# Patient Record
Sex: Male | Born: 1960 | Race: White | Hispanic: No | Marital: Single | Smoking: Current every day smoker
Health system: Southern US, Community
[De-identification: ages and names within clinical notes are randomized; demographics above are authoritative.]

## PROBLEM LIST (undated history)

## (undated) DIAGNOSIS — I10 Essential (primary) hypertension: Secondary | ICD-10-CM

## (undated) DIAGNOSIS — S069XAA Unspecified intracranial injury with loss of consciousness status unknown, initial encounter: Secondary | ICD-10-CM

## (undated) DIAGNOSIS — F319 Bipolar disorder, unspecified: Secondary | ICD-10-CM

## (undated) DIAGNOSIS — S069X9A Unspecified intracranial injury with loss of consciousness of unspecified duration, initial encounter: Secondary | ICD-10-CM

## (undated) HISTORY — PX: APPENDECTOMY: SHX54

## (undated) HISTORY — PX: HIP FRACTURE SURGERY: SHX118

---

## 2008-09-03 ENCOUNTER — Inpatient Hospital Stay (HOSPITAL_COMMUNITY): Admission: RE | Admit: 2008-09-03 | Discharge: 2008-09-10 | Payer: Self-pay | Admitting: Psychiatry

## 2008-09-03 ENCOUNTER — Ambulatory Visit: Payer: Self-pay | Admitting: Psychiatry

## 2008-12-01 ENCOUNTER — Emergency Department (HOSPITAL_COMMUNITY): Admission: EM | Admit: 2008-12-01 | Discharge: 2008-12-02 | Payer: Self-pay | Admitting: Emergency Medicine

## 2010-05-09 ENCOUNTER — Ambulatory Visit: Payer: Self-pay | Admitting: Internal Medicine

## 2010-05-09 ENCOUNTER — Encounter (INDEPENDENT_AMBULATORY_CARE_PROVIDER_SITE_OTHER): Payer: Self-pay | Admitting: Family Medicine

## 2010-05-09 LAB — CONVERTED CEMR LAB
ALT: 15 units/L (ref 0–53)
AST: 21 units/L (ref 0–37)
Albumin: 4.2 g/dL (ref 3.5–5.2)
Alkaline Phosphatase: 67 units/L (ref 39–117)
BUN: 14 mg/dL (ref 6–23)
Basophils Absolute: 0.1 10*3/uL (ref 0.0–0.1)
Basophils Relative: 1 % (ref 0–1)
CO2: 26 meq/L (ref 19–32)
Calcium: 9.5 mg/dL (ref 8.4–10.5)
Chloride: 105 meq/L (ref 96–112)
Creatinine, Ser: 0.96 mg/dL (ref 0.40–1.50)
Eosinophils Absolute: 0.2 10*3/uL (ref 0.0–0.7)
Eosinophils Relative: 3 % (ref 0–5)
Glucose, Bld: 95 mg/dL (ref 70–99)
HCT: 41.3 % (ref 39.0–52.0)
Hemoglobin: 13.9 g/dL (ref 13.0–17.0)
Lymphocytes Relative: 31 % (ref 12–46)
Lymphs Abs: 2.2 10*3/uL (ref 0.7–4.0)
MCHC: 33.7 g/dL (ref 30.0–36.0)
MCV: 99.5 fL (ref 78.0–100.0)
Monocytes Absolute: 0.6 10*3/uL (ref 0.1–1.0)
Monocytes Relative: 9 % (ref 3–12)
Neutro Abs: 3.9 10*3/uL (ref 1.7–7.7)
Neutrophils Relative %: 56 % (ref 43–77)
Platelets: 233 10*3/uL (ref 150–400)
Potassium: 4.3 meq/L (ref 3.5–5.3)
RBC: 4.15 M/uL — ABNORMAL LOW (ref 4.22–5.81)
RDW: 13.1 % (ref 11.5–15.5)
Sodium: 142 meq/L (ref 135–145)
Total Bilirubin: 0.5 mg/dL (ref 0.3–1.2)
Total Protein: 6.7 g/dL (ref 6.0–8.3)
WBC: 6.9 10*3/uL (ref 4.0–10.5)

## 2010-05-10 ENCOUNTER — Ambulatory Visit (HOSPITAL_COMMUNITY): Admission: RE | Admit: 2010-05-10 | Discharge: 2010-05-10 | Payer: Self-pay | Admitting: Family Medicine

## 2010-05-17 ENCOUNTER — Ambulatory Visit: Payer: Self-pay | Admitting: Family Medicine

## 2010-05-19 ENCOUNTER — Ambulatory Visit: Admission: RE | Admit: 2010-05-19 | Discharge: 2010-05-19 | Payer: Self-pay | Admitting: Family Medicine

## 2010-05-19 ENCOUNTER — Encounter (INDEPENDENT_AMBULATORY_CARE_PROVIDER_SITE_OTHER): Payer: Self-pay | Admitting: Family Medicine

## 2010-05-19 ENCOUNTER — Ambulatory Visit: Payer: Self-pay | Admitting: Surgery

## 2010-06-05 ENCOUNTER — Ambulatory Visit: Payer: Self-pay | Admitting: Family Medicine

## 2010-06-15 ENCOUNTER — Encounter: Admission: RE | Admit: 2010-06-15 | Discharge: 2010-07-10 | Payer: Self-pay | Admitting: Family Medicine

## 2010-06-22 ENCOUNTER — Ambulatory Visit: Payer: Self-pay | Admitting: Internal Medicine

## 2010-06-22 ENCOUNTER — Encounter (INDEPENDENT_AMBULATORY_CARE_PROVIDER_SITE_OTHER): Payer: Self-pay | Admitting: Family Medicine

## 2010-06-22 LAB — CONVERTED CEMR LAB: Microalb, Ur: 2.41 mg/dL — ABNORMAL HIGH (ref 0.00–1.89)

## 2010-08-30 ENCOUNTER — Ambulatory Visit: Payer: Self-pay | Admitting: Internal Medicine

## 2010-10-30 ENCOUNTER — Emergency Department (HOSPITAL_COMMUNITY)
Admission: EM | Admit: 2010-10-30 | Discharge: 2010-10-30 | Payer: Self-pay | Source: Home / Self Care | Admitting: Emergency Medicine

## 2010-11-23 ENCOUNTER — Encounter
Admission: RE | Admit: 2010-11-23 | Discharge: 2010-11-23 | Payer: Self-pay | Source: Home / Self Care | Attending: Otolaryngology | Admitting: Otolaryngology

## 2010-11-28 ENCOUNTER — Ambulatory Visit (HOSPITAL_COMMUNITY)
Admission: RE | Admit: 2010-11-28 | Discharge: 2010-11-28 | Payer: Self-pay | Source: Home / Self Care | Attending: Surgery | Admitting: Surgery

## 2010-11-29 LAB — DIFFERENTIAL
Basophils Absolute: 0.1 10*3/uL (ref 0.0–0.1)
Basophils Relative: 1 % (ref 0–1)
Eosinophils Absolute: 0.2 10*3/uL (ref 0.0–0.7)
Eosinophils Relative: 3 % (ref 0–5)
Lymphocytes Relative: 33 % (ref 12–46)
Lymphs Abs: 2.5 10*3/uL (ref 0.7–4.0)
Monocytes Absolute: 0.6 10*3/uL (ref 0.1–1.0)
Monocytes Relative: 8 % (ref 3–12)
Neutro Abs: 4.2 10*3/uL (ref 1.7–7.7)
Neutrophils Relative %: 55 % (ref 43–77)

## 2010-11-29 LAB — BASIC METABOLIC PANEL
BUN: 13 mg/dL (ref 6–23)
CO2: 26 mEq/L (ref 19–32)
Calcium: 9.7 mg/dL (ref 8.4–10.5)
Chloride: 103 mEq/L (ref 96–112)
Creatinine, Ser: 0.84 mg/dL (ref 0.4–1.5)
GFR calc Af Amer: >60 mL/min (ref >60)
GFR calc non Af Amer: >60 mL/min (ref >60)
Glucose, Bld: 89 mg/dL (ref 70–99)
Potassium: 5.2 mEq/L — ABNORMAL HIGH (ref 3.5–5.1)
Sodium: 137 mEq/L (ref 135–145)

## 2010-11-29 LAB — CBC
HCT: 43.9 % (ref 39.0–52.0)
Hemoglobin: 14.9 g/dL (ref 13.0–17.0)
MCH: 33.3 pg (ref 26.0–34.0)
MCHC: 33.9 g/dL (ref 30.0–36.0)
MCV: 98 fL (ref 78.0–100.0)
Platelets: 243 10*3/uL (ref 150–400)
RBC: 4.48 MIL/uL (ref 4.22–5.81)
RDW: 13.2 % (ref 11.5–15.5)
WBC: 7.7 10*3/uL (ref 4.0–10.5)

## 2010-11-29 LAB — HEPATIC FUNCTION PANEL
ALT: 18 U/L (ref 0–53)
AST: 17 U/L (ref 0–37)
Albumin: 4.1 g/dL (ref 3.5–5.2)
Alkaline Phosphatase: 54 U/L (ref 39–117)
Bilirubin, Direct: <0.1 mg/dL (ref 0.0–0.3)
Total Bilirubin: 0.4 mg/dL (ref 0.3–1.2)
Total Protein: 6.9 g/dL (ref 6.0–8.3)

## 2010-11-29 LAB — SURGICAL PCR SCREEN
MRSA, PCR: NEGATIVE
Staphylococcus aureus: NEGATIVE

## 2010-12-15 ENCOUNTER — Ambulatory Visit (HOSPITAL_COMMUNITY)
Admission: RE | Admit: 2010-12-15 | Discharge: 2010-12-15 | Disposition: A | Discharge status: Home / Self Care | Payer: Medicaid Other | Attending: Surgery | Admitting: Surgery

## 2010-12-15 ENCOUNTER — Other Ambulatory Visit: Payer: Self-pay | Admitting: Surgery

## 2010-12-15 ENCOUNTER — Ambulatory Visit (HOSPITAL_COMMUNITY)
Admission: RE | Admit: 2010-12-15 | Discharge: 2010-12-15 | Disposition: A | Discharge status: Home / Self Care | Payer: Medicaid Other | Source: Ambulatory Visit | Attending: Surgery | Admitting: Surgery

## 2010-12-15 DIAGNOSIS — K4090 Unilateral inguinal hernia, without obstruction or gangrene, not specified as recurrent: Secondary | ICD-10-CM | POA: Insufficient documentation

## 2010-12-15 DIAGNOSIS — R229 Localized swelling, mass and lump, unspecified: Secondary | ICD-10-CM | POA: Insufficient documentation

## 2010-12-15 DIAGNOSIS — L723 Sebaceous cyst: Secondary | ICD-10-CM | POA: Insufficient documentation

## 2010-12-15 DIAGNOSIS — F17200 Nicotine dependence, unspecified, uncomplicated: Secondary | ICD-10-CM | POA: Insufficient documentation

## 2010-12-15 DIAGNOSIS — F319 Bipolar disorder, unspecified: Secondary | ICD-10-CM | POA: Insufficient documentation

## 2010-12-15 LAB — RAPID URINE DRUG SCREEN, HOSP PERFORMED
Amphetamines: NOT DETECTED
Barbiturates: NOT DETECTED
Benzodiazepines: NOT DETECTED
Cocaine: NOT DETECTED
Opiates: NOT DETECTED
Tetrahydrocannabinol: POSITIVE — AB

## 2010-12-19 NOTE — Op Note (Signed)
NAME:  Johnny Banks, Johnny Banks NO.:  [id number]  MEDICAL RECORD NO.:  [id number]           PATIENT TYPE:  O  LOCATION:  DAYL                         FACILITY:  [facility]  PHYSICIAN:  Ardeth Sportsman, MD     DATE OF BIRTH:  [birth year]  DATE OF PROCEDURE:  12/15/2010 DATE OF DISCHARGE:                              OPERATIVE REPORT   PRIMARY CARE PHYSICIAN:  Over at [facility], who I believe is Dr. Syliva Overman.  SURGEON:  Ardeth Sportsman, MD  ASSISTANTS:  RN.  PREOPERATIVE DIAGNOSES: 1. Recurrent enlarged back mass,  question sebaceous cyst versus     lipoma. 2. Right inguinal hernia. 3. Possible left inguinal hernia.  POSTOPERATIVE DIAGNOSES: 1. Recurrent back mass, cystic versus sebaceous cyst. 2. Bilateral indirect inguinal hernia.  PROCEDURE PERFORMED: 1. Excision of back mass with closure. 2. Laparoscopic bilateral inguinal hernia repair with mesh.  ANESTHESIA: 1. General anesthesia. 2. Local anesthetic and field block around the back incision site and     port sites. 3. Bilateral ilioinguinal/genitofemoral/spermatic cord nerve block.  SPECIMENS:  Back mass.  DRAINS:  None.  ESTIMATED BLOOD LOSS:  50 mL.  COMPLICATIONS:  None major.  INDICATIONS:  The patient is a 50 year old male.  He was noted to have an incidental right inguinal hernia on a screening physical exam.  It was not  particularly bothersome; however, over the past few months, it has become symptomatic.  He feels like it got larger  as well.  The patient was sent to me for surgical evaluation.  I suspect that he had a left inguinal hernia in addition to the obvious right inguinal hernia and the anatomy and embryology of abdominal inflammation and testicular migration was explained.  Pathophysiology of herniation with its natural history and risks were discussed.  Options discussed. Recommendations made for laparoscopic bilateral inguinal exploration with repair with mesh .  Risks,  benefits, and alternatives were discussed.  The patient also notes he has had a mass on his back that drained  back in the 1990s.  It calmed down.  However, he notes,  it has increased in size and become uncomfortable.  He has not had any recent infections. He wondered about __________.  I suspect he had a recurrent, large sebaceous cyst versus a lipoma.  Technique of excision, increased risk of infection, benefits, and alternatives were discussed.  Questions were answered and the patient agreed to proceed.  OPERATIVE FINDINGS:  He had a 5 x 3 x 3 cm multiloculated cystic mass consistent with a large sebaceous cyst, it involved the skin and dermis, and it was not actively infected.  He had bilateral indirect inguinal  hernia with moderate-sized cord lipomas on the right, but much smaller on the left.  There were no direct femoral or obturator defects.  DESCRIPTION OF THE PROCEDURE:  Informed consent was confirmed.  The patient underwent general anesthesia without any difficulty.  He voided just prior coming to the operating room.  He received IV cefazolin  and gentamicin given the __________.  He initially was positioned right side decubitus.  His back was prepped  and draped in a sterile fashion. Surgical time-out confirmed our plan.  I made an elliptical excision over the mass  transversely.  I tried to spare  some skin, but the mass was definitely  adhered  into the dermal tissues.  Therefore, I had to extend the incision with a wider.  The resulting  wound was 7 x 4 cm.  I had to make somewhat  of a sigmoid shape to it  given that it was not completely transversed, but somewhat oblique and the mass was quite dense onto the skin.  I did  remove it intact.  I got around it cleanly. I ensured hemostasis.  I closed the wound with 3-0 Vicryl interrupted deep dermal stitches and 4-0 Monocryl running stitch with following Steri-Strips.  Sterile dressings applied.  The patient was  rolled supine with arms tucked.  His abdomen and mons pubis were clipped,  prepped and draped in a sterile fashion.  Surgical time-out confirmed our plan.  I placed a 12-mm Hasson port in the preperitoneal plane just to left of midline, underneath the rectus muscle.  I used carbon dioxide insufflation into the preperitoneal space.  I used a camera to free the peritoneum off bilateral lower quadrants.  I created enough working space such as 5 mm port can be placed in the right and left mid abdomen.  We turned our attention towards the right lower quadrant since that was the obvious hernia side.  I freed the peritoneum off laterally  towards the right flank.  I freed it up to the arcuate ligament and actually I freed a little bit of that off just to get a little bit higher and mid peritoneum freed off as well.  I freed the peritoneum down to the pubic rim.  I made a window between the right anterolateral bladder and the true right pelvis.  I could see some of the peritoneum going into a dilated internal ring consistent with an indirect inguinal hernia.  I skeletonized this peritoneal hernia sac and freed that off the spermatic cord vessels.  He had some moderate-sized cord lipomas associated, which I was able to remove and morcellate, and freed it off, was somewhat adherent and the right spermatic cord got little more skeletonized on the venous vessels, but little bit oozy at the side branches.  However, with pressure, time and focus touch point cautery, hemostasis was excellent.  I freed the peritoneum off vas deferens as well.  Next, I carried out dissection on the left side in a mirror-image fashion.  He had more subtle defect on the left side in a mirror-image fashion.  He had a more subtle defect on the left side but he did have a definite indirect defect.  I did camera inspection and saw no evidence of the femoral or obturator foramen or hernia defects.  The direct space was  clean.  I placed 15 x 15 cm ultra-lightweight polypropylene (Ultrapro) mesh on each side.  I cut a slid in the mesh such that a 6 x 6 cm mesh corner was tucked between the lateral pelvic side wall and the lateral bladder. The mesh was laid like overlapping diamonds with the medial corners overlapping across the midline near the superior rami pubis.  The mesh laid laterally and superiorly as well such that it had at least 3 inches circumferential cover around the both internal rings.  The direct space in femoral and obturator foramen were covered with the mesh as well. Because they were not  particularly large hernias and the mesh laid well, I did not had any tacks.  I grabbed the hernia sac and elevated it cephalad.  I tucked the corners of the mesh, especially the most inferior corner down on the psoas and evacuated capnoperitoneum ports.  I closed the infraumbilical  fascial defect using 0 Vicryl stitch.  Skin was closed.  Sterile dressings were applied.  The patient was being extubated and sent to recovery room.  He has had difficulty getting a ride home as his first option fell through.  I have no family members to discuss with him.  We will monitor and see if he is stable.  Depending on availability of his ride, he may get stay overnight, but hopefully he could go home later tonight as long as he is comfortable.  I did discuss postop care with him in the office and again with him just prior to surgery.  I have written instructions as well.     Ardeth Sportsman, MD     SCG/MEDQ  D:  12/15/2010  T:  12/16/2010  Job:  [id number]  Electronically Signed by Karie Soda MD on 12/19/2010 10:43:36 AM

## 2011-02-26 LAB — URINALYSIS, ROUTINE W REFLEX MICROSCOPIC
Bilirubin Urine: NEGATIVE
Glucose, UA: NEGATIVE mg/dL
Hgb urine dipstick: NEGATIVE
Ketones, ur: NEGATIVE mg/dL
Nitrite: NEGATIVE
Protein, ur: NEGATIVE mg/dL
Specific Gravity, Urine: 1.021 (ref 1.005–1.030)
Urobilinogen, UA: 0.2 mg/dL (ref 0.0–1.0)
pH: 5.5 (ref 5.0–8.0)

## 2011-02-26 LAB — COMPREHENSIVE METABOLIC PANEL
ALT: 27 U/L (ref 0–53)
AST: 28 U/L (ref 0–37)
Albumin: 3.9 g/dL (ref 3.5–5.2)
Alkaline Phosphatase: 62 U/L (ref 39–117)
BUN: 11 mg/dL (ref 6–23)
CO2: 22 mEq/L (ref 19–32)
Calcium: 8.7 mg/dL (ref 8.4–10.5)
Chloride: 106 mEq/L (ref 96–112)
Creatinine, Ser: 0.8 mg/dL (ref 0.4–1.5)
GFR calc Af Amer: >60 mL/min (ref >60)
GFR calc non Af Amer: >60 mL/min (ref >60)
Glucose, Bld: 120 mg/dL — ABNORMAL HIGH (ref 70–99)
Potassium: 3.3 mEq/L — ABNORMAL LOW (ref 3.5–5.1)
Sodium: 140 mEq/L (ref 135–145)
Total Bilirubin: 0.8 mg/dL (ref 0.3–1.2)
Total Protein: 7.1 g/dL (ref 6.0–8.3)

## 2011-02-26 LAB — RAPID URINE DRUG SCREEN, HOSP PERFORMED
Amphetamines: NOT DETECTED
Barbiturates: NOT DETECTED
Benzodiazepines: NOT DETECTED
Cocaine: NOT DETECTED
Opiates: NOT DETECTED
Tetrahydrocannabinol: POSITIVE — AB

## 2011-02-26 LAB — DIFFERENTIAL
Basophils Absolute: 0.1 10*3/uL (ref 0.0–0.1)
Basophils Relative: 1 % (ref 0–1)
Eosinophils Absolute: 0.2 10*3/uL (ref 0.0–0.7)
Eosinophils Relative: 3 % (ref 0–5)
Lymphocytes Relative: 43 % (ref 12–46)
Lymphs Abs: 3.2 10*3/uL (ref 0.7–4.0)
Monocytes Absolute: 0.6 10*3/uL (ref 0.1–1.0)
Monocytes Relative: 8 % (ref 3–12)
Neutro Abs: 3.3 10*3/uL (ref 1.7–7.7)
Neutrophils Relative %: 45 % (ref 43–77)

## 2011-02-26 LAB — CBC
HCT: 44 % (ref 39.0–52.0)
Hemoglobin: 15.3 g/dL (ref 13.0–17.0)
MCHC: 34.8 g/dL (ref 30.0–36.0)
MCV: 93.9 fL (ref 78.0–100.0)
Platelets: 247 10*3/uL (ref 150–400)
RBC: 4.69 MIL/uL (ref 4.22–5.81)
RDW: 14.2 % (ref 11.5–15.5)
WBC: 7.5 10*3/uL (ref 4.0–10.5)

## 2011-02-26 LAB — LIPASE, BLOOD: Lipase: 24 U/L (ref 11–59)

## 2011-02-26 LAB — ETHANOL: Alcohol, Ethyl (B): 117 mg/dL — ABNORMAL HIGH (ref 0–10)

## 2011-03-27 ENCOUNTER — Emergency Department (HOSPITAL_COMMUNITY)
Admission: EM | Admit: 2011-03-27 | Discharge: 2011-03-27 | Disposition: A | Discharge status: Home / Self Care | Payer: Medicaid Other | Attending: Emergency Medicine | Admitting: Emergency Medicine

## 2011-03-27 ENCOUNTER — Emergency Department (HOSPITAL_COMMUNITY): Payer: Medicaid Other

## 2011-03-27 DIAGNOSIS — S61209A Unspecified open wound of unspecified finger without damage to nail, initial encounter: Secondary | ICD-10-CM | POA: Insufficient documentation

## 2011-03-27 DIAGNOSIS — W261XXA Contact with sword or dagger, initial encounter: Secondary | ICD-10-CM | POA: Insufficient documentation

## 2011-03-27 DIAGNOSIS — Y92009 Unspecified place in unspecified non-institutional (private) residence as the place of occurrence of the external cause: Secondary | ICD-10-CM | POA: Insufficient documentation

## 2011-03-27 DIAGNOSIS — F411 Generalized anxiety disorder: Secondary | ICD-10-CM | POA: Insufficient documentation

## 2011-03-27 DIAGNOSIS — Y998 Other external cause status: Secondary | ICD-10-CM | POA: Insufficient documentation

## 2011-03-27 DIAGNOSIS — W260XXA Contact with knife, initial encounter: Secondary | ICD-10-CM | POA: Insufficient documentation

## 2011-03-27 DIAGNOSIS — F3289 Other specified depressive episodes: Secondary | ICD-10-CM | POA: Insufficient documentation

## 2011-03-27 DIAGNOSIS — F602 Antisocial personality disorder: Secondary | ICD-10-CM | POA: Insufficient documentation

## 2011-03-27 DIAGNOSIS — F329 Major depressive disorder, single episode, unspecified: Secondary | ICD-10-CM | POA: Insufficient documentation

## 2011-03-27 DIAGNOSIS — Z96649 Presence of unspecified artificial hip joint: Secondary | ICD-10-CM | POA: Insufficient documentation

## 2011-03-27 DIAGNOSIS — F429 Obsessive-compulsive disorder, unspecified: Secondary | ICD-10-CM | POA: Insufficient documentation

## 2011-03-27 NOTE — H&P (Signed)
NAME:  Johnny Banks, Johnny Banks NO.:  [id number]   MEDICAL RECORD NO.:  [id number]          PATIENT TYPE:  IPS   LOCATION:  0503                          [facility]ACILITY:  [facility]   PHYSICIAN:  Vic Ripper, P.A.-C.DATE OF BIRTH:  [birth year]   DATE OF ADMISSION:  09/03/2008  DATE OF DISCHARGE:                       PSYCHIATRIC ADMISSION ASSESSMENT   HISTORY OF PRESENT ILLNESS:  This is a 50 year old single white male.  He presented to [facility] yesterday.  He reported  that he was suicidal with a plan to drink himself to death.  He had been  sober for the past 60 days.  He relapsed yesterday after getting into an  argument with his girlfriend.  He reported that he was depressed,  hopeless and helpless.  He has attended numerous rehab centers  throughout his lifetime.  The most recent being in August 2009, he  attended [facility] program through the [facility].  Today, he reports that he is no longer suicidal.  He is upset with his  girlfriend as she no longer has time for him.  He states that originally  they were all over each other, and now she does not have time for him.   PAST PSYCHIATRIC HISTORY:  As already stated, he has had numerous prior  rehabs.  He has taken antidepressants for years according to him.  He  last had rehab at [facility] through [facility] in August 2009.   SOCIAL HISTORY:  He is a high Garment/textile technologist in 1978.  He has never  married.  He has no children.  He is currently employed as a  Designer, multimedia.   FAMILY HISTORY:  He states his father and brother both have alcohol  issues.  However, he has not spoken to them in at least 10 years.  His  own alcohol and drug history reports having been sober for 60 days, but  he relapsed yesterday on alcohol.   PRIMARY CARE PHYSICIAN:  He does not have a PCC.  He has no  psychiatrist.   PAST MEDICAL HISTORY:  He is known to have high cholesterol.   MEDICATIONS:  He is  currently prescribed:  1. Paxil 40 mg p.o. daily.  2. Wellbutrin SR 150 mg p.o. a.m. and noon.  3. Zestril 10 mg p.o. daily.  4. Trazodone 100 mg q.h.s.   ALLERGIES:  NO KNOWN DRUG ALLERGIES.   POSITIVE PHYSICAL FINDINGS:  We are waiting on some of his lab results,  specifically a UDS and a UA.  His WBC was elevated at 13.0 on his CBC,  and he states he is recovering from a cold, upper respiratory infection.  The remainder of his physical examination is unremarkable.  He is a well-  developed, well-nourished white male who appears his stated age.  VITAL SIGNS:  On admission, shows he is 6 feet 1 inches, weight 244,  temperature 98, blood pressure 87/58 to 97/62, pulse 80-92, respirations  18.  He does have a scar in his right lower quadrant and consistent with  his prior history for an appendectomy, and he has a  cyst in his mid  upper back.   MENTAL STATUS EXAM:  Today he was examined in his room.  He is alert and  oriented.  His speech is normal rate, rhythm and tone.  His mood is  appropriate to the situation.  His affect is congruent.  Thought  processes are clear, rational and goal oriented.  He wants to get detox  so he can keep his appointment.  Judgment and insight are fair.  Concentration memory are intact.  Intelligence is average.  He denies  any suicidal or homicidal.  He denies auditory visual hallucinations.   DIAGNOSES:  AXIS I:  Alcohol abuse, major depressive disorder, recurrent  severe.  AXIS II:  Personality disorder, NOS.  AXIS III:  High cholesterol.  AXIS IV:  Issues with alcohol and problems with primary support group.  AXIS V:  30.   PLAN:  Admit for safety and stabilization.  We will help him detox,  utilizing Librium 25 mg q.6 h p.r.n. for 48 hours, and estimated length  of stay is 48 hours.  He can continue follow up with the [facility].      Vic Ripper, P.A.-C.     MD/MEDQ  D:  09/04/2008  T:  09/04/2008  Job:  [id number]

## 2011-03-30 NOTE — Discharge Summary (Signed)
NAME:  Johnny Banks, Johnny Banks NO.:  [id number]   MEDICAL RECORD NO.:  [id number]          PATIENT TYPE:  IPS   LOCATION:  0503                          FACILITY:  [facility]   PHYSICIAN:  Geoffery Lyons, M.D.      DATE OF BIRTH:  February 20, 1961   DATE OF ADMISSION:  09/03/2008  DATE OF DISCHARGE:  09/10/2008                               DISCHARGE SUMMARY   CHIEF COMPLAINT AND PRESENT ILLNESS:  This was the first admission to  [facility] for this 50 year old single white male,  presented to here for [facility] Report he was suicidal with a  plan to drink himself to death.  Had been sober for the past 16 days.  Relapsed after getting into an argument with his girlfriend.  Reported  he was depressed, hopeless, helpless, attended numerous rehab centers  throughout his lifetime.  Was re-seen in August 2009, he had attended  __________.  At the time of this assessment endorsed that he was no  longer suicidal, upset with his girlfriend as she no longer has time for  him.  Originally they were all over each other and now she does not have  time.   PAST PSYCHIATRIC HISTORY:  Numerous prior rehabs, has taken  antidepressants for years.  Had been in rehab in __________ in August of  2009.   SECONDARY HISTORY:  As already stated, history of persistent alcohol use  with some sobriety in between.   MEDICAL HISTORY:  Hypercholesterolemia.   MEDICATIONS:  1. Paxil 40 mg per day.  2. Wellbutrin SR 150 mg twice a day.  3. Zestril 10 mg per day.  4. Trazodone 100 mg at bedtime.   PHYSICAL EXAMINATION:  Failed to show any acute findings.   LABORATORY WORKUP:  White blood cells 13, hemoglobin 15.1, sodium 137,  potassium 4.3, glucose 104, SGOT 19, SGPT 14, total bilirubin 0.6.  Drug  screen negative for substances of abuse.   MENTAL STATUS EXAM:  Reveals an alert, cooperative male.  Speech normal  in rate, rhythm and tone.  Mood is anxious, affect is anxious,  thought  process logical, coherent and relevant.  Wants to get detox, wants to  work on getting back in a relationship with girlfriend, wants to go back  into halfway house.  No suicidal or homicidal ideas, no hallucinations  or delusions.  Cognition well-preserved.   ADMISSION DIAGNOSES:  AXIS I:  Alcohol dependence, depressive disorder  not otherwise specified.  AXIS II:  No diagnosis.  AXIS III:  Hypercholesterolemia.  AXIS IV:  Moderate.  AXIS V:  Upon Admission 30 - 35, high Global Assessment of Functioning  in the last year 60.   COURSE IN THE HOSPITAL:  Was admitted, started individual and group  psychotherapy.  As already stated he was living in a recovery house 2 -  1/2 months.  Endorsed things were going smooth.  There was conflict with  the girlfriend, she is also in recovery.  He met her at __________ .  She stopped going to AA and he felt that he was  put on the back burner.  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  He had been in  6 different rehabs and 6 different recovery houses.  October 25 he was  trying to interact with the girlfriend but unable to do so, trying to  get someone in the house to see if he is able to go back to stay in the  recovery house after having relapsed.  Wanted to be able to go to an  appointment with vocational rehab, that he was that he had counseled  several times.  Felt that he was not going to be serviced if he was not  there the following day.  He endorsed no cravings and endorsed that  drinking is the last thing in his mind.  October the 26, able to share a  long history of anger and mood swings, now dealing with uncertainty of  having a place to go, uncertainty in terms of the relationship with the  girlfriend, not sure what will happen.  He perceived mixed messages  coming  from her, endorsed he was really committed to the relationship  and needed a commitment from her but  as of now does not feel that he  has that commitment.  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  October 27 he was easily agitated, decided to end the relationship.  Endorsed that he would not be able to be in a relationship with her if  they were not going to be a couple.  Continued to work on letting go of  the relationship with the girlfriend, still there he expressed some hope  but she did not return calls or came to see him as he expected.  October  29 he was endorsing persistent worries, ruminations and it is clear that  he could not take benzodiazepines.  We discussed options, so we gave him  a trial to Seroquel and Neurontin.  By October 30 he was in full contact  with reality.  There were no active suicidal or homicidal ideas, no  hallucinations or delusions, had tolerated the medications well.  He was  he was accepted back to the recovery house and he was not going to  pursue a relationship with the girlfriend.   DISCHARGE DIAGNOSIS:  AXIS I:  Alcohol dependence, cocaine abuse, mood  disorder NOS.  AXIS II:  No diagnosis.  AXIS III:  Hypercholesterolemia.  AXIS IV:  Moderate.  AXIS V:  Upon discharge 50 - 55.   Discharged on:  1. Wellbutrin SR 150 twice a day.  2. Prinivil 10 mg per day.  3. Paxil 20 mg two daily.  4. Neurontin 100 mg two 3 times a day.  5. Seroquel 50 one 3 times a day.  6. Trazodone 100 at bedtime.   FOLLOW-UP:  Dr. __________ at [facility].      Geoffery Lyons, M.D.  Electronically Signed     IL/MEDQ  D:  10/08/2008  T:  10/08/2008  Job:  [id number]

## 2011-08-14 LAB — COMPREHENSIVE METABOLIC PANEL
ALT: 14
AST: 19
Albumin: 4
Alkaline Phosphatase: 71
BUN: 15
CO2: 27
Calcium: 9
Chloride: 104
Creatinine, Ser: 0.93
GFR calc Af Amer: >60
GFR calc non Af Amer: >60
Glucose, Bld: 104 — ABNORMAL HIGH
Potassium: 4.3
Sodium: 137
Total Bilirubin: 0.6
Total Protein: 7.3

## 2011-08-14 LAB — URINALYSIS, ROUTINE W REFLEX MICROSCOPIC
Bilirubin Urine: NEGATIVE
Glucose, UA: NEGATIVE
Hgb urine dipstick: NEGATIVE
Ketones, ur: NEGATIVE
Nitrite: NEGATIVE
Protein, ur: NEGATIVE
Specific Gravity, Urine: 1.021
Urobilinogen, UA: 0.2
pH: 6

## 2011-08-14 LAB — DRUGS OF ABUSE SCREEN W/O ALC, ROUTINE URINE
Amphetamine Screen, Ur: NEGATIVE
Barbiturate Quant, Ur: NEGATIVE
Benzodiazepines.: NEGATIVE
Cocaine Metabolites: NEGATIVE
Creatinine,U: 170.7
Marijuana Metabolite: NEGATIVE
Methadone: NEGATIVE
Opiate Screen, Urine: NEGATIVE
Phencyclidine (PCP): NEGATIVE
Propoxyphene: NEGATIVE

## 2011-08-14 LAB — CBC
HCT: 44.8
Hemoglobin: 15.1
MCHC: 33.7
MCV: 94.8
Platelets: 253
RBC: 4.73
RDW: 12.8
WBC: 13 — ABNORMAL HIGH

## 2012-01-16 ENCOUNTER — Other Ambulatory Visit: Payer: Self-pay | Admitting: Otolaryngology

## 2012-01-16 DIAGNOSIS — H6620 Chronic atticoantral suppurative otitis media, unspecified ear: Secondary | ICD-10-CM

## 2012-01-18 ENCOUNTER — Other Ambulatory Visit: Payer: Medicaid Other

## 2012-01-23 ENCOUNTER — Ambulatory Visit
Admission: RE | Admit: 2012-01-23 | Discharge: 2012-01-23 | Disposition: A | Discharge status: Home / Self Care | Payer: Medicaid Other | Source: Ambulatory Visit | Attending: Otolaryngology | Admitting: Otolaryngology

## 2012-01-23 DIAGNOSIS — H6620 Chronic atticoantral suppurative otitis media, unspecified ear: Secondary | ICD-10-CM

## 2012-04-01 ENCOUNTER — Emergency Department (HOSPITAL_COMMUNITY)
Admission: EM | Admit: 2012-04-01 | Discharge: 2012-04-01 | Disposition: A | Discharge status: Home / Self Care | Payer: Medicaid Other | Attending: Emergency Medicine | Admitting: Emergency Medicine

## 2012-04-01 ENCOUNTER — Encounter (HOSPITAL_COMMUNITY): Payer: Self-pay | Admitting: *Deleted

## 2012-04-01 DIAGNOSIS — Z760 Encounter for issue of repeat prescription: Secondary | ICD-10-CM

## 2012-04-01 DIAGNOSIS — F319 Bipolar disorder, unspecified: Secondary | ICD-10-CM | POA: Insufficient documentation

## 2012-04-01 HISTORY — DX: Unspecified intracranial injury with loss of consciousness status unknown, initial encounter: S06.9XAA

## 2012-04-01 HISTORY — DX: Unspecified intracranial injury with loss of consciousness of unspecified duration, initial encounter: S06.9X9A

## 2012-04-01 HISTORY — DX: Bipolar disorder, unspecified: F31.9

## 2012-04-01 MED ORDER — PAROXETINE HCL ER 25 MG PO TB24: 25.0000 mg | ORAL_TABLET | ORAL | Status: DC

## 2012-04-01 MED ORDER — QUETIAPINE FUMARATE 100 MG PO TABS: 100.0000 mg | ORAL_TABLET | Freq: Every day | ORAL | Status: DC

## 2012-04-01 NOTE — ED Provider Notes (Signed)
History     CSN: [id number]  Arrival date & time 04/01/12  1334   First MD Initiated Contact with Patient 04/01/12 1343      Chief Complaint  Patient presents with  . Medication Refill  . Medical Clearance     The history is provided by the patient.   the patient reports his been out of his medications for his psychiatric illness x3 days.  He reports he was at the pharmacy trying to get his medications refilled and that he in the pharmacist begin [employer] he meant and [facility] was called to pick him up.  He brought him to the emergency department so that he get prescriptions refilled.  The patient does report he is $6 in his wallet which will allow him to pay his Medicaid to pay for his prescriptions.  He simply has been unable to go see a psychiatrist secondary to calling his psychiatrist money.  He denies homicidal and suicidal thoughts  Past Medical History  Diagnosis Date  . Bipolar 1 disorder   . TBI (traumatic brain injury)     History reviewed. No pertinent past surgical history.  History reviewed. No pertinent family history.  History  Substance Use Topics  . Smoking status: Current Everyday Smoker  . Smokeless tobacco: Not on file  . Alcohol Use: Yes      Review of Systems  All other systems reviewed and are negative.    Allergies  Review of patient's allergies indicates no known allergies.  Home Medications   Current Outpatient Rx  Name Route Sig Dispense Refill  . PAROXETINE HCL ER 25 MG PO TB24 Oral Take 1 tablet (25 mg total) by mouth every morning. 30 tablet 1  . QUETIAPINE FUMARATE 100 MG PO TABS Oral Take 1 tablet (100 mg total) by mouth at bedtime. 30 tablet 1    BP 137/104  Pulse 98  Temp(Src) 98.5 F (36.9 C) (Oral)  Resp 20  SpO2 98%  Physical Exam  Nursing note and vitals reviewed. Constitutional: He is oriented to person, place, and time. He appears well-developed and well-nourished.  HENT:  Head: Normocephalic.    Eyes: EOM are normal.  Neck: Normal range of motion.  Pulmonary/Chest: Effort normal.  Musculoskeletal: Normal range of motion.  Neurological: He is alert and oriented to person, place, and time.  Psychiatric: He has a normal mood and affect. His behavior is normal. Judgment and thought content normal.    ED Course  Procedures (including critical care time)  Labs Reviewed - No data to display No results found.   1. Medication refill       MDM  The patient does not appear to be a threat to himself or to others.  Medication refill was prescribed.  The patient will go and get his meds filled.  He has the $6 for his co-pay.  He is encouraged return to the ER as needed        Lyanne Co, MD 04/01/12 1401

## 2012-04-01 NOTE — Discharge Instructions (Signed)
Medication Refill, Emergency Department  We have refilled your medication today as a courtesy to you. It is best for your medical care, however, to take care of getting refills done through your primary caregiver's office. They have your records and can do a better job of follow-up than we can in the emergency department.  On maintenance medications, we often only prescribe enough medications to get you by until you are able to see your regular caregiver. This is a more expensive way to refill medications.  In the future, please plan for refills so that you will not have to use the emergency department for this.  Thank you for your help. Your help allows us to better take care of the daily emergencies that enter our department.  Document Released: 02/15/2004 Document Revised: 10/18/2011 Document Reviewed: 10/29/2005  Exit[employer] Patient Information 2012 [employer].

## 2012-04-01 NOTE — ED Notes (Signed)
Pt in with [facility] stating he has been off his medication since last Wednesday, states he is unable to get refills, admits to hallucinations and thoughts of hurting self but no desire to do anything, states he can't control his thoughts without his medication and he is seeking assistance with getting his refills, states he does not want inpatient placement

## 2012-05-30 ENCOUNTER — Emergency Department (HOSPITAL_COMMUNITY)
Admission: EM | Admit: 2012-05-30 | Discharge: 2012-05-30 | Disposition: A | Discharge status: Home / Self Care | Payer: Medicaid Other | Attending: Emergency Medicine | Admitting: Emergency Medicine

## 2012-05-30 ENCOUNTER — Emergency Department (HOSPITAL_COMMUNITY): Payer: Medicaid Other

## 2012-05-30 ENCOUNTER — Encounter (HOSPITAL_COMMUNITY): Payer: Self-pay | Admitting: Emergency Medicine

## 2012-05-30 DIAGNOSIS — F17200 Nicotine dependence, unspecified, uncomplicated: Secondary | ICD-10-CM | POA: Insufficient documentation

## 2012-05-30 DIAGNOSIS — R071 Chest pain on breathing: Secondary | ICD-10-CM | POA: Insufficient documentation

## 2012-05-30 DIAGNOSIS — I10 Essential (primary) hypertension: Secondary | ICD-10-CM | POA: Insufficient documentation

## 2012-05-30 DIAGNOSIS — R0789 Other chest pain: Secondary | ICD-10-CM

## 2012-05-30 DIAGNOSIS — F319 Bipolar disorder, unspecified: Secondary | ICD-10-CM | POA: Insufficient documentation

## 2012-05-30 HISTORY — DX: Essential (primary) hypertension: I10

## 2012-05-30 LAB — ETHANOL: Alcohol, Ethyl (B): <11 mg/dL (ref 0–11)

## 2012-05-30 LAB — CBC WITH DIFFERENTIAL/PLATELET
Eosinophils Absolute: 0.1 10*3/uL (ref 0.0–0.7)
Eosinophils Relative: 1 % (ref 0–5)
Lymphs Abs: 1.6 10*3/uL (ref 0.7–4.0)
MCH: 35.1 pg — ABNORMAL HIGH (ref 26.0–34.0)
MCV: 101.5 fL — ABNORMAL HIGH (ref 78.0–100.0)
Platelets: 154 10*3/uL (ref 150–400)
RBC: 4.56 MIL/uL (ref 4.22–5.81)

## 2012-05-30 LAB — COMPREHENSIVE METABOLIC PANEL
ALT: 33 U/L (ref 0–53)
BUN: 11 mg/dL (ref 6–23)
Calcium: 9.6 mg/dL (ref 8.4–10.5)
Creatinine, Ser: 0.79 mg/dL (ref 0.50–1.35)
GFR calc Af Amer: >90 mL/min (ref >90)
Glucose, Bld: 101 mg/dL — ABNORMAL HIGH (ref 70–99)
Sodium: 134 mEq/L — ABNORMAL LOW (ref 135–145)
Total Protein: 7.9 g/dL (ref 6.0–8.3)

## 2012-05-30 LAB — PROTIME-INR
INR: 1.01 (ref 0.00–1.49)
Prothrombin Time: 13.5 seconds (ref 11.6–15.2)

## 2012-05-30 LAB — RAPID URINE DRUG SCREEN, HOSP PERFORMED: Opiates: NOT DETECTED

## 2012-05-30 MED ORDER — HYDROCODONE-ACETAMINOPHEN 5-325 MG PO TABS
2.0000 | ORAL_TABLET | Freq: Once | ORAL | Status: AC
  Administered 2012-05-30: 2 via ORAL
  Filled 2012-05-30: qty 2

## 2012-05-30 MED ORDER — POTASSIUM CHLORIDE CRYS ER 20 MEQ PO TBCR
40.0000 meq | EXTENDED_RELEASE_TABLET | Freq: Once | ORAL | Status: AC
  Administered 2012-05-30: 40 meq via ORAL
  Filled 2012-05-30: qty 2

## 2012-05-30 MED ORDER — HYDROCODONE-ACETAMINOPHEN 5-500 MG PO TABS: 1.0000 | ORAL_TABLET | Freq: Four times a day (QID) | ORAL | Status: AC | PRN

## 2012-05-30 NOTE — ED Notes (Signed)
Awaiting d-dimer

## 2012-05-30 NOTE — ED Provider Notes (Addendum)
History     CSN: [id number]  Arrival date & time 05/30/12  1312   First MD Initiated Contact with Patient 05/30/12 1410      Chief Complaint  Patient presents with  . Rib pain     (Consider location/radiation/quality/duration/timing/severity/associated sxs/prior treatment) The history is provided by the patient.  pt c/o pain to left lower ribs/chest for 2 days. Constant, dull pain sharp w movement/turning or palpation of area. States has chronic 'smokers' cough, possibly coughing sl more than normal. States w cough, will get sharp jolt of localized pain to area. States had recently been drinking heavily (prior to pain) and states is unsure if fell onto area. Denies any other pain or injury. Denies hx cad or fam hx cad. Denies any other recent cp or discomfort even w exertion. w pain, no assoc nv, diaphoresis or sob. Denies fever or chills. No leg pain or swelling, no hx dvt or pe. No recent immobility, travel or surgery. No hemoptysis. Denies abdominal pain, no nv.     Past Medical History  Diagnosis Date  . Bipolar 1 disorder   . TBI (traumatic brain injury)   . Hypertension     History reviewed. No pertinent past surgical history.  History reviewed. No pertinent family history.  History  Substance Use Topics  . Smoking status: Current Everyday Smoker  . Smokeless tobacco: Not on file  . Alcohol Use: Yes      Review of Systems  Constitutional: Negative for fever and chills.  HENT: Negative for neck pain.   Eyes: Negative for pain.  Respiratory: Positive for cough. Negative for shortness of breath.   Cardiovascular: Negative for leg swelling.  Gastrointestinal: Negative for abdominal pain.  Genitourinary: Negative for flank pain.  Musculoskeletal: Negative for back pain.  Skin: Negative for rash.  Neurological: Negative for headaches.  Hematological: Does not bruise/bleed easily.  Psychiatric/Behavioral: Negative for confusion.    Allergies  Review of patient's  allergies indicates no known allergies.  Home Medications   Current Outpatient Rx  Name Route Sig Dispense Refill  . GABAPENTIN 300 MG PO CAPS Oral Take 300 mg by mouth 4 (four) times daily.    Marland Kitchen PAROXETINE HCL ER 25 MG PO TB24 Oral Take 25 mg by mouth every morning.    Marland Kitchen QUETIAPINE FUMARATE 100 MG PO TABS Oral Take 100 mg by mouth at bedtime.    Marland Kitchen QUETIAPINE FUMARATE 100 MG PO TABS Oral Take 1 tablet (100 mg total) by mouth at bedtime. 30 tablet 1    BP 141/105  Pulse 105  Temp 99.6 F (37.6 C) (Oral)  Resp 18  Ht 6\' 1"  (1.854 m)  Wt 240 lb (108.863 kg)  BMI 31.66 kg/m2  SpO2 100%  Physical Exam  Nursing note and vitals reviewed. Constitutional: He is oriented to person, place, and time. He appears well-developed and well-nourished. No distress.  HENT:  Head: Atraumatic.  Eyes: Conjunctivae are normal. Pupils are equal, round, and reactive to light. No scleral icterus.  Neck: Normal range of motion. Neck supple. No tracheal deviation present.  Cardiovascular: Normal rate, regular rhythm, normal heart sounds and intact distal pulses.   Pulmonary/Chest: Effort normal and breath sounds normal. No accessory muscle usage. No respiratory distress. He exhibits tenderness.       Pt with marked, focal, left lower rib pain reproducing/exacerbation his pain. No crepitus. No bruising over chest wall  Abdominal: Soft. Bowel sounds are normal. He exhibits no distension. There is no tenderness.  abd soft nt. No bruising or abd wall wall contusion.   Musculoskeletal: Normal range of motion. He exhibits no edema and no tenderness.  Neurological: He is alert and oriented to person, place, and time.       Motor intact bil. Steady gait.   Skin: Skin is warm and dry.  Psychiatric: He has a normal mood and affect.    ED Course  Procedures (including critical care time)   Labs Reviewed  CBC WITH DIFFERENTIAL  COMPREHENSIVE METABOLIC PANEL  PROTIME-INR  ETHANOL    Results for orders  placed during the hospital encounter of 05/30/12  CBC WITH DIFFERENTIAL      Component Value Range   WBC 7.1  4.0 - 10.5 K/uL   RBC 4.56  4.22 - 5.81 MIL/uL   Hemoglobin 16.0  13.0 - 17.0 g/dL   HCT 16.1  09.6 - 04.5 %   MCV 101.5 (*) 78.0 - 100.0 fL   MCH 35.1 (*) 26.0 - 34.0 pg   MCHC 34.6  30.0 - 36.0 g/dL   RDW 40.9  81.1 - 91.4 %   Platelets 154  150 - 400 K/uL   Neutrophils Relative 68  43 - 77 %   Neutro Abs 4.8  1.7 - 7.7 K/uL   Lymphocytes Relative 22  12 - 46 %   Lymphs Abs 1.6  0.7 - 4.0 K/uL   Monocytes Relative 8  3 - 12 %   Monocytes Absolute 0.6  0.1 - 1.0 K/uL   Eosinophils Relative 1  0 - 5 %   Eosinophils Absolute 0.1  0.0 - 0.7 K/uL   Basophils Relative 1  0 - 1 %   Basophils Absolute 0.0  0.0 - 0.1 K/uL  COMPREHENSIVE METABOLIC PANEL      Component Value Range   Sodium 134 (*) 135 - 145 mEq/L   Potassium 3.3 (*) 3.5 - 5.1 mEq/L   Chloride 95 (*) 96 - 112 mEq/L   CO2 27  19 - 32 mEq/L   Glucose, Bld 101 (*) 70 - 99 mg/dL   BUN 11  6 - 23 mg/dL   Creatinine, Ser 7.82  0.50 - 1.35 mg/dL   Calcium 9.6  8.4 - 95.6 mg/dL   Total Protein 7.9  6.0 - 8.3 g/dL   Albumin 4.0  3.5 - 5.2 g/dL   AST 51 (*) 0 - 37 U/L   ALT 33  0 - 53 U/L   Alkaline Phosphatase 79  39 - 117 U/L   Total Bilirubin 1.1  0.3 - 1.2 mg/dL   GFR calc non Af Amer >90  >90 mL/min   GFR calc Af Amer >90  >90 mL/min  PROTIME-INR      Component Value Range   Prothrombin Time 13.5  11.6 - 15.2 seconds   INR 1.01  0.00 - 1.49  ETHANOL      Component Value Range   Alcohol, Ethyl (B) <11  0 - 11 mg/dL  URINE RAPID DRUG SCREEN (HOSP PERFORMED)      Component Value Range   Opiates NONE DETECTED  NONE DETECTED   Cocaine NONE DETECTED  NONE DETECTED   Benzodiazepines NONE DETECTED  NONE DETECTED   Amphetamines NONE DETECTED  NONE DETECTED   Tetrahydrocannabinol POSITIVE (*) NONE DETECTED   Barbiturates NONE DETECTED  NONE DETECTED   Dg Ribs Unilateral W/chest Left  05/30/2012  *RADIOLOGY  REPORT*  Clinical Data: Left lower chest wall/rib tenderness.  No known injury.  LEFT  RIBS AND CHEST - 3+ VIEW  Comparison: Most recent prior chest x-ray 12/15/2010  Findings: No acute fracture, or aggressive appearing lytic or blastic osseous lesion identified.  Special attention was made to the region of tenderness marked by a metallic BB. Of note, the BB projects over the costochondral junction.  Healed rib fractures of the anterolateral aspects of the right ribs four, five, six and seven are again noted.  The lungs are clear.  The heart and mediastinal silhouettes are unremarkable.  IMPRESSION: 1.  No acute cardiopulmonary process. 2.   No acute fracture, or aggressive appearing bony lesion in the region of the patient's discomfort. Of note, the BB marking the site of the patient's discomfort projects over the costochondral junction.  Recommend clinical correlation for signs and symptoms of costochondritis.  Original Report Authenticated By: Vilma Prader      MDM  Pt says was dropped off, does not have to drive.  No meds pta. Confirmed nkda.  vicodin po.   Recheck hr 88 rr 16, pulse ox 100% ra.   Pt comfortable.  w consistent, reproducible, localized pain left lower rib/chest wall. abd soft nt.  Recheck hr 104. Pt states does not recall fall/injury. Pain worse w palp, but also worse w deep breathing/pleuritic quality to pain.  Pt denies needing help with etoh rehab. No tremor or shakes.   Will add ddimer. If elev, will get ct chest. If normal, feel stable for d/c home w pcp follow up. Sign out plan to Dr Oletta Lamas    Suzi Roots, MD 05/30/12 [id number]  Suzi Roots, MD 05/30/12 [phone]

## 2012-05-30 NOTE — ED Notes (Signed)
Pt c/o sudden onset left rib pain 2 days ago, worse with deep breathing and coughing.  Pt denies known injury.

## 2013-03-30 ENCOUNTER — Encounter (HOSPITAL_COMMUNITY): Payer: Self-pay

## 2013-03-30 ENCOUNTER — Emergency Department (HOSPITAL_COMMUNITY): Payer: Medicaid Other

## 2013-03-30 ENCOUNTER — Emergency Department (HOSPITAL_COMMUNITY)
Admission: EM | Admit: 2013-03-30 | Discharge: 2013-03-30 | Disposition: A | Discharge status: Home / Self Care | Payer: Medicaid Other | Attending: Emergency Medicine | Admitting: Emergency Medicine

## 2013-03-30 DIAGNOSIS — F17200 Nicotine dependence, unspecified, uncomplicated: Secondary | ICD-10-CM | POA: Insufficient documentation

## 2013-03-30 DIAGNOSIS — F319 Bipolar disorder, unspecified: Secondary | ICD-10-CM | POA: Insufficient documentation

## 2013-03-30 DIAGNOSIS — R6883 Chills (without fever): Secondary | ICD-10-CM | POA: Insufficient documentation

## 2013-03-30 DIAGNOSIS — Z87820 Personal history of traumatic brain injury: Secondary | ICD-10-CM | POA: Insufficient documentation

## 2013-03-30 DIAGNOSIS — R296 Repeated falls: Secondary | ICD-10-CM | POA: Insufficient documentation

## 2013-03-30 DIAGNOSIS — Y92410 Unspecified street and highway as the place of occurrence of the external cause: Secondary | ICD-10-CM | POA: Insufficient documentation

## 2013-03-30 DIAGNOSIS — M25511 Pain in right shoulder: Secondary | ICD-10-CM

## 2013-03-30 DIAGNOSIS — G8911 Acute pain due to trauma: Secondary | ICD-10-CM

## 2013-03-30 DIAGNOSIS — F602 Antisocial personality disorder: Secondary | ICD-10-CM | POA: Insufficient documentation

## 2013-03-30 DIAGNOSIS — Y9389 Activity, other specified: Secondary | ICD-10-CM | POA: Insufficient documentation

## 2013-03-30 DIAGNOSIS — S46909A Unspecified injury of unspecified muscle, fascia and tendon at shoulder and upper arm level, unspecified arm, initial encounter: Secondary | ICD-10-CM | POA: Insufficient documentation

## 2013-03-30 DIAGNOSIS — IMO0001 Reserved for inherently not codable concepts without codable children: Secondary | ICD-10-CM

## 2013-03-30 DIAGNOSIS — R000 Tachycardia, unspecified: Secondary | ICD-10-CM | POA: Insufficient documentation

## 2013-03-30 DIAGNOSIS — R61 Generalized hyperhidrosis: Secondary | ICD-10-CM | POA: Insufficient documentation

## 2013-03-30 DIAGNOSIS — I10 Essential (primary) hypertension: Secondary | ICD-10-CM | POA: Insufficient documentation

## 2013-03-30 DIAGNOSIS — S4980XA Other specified injuries of shoulder and upper arm, unspecified arm, initial encounter: Secondary | ICD-10-CM | POA: Insufficient documentation

## 2013-03-30 DIAGNOSIS — F411 Generalized anxiety disorder: Secondary | ICD-10-CM | POA: Insufficient documentation

## 2013-03-30 DIAGNOSIS — Z79899 Other long term (current) drug therapy: Secondary | ICD-10-CM | POA: Insufficient documentation

## 2013-03-30 MED ORDER — MORPHINE SULFATE 4 MG/ML IJ SOLN
4.0000 mg | Freq: Once | INTRAMUSCULAR | Status: AC
  Administered 2013-03-30: 4 mg via INTRAMUSCULAR
  Filled 2013-03-30: qty 1

## 2013-03-30 MED ORDER — HYDROCODONE-ACETAMINOPHEN 5-325 MG PO TABS: ORAL_TABLET | ORAL | Status: DC

## 2013-03-30 MED ORDER — LISINOPRIL 10 MG PO TABS: 10.0000 mg | ORAL_TABLET | Freq: Every day | ORAL | Status: DC

## 2013-03-30 NOTE — ED Notes (Signed)
Patient reports that he was pushing his friend across the street in their wheelchair and fell in the street. Patient does not recall how he fell. Patient c/o right shoulder pain and is worse with a deep breath.

## 2013-03-30 NOTE — ED Provider Notes (Signed)
History     CSN: [id number]  Arrival date & time 03/30/13  1325   First MD Initiated Contact with Patient 03/30/13 1404      Chief Complaint  Patient presents with  . Shoulder Pain  . Shoulder Injury    (Consider location/radiation/quality/duration/timing/severity/associated sxs/prior treatment) HPI Pt is a 52yo male c/o right shoulder pain x3-4 days after falling while trying to push a friend in a wheelchair.  States he landed directly on his shoulder.  Pain is 8/10, achy sore and sharp in nature with deep breathing.  Reports possible RCT in same shoulder last year.  Has not had anything for pain.  Also reports hx of manic depression, anxiety, and antisocial personality, stating he was evicted and has not taken any medication over past week.  Is seen at [facility] and recently established PCP care through [facility], appointment 5/30.     Past Medical History  Diagnosis Date  . Bipolar 1 disorder   . TBI (traumatic brain injury)   . Hypertension     Past Surgical History  Procedure Laterality Date  . Hip fracture surgery    . Appendectomy      Family History  Problem Relation Age of Onset  . Asthma Mother   . Heart failure Mother   . Hypertension Father   . Depression Sister   . Alcohol abuse Other     History  Substance Use Topics  . Smoking status: Current Every Day Smoker -- 2.00 packs/day    Types: Cigarettes  . Smokeless tobacco: Never Used  . Alcohol Use: Yes     Comment: drinks 3 40 ounce bottles beer      Review of Systems  Constitutional: Positive for chills and diaphoresis. Negative for fever.  Musculoskeletal: Positive for myalgias, joint swelling and arthralgias.  Skin: Positive for color change. Negative for wound.  All other systems reviewed and are negative.    Allergies  Review of patient's allergies indicates no known allergies.  Home Medications   Current Outpatient Rx  Name  Route  Sig  Dispense  Refill  . gabapentin (NEURONTIN)  300 MG capsule   Oral   Take 300 mg by mouth 4 (four) times daily.         Marland Kitchen ibuprofen (ADVIL,MOTRIN) 200 MG tablet   Oral   Take 200 mg by mouth every 6 (six) hours as needed for pain.         Marland Kitchen PARoxetine (PAXIL) 20 MG tablet   Oral   Take 20 mg by mouth every morning.         Marland Kitchen QUEtiapine (SEROQUEL) 50 MG tablet   Oral   Take 50 mg by mouth at bedtime.         Marland Kitchen HYDROcodone-acetaminophen (NORCO/VICODIN) 5-325 MG per tablet      Take 1-2 tablets every 4-6 hours as needed for pain.   6 tablet   0   . lisinopril (PRINIVIL,ZESTRIL) 10 MG tablet   Oral   Take 1 tablet (10 mg total) by mouth daily.   30 tablet   1     BP 166/109  Pulse 113  Temp(Src) 98.2 F (36.8 C) (Oral)  Resp 16  SpO2 100%  Physical Exam  Nursing note and vitals reviewed. Constitutional: He appears well-developed and well-nourished. No distress.  HENT:  Head: Normocephalic and atraumatic.  Eyes: Conjunctivae are normal. Right eye exhibits no discharge. Left eye exhibits no discharge. No scleral icterus.  Neck: Normal range of motion.  No midline cervical tenderness, crepitus or step-offs  Cardiovascular: Regular rhythm and normal heart sounds.  Tachycardia present.   Pulmonary/Chest: Effort normal and breath sounds normal. No respiratory distress. He has no wheezes. He has no rales. He exhibits no tenderness.  Abdominal: Soft. Bowel sounds are normal. He exhibits no distension and no mass. There is tenderness ( mild, diffuse). There is no rebound and no guarding.  Musculoskeletal: Normal range of motion.  Neurological: He is alert.  Skin: Skin is warm and dry. Ecchymosis ( right shoulder) noted. He is not diaphoretic.     Psychiatric: His speech is normal and behavior is normal. Judgment and thought content normal. His mood appears anxious. Cognition and memory are normal.    ED Course  Procedures (including critical care time)  Labs Reviewed - No data to display Dg Shoulder  Right  03/30/2013   *RADIOLOGY REPORT*  Clinical Data: Larey Seat on right side 4-5 days ago, axillary pain, prior shoulder injuries bilaterally  RIGHT SHOULDER - 2+ VIEW  Comparison: 05/10/2010  Findings: Osseous demineralization. AC joint alignment normal. No acute fracture, dislocation or bone destruction. Old fractures of the lateral right fourth and fifth ribs.  IMPRESSION: No acute right shoulder abnormalities.   Original Report Authenticated By: Ulyses Southward, M.D.     1. Acute shoulder pain due to trauma, right   2. Elevated blood pressure       MDM  Pt c/o right shoulder pain after falling 3-4 days ago.  Denies hitting head or LOC.  No head or neck pain.  Pt is aware of HTN in the past but discontinued medicine due to weight loss and drop in BP but today BP is 143/111.  PE: right shoulder ecchymosis and tenderness. FROM, nl sensation and 5/5 grip strength,  Plain films: no acute right shoulder abdnormalities.  Will discharge home on norco and lisinopril 10mg  for HTN.  F/u with PCP on 5/30, advise PCP started on lisinopril.    Vitals: unremarkable. Discharged in stable condition.    Discussed pt with attending during ED encounter.         Junius Finner, PA-C 03/31/13 1151

## 2013-04-01 ENCOUNTER — Encounter (HOSPITAL_COMMUNITY): Payer: Self-pay | Admitting: Emergency Medicine

## 2013-04-01 ENCOUNTER — Emergency Department (HOSPITAL_COMMUNITY): Payer: Medicaid Other

## 2013-04-01 ENCOUNTER — Inpatient Hospital Stay (HOSPITAL_COMMUNITY)
Admission: EM | Admit: 2013-04-01 | Discharge: 2013-04-04 | DRG: 312 | Disposition: A | Discharge status: Home / Self Care | Payer: Medicaid Other | Attending: Internal Medicine | Admitting: Internal Medicine

## 2013-04-01 DIAGNOSIS — F319 Bipolar disorder, unspecified: Secondary | ICD-10-CM | POA: Diagnosis present

## 2013-04-01 DIAGNOSIS — R071 Chest pain on breathing: Secondary | ICD-10-CM | POA: Diagnosis present

## 2013-04-01 DIAGNOSIS — A419 Sepsis, unspecified organism: Secondary | ICD-10-CM | POA: Diagnosis present

## 2013-04-01 DIAGNOSIS — I951 Orthostatic hypotension: Principal | ICD-10-CM | POA: Diagnosis present

## 2013-04-01 DIAGNOSIS — F17200 Nicotine dependence, unspecified, uncomplicated: Secondary | ICD-10-CM | POA: Diagnosis present

## 2013-04-01 DIAGNOSIS — D72829 Elevated white blood cell count, unspecified: Secondary | ICD-10-CM | POA: Diagnosis present

## 2013-04-01 DIAGNOSIS — I10 Essential (primary) hypertension: Secondary | ICD-10-CM | POA: Diagnosis present

## 2013-04-01 DIAGNOSIS — E876 Hypokalemia: Secondary | ICD-10-CM | POA: Diagnosis present

## 2013-04-01 DIAGNOSIS — Y929 Unspecified place or not applicable: Secondary | ICD-10-CM

## 2013-04-01 DIAGNOSIS — E869 Volume depletion, unspecified: Secondary | ICD-10-CM | POA: Diagnosis present

## 2013-04-01 DIAGNOSIS — I959 Hypotension, unspecified: Secondary | ICD-10-CM | POA: Diagnosis present

## 2013-04-01 DIAGNOSIS — F1910 Other psychoactive substance abuse, uncomplicated: Secondary | ICD-10-CM | POA: Diagnosis present

## 2013-04-01 DIAGNOSIS — IMO0001 Reserved for inherently not codable concepts without codable children: Secondary | ICD-10-CM | POA: Diagnosis present

## 2013-04-01 DIAGNOSIS — W010XXA Fall on same level from slipping, tripping and stumbling without subsequent striking against object, initial encounter: Secondary | ICD-10-CM | POA: Diagnosis present

## 2013-04-01 DIAGNOSIS — F1010 Alcohol abuse, uncomplicated: Secondary | ICD-10-CM | POA: Diagnosis present

## 2013-04-01 DIAGNOSIS — R1011 Right upper quadrant pain: Secondary | ICD-10-CM | POA: Diagnosis present

## 2013-04-01 DIAGNOSIS — Z87820 Personal history of traumatic brain injury: Secondary | ICD-10-CM

## 2013-04-01 DIAGNOSIS — IMO0002 Reserved for concepts with insufficient information to code with codable children: Secondary | ICD-10-CM | POA: Diagnosis present

## 2013-04-01 DIAGNOSIS — N179 Acute kidney failure, unspecified: Secondary | ICD-10-CM | POA: Diagnosis present

## 2013-04-01 DIAGNOSIS — T50995A Adverse effect of other drugs, medicaments and biological substances, initial encounter: Secondary | ICD-10-CM | POA: Diagnosis present

## 2013-04-01 DIAGNOSIS — I498 Other specified cardiac arrhythmias: Secondary | ICD-10-CM | POA: Diagnosis present

## 2013-04-01 DIAGNOSIS — R55 Syncope and collapse: Secondary | ICD-10-CM | POA: Diagnosis present

## 2013-04-01 LAB — CBC WITH DIFFERENTIAL/PLATELET
Eosinophils Relative: 1 % (ref 0–5)
HCT: 41.8 % (ref 39.0–52.0)
Hemoglobin: 14.1 g/dL (ref 13.0–17.0)
Lymphocytes Relative: 12 % (ref 12–46)
Lymphs Abs: 1.5 10*3/uL (ref 0.7–4.0)
MCV: 100 fL (ref 78.0–100.0)
Monocytes Absolute: 1.6 10*3/uL — ABNORMAL HIGH (ref 0.1–1.0)
RBC: 4.18 MIL/uL — ABNORMAL LOW (ref 4.22–5.81)
WBC: 12.7 10*3/uL — ABNORMAL HIGH (ref 4.0–10.5)

## 2013-04-01 LAB — COMPREHENSIVE METABOLIC PANEL
ALT: 14 U/L (ref 0–53)
CO2: 23 mEq/L (ref 19–32)
Calcium: 8.8 mg/dL (ref 8.4–10.5)
Chloride: 102 mEq/L (ref 96–112)
Creatinine, Ser: 2.29 mg/dL — ABNORMAL HIGH (ref 0.50–1.35)
GFR calc Af Amer: 36 mL/min — ABNORMAL LOW (ref >90)
GFR calc non Af Amer: 31 mL/min — ABNORMAL LOW (ref >90)
Glucose, Bld: 107 mg/dL — ABNORMAL HIGH (ref 70–99)
Sodium: 136 mEq/L (ref 135–145)
Total Bilirubin: 1.5 mg/dL — ABNORMAL HIGH (ref 0.3–1.2)

## 2013-04-01 LAB — OCCULT BLOOD, POC DEVICE: Fecal Occult Bld: NEGATIVE

## 2013-04-01 MED ORDER — VANCOMYCIN HCL IN DEXTROSE 1-5 GM/200ML-% IV SOLN
1000.0000 mg | Freq: Once | INTRAVENOUS | Status: AC
  Administered 2013-04-02: 1000 mg via INTRAVENOUS
  Filled 2013-04-01: qty 200

## 2013-04-01 MED ORDER — SODIUM CHLORIDE 0.9 % IV BOLUS (SEPSIS)
1000.0000 mL | Freq: Once | INTRAVENOUS | Status: AC
  Administered 2013-04-01: 1000 mL via INTRAVENOUS

## 2013-04-01 MED ORDER — PIPERACILLIN-TAZOBACTAM 3.375 G IVPB
3.3750 g | Freq: Once | INTRAVENOUS | Status: AC
  Administered 2013-04-01: 3.375 g via INTRAVENOUS
  Filled 2013-04-01: qty 50

## 2013-04-01 MED ORDER — SODIUM CHLORIDE 0.9 % IV SOLN
INTRAVENOUS | Status: AC
  Administered 2013-04-02: 01:00:00 via INTRAVENOUS

## 2013-04-01 NOTE — ED Provider Notes (Signed)
85/49  Lyanne Co, MD 04/01/13 [phone]

## 2013-04-01 NOTE — ED Provider Notes (Signed)
History     CSN: [id number]  Arrival date & time 04/01/13  1944   First MD Initiated Contact with Patient 04/01/13 2009      Chief Complaint  Patient presents with  . Hypotension   HPI  History provided by the patient. Patient is a 52 year old male with past history is of traumatic brain injury, bipolar disorder and hypertension as well as alcohol abuse who presents with syncopal episode and hypotension. Patient was on his way to the grocery store and was feeling very lightheaded and having some tingling throughout his body. He then reports having a syncopal episode . He denies any significant injury or pain from a fall. After being helped up by a nearby please officer he had a second syncopal episode upon standing. Again patient denied any significant injury from fall aside a small abrasion to his elbow. She was brought to emergency room by EMS and found to have blood pressures as low as 57/34 by EMS. An IV was started and IV fluids were given. Patient was seen 2 days ago for complaints of right shoulder pain and injuries. He states he generally has chronic pains that are recent injury where he fell on and was seen for this. After being evaluated he was noted to have elevated blood pressures and was given a prescription for 10 mg lisinopril as well as prescription for Vicodin. Patient began taking 10 mg lisinopril once a day for the past 2 days, including today. He has also been using the Vicodin but has not taken any Vicodin today. Patient also reports that he was a daily alcohol user drinking between 2-3, 40 ounce beers a day. Patient decided to stop after his visit to the emergency room on the 19th has not had any alcohol since that time. He denies any past history of severe withdrawals but does report having some tremors yesterday. Patient denies any other drug use. He is a current everyday smoker. Denies any other changes in his medications. He denies any pain at this time. Denies any episodes of  chest pain or shortness of breath. Denies any confusion. Denies any weakness or numbness currently in extremities.    Past Medical History  Diagnosis Date  . Bipolar 1 disorder   . TBI (traumatic brain injury)   . Hypertension     Past Surgical History  Procedure Laterality Date  . Hip fracture surgery    . Appendectomy      Family History  Problem Relation Age of Onset  . Asthma Mother   . Heart failure Mother   . Hypertension Father   . Depression Sister   . Alcohol abuse Other     History  Substance Use Topics  . Smoking status: Current Every Day Smoker -- 2.00 packs/day    Types: Cigarettes  . Smokeless tobacco: Never Used  . Alcohol Use: Yes     Comment: drinks 3 40 ounce bottles beer      Review of Systems  Respiratory: Negative for shortness of breath.   Cardiovascular: Negative for chest pain.  Neurological: Positive for syncope and light-headedness. Negative for weakness and headaches.  Psychiatric/Behavioral: Negative for confusion.  All other systems reviewed and are negative.    Allergies  Review of patient's allergies indicates no known allergies.  Home Medications   Current Outpatient Rx  Name  Route  Sig  Dispense  Refill  . gabapentin (NEURONTIN) 300 MG capsule   Oral   Take 300 mg by mouth 4 (four) times  daily.         Marland Kitchen HYDROcodone-acetaminophen (NORCO/VICODIN) 5-325 MG per tablet      Take 1-2 tablets every 4-6 hours as needed for pain.   6 tablet   0   . ibuprofen (ADVIL,MOTRIN) 200 MG tablet   Oral   Take 200 mg by mouth every 6 (six) hours as needed for pain.         Marland Kitchen lisinopril (PRINIVIL,ZESTRIL) 10 MG tablet   Oral   Take 1 tablet (10 mg total) by mouth daily.   30 tablet   1   . PARoxetine (PAXIL) 20 MG tablet   Oral   Take 20 mg by mouth daily.          . QUEtiapine (SEROQUEL) 50 MG tablet   Oral   Take 50 mg by mouth at bedtime.           BP 106/66  Pulse 104  Temp(Src) 97.7 F (36.5 C) (Oral)   Resp 19  SpO2 100%  Physical Exam  Nursing note and vitals reviewed. Constitutional: He is oriented to person, place, and time. He appears well-developed and well-nourished. No distress.  HENT:  Head: Normocephalic and atraumatic.  Eyes: Conjunctivae and EOM are normal. Pupils are equal, round, and reactive to light.  Neck: Normal range of motion. Neck supple.  No cervical midline tenderness  Cardiovascular: Regular rhythm.  Tachycardia present.   No murmur heard. Pulmonary/Chest: Effort normal and breath sounds normal. No respiratory distress. He has no wheezes. He has no rales.  Abdominal: Soft. There is no tenderness.  Musculoskeletal: Normal range of motion. He exhibits no edema.  Neurological: He is alert and oriented to person, place, and time. He has normal strength. No sensory deficit.  Skin: Skin is warm.  Psychiatric: He has a normal mood and affect. His behavior is normal.    ED Course  CRITICAL CARE Performed by: Angus Seller Authorized by: Angus Seller Total critical care time: 30 minutes Critical care was necessary to treat or prevent imminent or life-threatening deterioration of the following conditions: sepsis. Critical care was time spent personally by me on the following activities: discussions with consultants, examination of patient, evaluation of patient's response to treatment, re-evaluation of patient's condition and ordering and performing treatments and interventions.     Results for orders placed during the hospital encounter of 04/01/13  CBC WITH DIFFERENTIAL      Result Value Range   WBC 12.7 (*) 4.0 - 10.5 K/uL   RBC 4.18 (*) 4.22 - 5.81 MIL/uL   Hemoglobin 14.1  13.0 - 17.0 g/dL   HCT 16.1  09.6 - 04.5 %   MCV 100.0  78.0 - 100.0 fL   MCH 33.7  26.0 - 34.0 pg   MCHC 33.7  30.0 - 36.0 g/dL   RDW 40.9  81.1 - 91.4 %   Platelets 162  150 - 400 K/uL   Neutrophils Relative % 75  43 - 77 %   Neutro Abs 9.5 (*) 1.7 - 7.7 K/uL   Lymphocytes  Relative 12  12 - 46 %   Lymphs Abs 1.5  0.7 - 4.0 K/uL   Monocytes Relative 12  3 - 12 %   Monocytes Absolute 1.6 (*) 0.1 - 1.0 K/uL   Eosinophils Relative 1  0 - 5 %   Eosinophils Absolute 0.1  0.0 - 0.7 K/uL   Basophils Relative 0  0 - 1 %   Basophils Absolute 0.0  0.0 -  0.1 K/uL  COMPREHENSIVE METABOLIC PANEL      Result Value Range   Sodium 136  135 - 145 mEq/L   Potassium 3.8  3.5 - 5.1 mEq/L   Chloride 102  96 - 112 mEq/L   CO2 23  19 - 32 mEq/L   Glucose, Bld 107 (*) 70 - 99 mg/dL   BUN 19  6 - 23 mg/dL   Creatinine, Ser 1.61 (*) 0.50 - 1.35 mg/dL   Calcium 8.8  8.4 - 09.6 mg/dL   Total Protein 6.9  6.0 - 8.3 g/dL   Albumin 3.2 (*) 3.5 - 5.2 g/dL   AST 20  0 - 37 U/L   ALT 14  0 - 53 U/L   Alkaline Phosphatase 56  39 - 117 U/L   Total Bilirubin 1.5 (*) 0.3 - 1.2 mg/dL   GFR calc non Af Amer 31 (*) >90 mL/min   GFR calc Af Amer 36 (*) >90 mL/min  LACTIC ACID, PLASMA      Result Value Range   Lactic Acid, Venous 2.5 (*) 0.5 - 2.2 mmol/L  POCT I-STAT TROPONIN I      Result Value Range   Troponin i, poc 0.02  0.00 - 0.08 ng/mL   Comment 3           OCCULT BLOOD, POC DEVICE      Result Value Range   Fecal Occult Bld NEGATIVE  NEGATIVE      Dg Chest Port 1 View  04/01/2013   *RADIOLOGY REPORT*  Clinical Data: Hypotension and weakness.  PORTABLE CHEST - 1 VIEW  Comparison: 05/30/2012.  Findings: The cardiac silhouette, mediastinal and hilar contours are normal and stable.  The lungs are clear.  No pleural effusion. The bony thorax is intact.  Remote healed rib fractures are noted.  IMPRESSION: No acute cardiopulmonary findings.   Original Report Authenticated By: Rudie Meyer, M.D.     1. Sepsis associated hypotension       MDM  8:10 PM patient seen and evaluated. IV fluids going. Blood pressure currently improving. Patient does not appear in acute distress.  Patient also seen and evaluated with attending physician. Blood pressures initially improving patient in  Trendelenburg position with 2 L bolus. Previous medical records reviewed.  Patient sitting upright and eating a meal blood pressures are slowly beginning to drop again. Additional fluids ordered. Patient does have slight elevated rectal temperature 100.7. Will add Lactaid, blood cultures and begin antibiotics for concern for possible sepsis of unknown source.  Patient requiring multiple re\re evaluations after responses to fluids for hypotension. Previous chart reviewed. Lab results and x-rays reviewed.  Spoke with Dr. Eben Burow with [facility]. He will see patient and admit. Would like holding orders for step down bed under team 8    Date: 04/01/2013  Rate: 127  Rhythm: sinus tachycardia  QRS Axis: normal  Intervals: normal  ST/T Wave abnormalities: nonspecific ST/T changes  Conduction Disutrbances:none  Narrative Interpretation:   Old EKG Reviewed: No significant change from 03/30/2013      Angus Seller, PA-C 04/01/13 2358

## 2013-04-01 NOTE — ED Notes (Signed)
Blood pressure now stable. Pt returned to semi-fowlers position.

## 2013-04-01 NOTE — ED Notes (Signed)
Pt c/o hypotension. EMS obtained a low BP of 57/34. EMS started an IV and gave 400 ml of NS. Pt blood pressure prior to arrival was 146/115. Pt was recently started on lisinopril on 03/30/13. Pt was seen on 5/19 for a rib/shoulder injury and was given Vicodin prescription for home. Pt also c/o constipation and has tried to use prune juice and other over-the-counter interventions, without relief.

## 2013-04-01 NOTE — ED Provider Notes (Signed)
  Medical screening examination/treatment/procedure(s) were performed by non-physician practitioner and as supervising physician I was immediately available for consultation/collaboration.    Gerhard Munch, MD 04/01/13 [phone]

## 2013-04-01 NOTE — ED Notes (Addendum)
Blood pressure is 65/38. Patient placed in trendelenburg position.

## 2013-04-02 ENCOUNTER — Inpatient Hospital Stay (HOSPITAL_COMMUNITY): Payer: Medicaid Other

## 2013-04-02 ENCOUNTER — Observation Stay (HOSPITAL_COMMUNITY): Payer: Medicaid Other

## 2013-04-02 DIAGNOSIS — A419 Sepsis, unspecified organism: Secondary | ICD-10-CM

## 2013-04-02 DIAGNOSIS — R55 Syncope and collapse: Secondary | ICD-10-CM | POA: Diagnosis present

## 2013-04-02 DIAGNOSIS — I10 Essential (primary) hypertension: Secondary | ICD-10-CM | POA: Diagnosis present

## 2013-04-02 DIAGNOSIS — R6520 Severe sepsis without septic shock: Secondary | ICD-10-CM

## 2013-04-02 DIAGNOSIS — F1910 Other psychoactive substance abuse, uncomplicated: Secondary | ICD-10-CM | POA: Diagnosis present

## 2013-04-02 DIAGNOSIS — R6521 Severe sepsis with septic shock: Secondary | ICD-10-CM

## 2013-04-02 DIAGNOSIS — N179 Acute kidney failure, unspecified: Secondary | ICD-10-CM | POA: Diagnosis present

## 2013-04-02 DIAGNOSIS — F319 Bipolar disorder, unspecified: Secondary | ICD-10-CM | POA: Diagnosis present

## 2013-04-02 LAB — URINALYSIS, ROUTINE W REFLEX MICROSCOPIC
Bilirubin Urine: NEGATIVE
Hgb urine dipstick: NEGATIVE
Protein, ur: NEGATIVE mg/dL
Urobilinogen, UA: 0.2 mg/dL (ref 0.0–1.0)

## 2013-04-02 LAB — CBC WITH DIFFERENTIAL/PLATELET
Basophils Relative: 0 % (ref 0–1)
Eosinophils Absolute: 0.2 10*3/uL (ref 0.0–0.7)
Eosinophils Relative: 2 % (ref 0–5)
Hemoglobin: 13 g/dL (ref 13.0–17.0)
Lymphs Abs: 2.5 10*3/uL (ref 0.7–4.0)
MCH: 34.5 pg — ABNORMAL HIGH (ref 26.0–34.0)
MCHC: 34.1 g/dL (ref 30.0–36.0)
MCV: 101.1 fL — ABNORMAL HIGH (ref 78.0–100.0)
Monocytes Relative: 10 % (ref 3–12)
Neutrophils Relative %: 64 % (ref 43–77)
Platelets: 162 10*3/uL (ref 150–400)
RBC: 3.77 MIL/uL — ABNORMAL LOW (ref 4.22–5.81)

## 2013-04-02 LAB — MRSA PCR SCREENING: MRSA by PCR: NEGATIVE

## 2013-04-02 LAB — COMPREHENSIVE METABOLIC PANEL
ALT: 13 U/L (ref 0–53)
AST: 18 U/L (ref 0–37)
Albumin: 2.8 g/dL — ABNORMAL LOW (ref 3.5–5.2)
CO2: 21 mEq/L (ref 19–32)
Chloride: 102 mEq/L (ref 96–112)
Creatinine, Ser: 1.67 mg/dL — ABNORMAL HIGH (ref 0.50–1.35)
GFR calc non Af Amer: 45 mL/min — ABNORMAL LOW (ref >90)
Sodium: 136 mEq/L (ref 135–145)
Total Bilirubin: 1.3 mg/dL — ABNORMAL HIGH (ref 0.3–1.2)

## 2013-04-02 LAB — PROCALCITONIN: Procalcitonin: 0.15 ng/mL

## 2013-04-02 MED ORDER — QUETIAPINE FUMARATE 50 MG PO TABS
50.0000 mg | ORAL_TABLET | Freq: Every day | ORAL | Status: DC
  Administered 2013-04-02 – 2013-04-03 (×2): 50 mg via ORAL
  Filled 2013-04-02 (×3): qty 1

## 2013-04-02 MED ORDER — ONDANSETRON HCL 4 MG/2ML IJ SOLN: 4.0000 mg | Freq: Four times a day (QID) | INTRAMUSCULAR | Status: DC | PRN

## 2013-04-02 MED ORDER — PIPERACILLIN-TAZOBACTAM 3.375 G IVPB
3.3750 g | Freq: Three times a day (TID) | INTRAVENOUS | Status: AC
  Administered 2013-04-02 – 2013-04-03 (×5): 3.375 g via INTRAVENOUS
  Filled 2013-04-02 (×5): qty 50

## 2013-04-02 MED ORDER — SODIUM CHLORIDE 0.9 % IV BOLUS (SEPSIS)
500.0000 mL | Freq: Once | INTRAVENOUS | Status: AC
  Administered 2013-04-02: 12:00:00 via INTRAVENOUS

## 2013-04-02 MED ORDER — ONDANSETRON HCL 4 MG PO TABS: 4.0000 mg | ORAL_TABLET | Freq: Four times a day (QID) | ORAL | Status: DC | PRN

## 2013-04-02 MED ORDER — VANCOMYCIN HCL IN DEXTROSE 1-5 GM/200ML-% IV SOLN
1000.0000 mg | Freq: Once | INTRAVENOUS | Status: AC
  Administered 2013-04-02: 1000 mg via INTRAVENOUS
  Filled 2013-04-02: qty 200

## 2013-04-02 MED ORDER — PAROXETINE HCL 20 MG PO TABS
20.0000 mg | ORAL_TABLET | Freq: Every day | ORAL | Status: DC
  Administered 2013-04-02 – 2013-04-04 (×3): 20 mg via ORAL
  Filled 2013-04-02 (×3): qty 1

## 2013-04-02 MED ORDER — ENOXAPARIN SODIUM 40 MG/0.4ML ~~LOC~~ SOLN
40.0000 mg | SUBCUTANEOUS | Status: DC
  Administered 2013-04-02 – 2013-04-04 (×3): 40 mg via SUBCUTANEOUS
  Filled 2013-04-02 (×3): qty 0.4

## 2013-04-02 MED ORDER — VANCOMYCIN HCL 10 G IV SOLR
1500.0000 mg | INTRAVENOUS | Status: AC
  Administered 2013-04-02: 1500 mg via INTRAVENOUS
  Filled 2013-04-02: qty 1500

## 2013-04-02 MED ORDER — VANCOMYCIN HCL IN DEXTROSE 1-5 GM/200ML-% IV SOLN
1000.0000 mg | Freq: Two times a day (BID) | INTRAVENOUS | Status: DC
  Filled 2013-04-02: qty 200

## 2013-04-02 MED ORDER — SODIUM CHLORIDE 0.9 % IV SOLN
INTRAVENOUS | Status: DC
  Administered 2013-04-02 – 2013-04-03 (×4): via INTRAVENOUS

## 2013-04-02 MED ORDER — SODIUM CHLORIDE 0.9 % IV BOLUS (SEPSIS)
1000.0000 mL | Freq: Once | INTRAVENOUS | Status: AC
  Administered 2013-04-02: 1000 mL via INTRAVENOUS

## 2013-04-02 MED ORDER — POTASSIUM CHLORIDE CRYS ER 20 MEQ PO TBCR
40.0000 meq | EXTENDED_RELEASE_TABLET | Freq: Once | ORAL | Status: AC
  Administered 2013-04-02: 40 meq via ORAL
  Filled 2013-04-02: qty 2

## 2013-04-02 MED ORDER — PNEUMOCOCCAL VAC POLYVALENT 25 MCG/0.5ML IJ INJ
0.5000 mL | INJECTION | INTRAMUSCULAR | Status: AC
  Administered 2013-04-03: 0.5 mL via INTRAMUSCULAR
  Filled 2013-04-02 (×2): qty 0.5

## 2013-04-02 MED ORDER — HYDROCODONE-ACETAMINOPHEN 5-325 MG PO TABS
1.0000 | ORAL_TABLET | ORAL | Status: DC | PRN
  Administered 2013-04-02 – 2013-04-04 (×6): 2 via ORAL
  Filled 2013-04-02 (×6): qty 2

## 2013-04-02 MED ORDER — TECHNETIUM TO 99M ALBUMIN AGGREGATED
4.5000 | Freq: Once | INTRAVENOUS | Status: AC | PRN
  Administered 2013-04-02: 5 via INTRAVENOUS

## 2013-04-02 MED ORDER — TECHNETIUM TC 99M DIETHYLENETRIAME-PENTAACETIC ACID
36.9000 | Freq: Once | INTRAVENOUS | Status: AC | PRN
  Administered 2013-04-02: 36.9 via INTRAVENOUS

## 2013-04-02 MED ORDER — NICOTINE 21 MG/24HR TD PT24
21.0000 mg | MEDICATED_PATCH | Freq: Every day | TRANSDERMAL | Status: DC
  Administered 2013-04-02 – 2013-04-04 (×3): 21 mg via TRANSDERMAL
  Filled 2013-04-02 (×3): qty 1

## 2013-04-02 MED ORDER — GABAPENTIN 300 MG PO CAPS
300.0000 mg | ORAL_CAPSULE | Freq: Four times a day (QID) | ORAL | Status: DC
  Administered 2013-04-02 – 2013-04-04 (×8): 300 mg via ORAL
  Filled 2013-04-02 (×12): qty 1

## 2013-04-02 MED ORDER — KETOROLAC TROMETHAMINE 30 MG/ML IJ SOLN
30.0000 mg | Freq: Once | INTRAMUSCULAR | Status: AC
  Administered 2013-04-02: 30 mg via INTRAVENOUS
  Filled 2013-04-02: qty 1

## 2013-04-02 MED ORDER — VANCOMYCIN HCL 10 G IV SOLR: 1500.0000 mg | INTRAVENOUS | Status: DC

## 2013-04-02 NOTE — ED Notes (Signed)
Admitting MD at bedside.

## 2013-04-02 NOTE — Progress Notes (Signed)
Pt with b/p of 75/47 midlevel called awaiting call back. Pt place in supine position. Pt w/o any s/s of distress at this time will continue to monitor.

## 2013-04-02 NOTE — ED Notes (Signed)
Called ICU to notify that patient is currently having ultrasound done.

## 2013-04-02 NOTE — ED Notes (Signed)
Bedside ultrasound in progress.

## 2013-04-02 NOTE — ED Provider Notes (Signed)
Medical screening examination/treatment/procedure(s) were conducted as a shared visit with non-physician practitioner(s) and myself.  I personally evaluated the patient during the encounter.   Severe hypertension on arrival emergency department.  Patient initially responded IV fluids.  His blood pressures falling back in the 80s again.  Repeat fluid boluses now.  Patient be admitted the hospital.  His lactate is 2.5.  Vancomycin and Zosyn given at this time.  Rectal temp 100.7.  Patient does have some right upper quadrant abdominal tenderness.  Ultrasound pending at this time.  The patient will continue to monitor closely in the emergency department.  He's receiving meters 3 and 4 at this time.  Some of this may be related to his new antihypertensive started 40 hours ago.    CRITICAL CARE Performed by: Lyanne Co Total critical care time: 30 Critical care time was exclusive of separately billable procedures and treating other patients. Critical care was necessary to treat or prevent imminent or life-threatening deterioration. Critical care was time spent personally by me on the following activities: development of treatment plan with patient and/or surrogate as well as nursing, discussions with consultants, evaluation of patient's response to treatment, examination of patient, obtaining history from patient or surrogate, ordering and performing treatments and interventions, ordering and review of laboratory studies, ordering and review of radiographic studies, pulse oximetry and re-evaluation of patient's condition.    Lyanne Co, MD 04/02/13 [phone]

## 2013-04-02 NOTE — H&P (Signed)
History and Physical  JOQUAN LOTZ MWU:[id number] DOB: 07-05-1961 DOA: 04/01/2013  Referring physician: Dr Patria Mane (ER) PCP: Dorrene German, MD   Chief Complaint: Syncope  HPI: Patient is a 52 year old male with past history is of traumatic brain injury, bipolar disorder and hypertension as well as ongoing alcohol abuse who presents with syncopal episode and found to be hypotensive.   Patient was on his way to the grocery store and was feeling very lightheaded. He then reports falling down with LOC for few seconds. He denies any significant injury or pain from a fall. After being helped up by a nearby people he had a second syncopal episode upon standing. Again patient denied any significant injury from fall aside a small abrasion to his elbow. EMS was called and he was then brought to emergency room by EMS and found to have blood pressures as low as 57/34. An IV was started and IV fluids were given.   Patient was seen 2 days ago for complaints of right shoulder pain and injuries. He states he generally has chronic pains that are recent injury where he fell on and was seen for this. After being evaluated he was noted to have elevated blood pressures and was given a prescription for 10 mg lisinopril as well as prescription for Vicodin. Patient began taking 10 mg lisinopril once a day for the past 2 days, including today. He has also been using the Vicodin but has not taken any Vicodin today. Patient also reports that he was a daily alcohol user drinking between 2-3, 40 ounce beers a day. Patient decided to stop after his visit to the emergency room on the 19th has not had any alcohol since that time. Patient denies any other drug use. He is a current everyday smoker. Denies any other changes in his medications. Denies any confusion. Denies any weakness or numbness currently in extremities.  Patient has pain in his right side of chest and shoulder along with right upper quadrant pain since 7 days after a  fall.   Review of Systems:  15 point review of system is negative excpet what is noted above.   Past Medical History  Diagnosis Date  . Bipolar 1 disorder   . TBI (traumatic brain injury)   . Hypertension     Past Surgical History  Procedure Laterality Date  . Hip fracture surgery    . Appendectomy      Social History:  reports that he has been smoking Cigarettes.  He has been smoking about 2.00 packs per day. He has never used smokeless tobacco. He reports that  drinks alcohol. He reports that he uses illicit drugs (Marijuana).  No Known Allergies  Family History  Problem Relation Age of Onset  . Asthma Mother   . Heart failure Mother   . Hypertension Father   . Depression Sister   . Alcohol abuse Other      Prior to Admission medications   Medication Sig Start Date End Date Taking? Authorizing Provider  gabapentin (NEURONTIN) 300 MG capsule Take 300 mg by mouth 4 (four) times daily.   Yes Historical Provider, MD  HYDROcodone-acetaminophen (NORCO/VICODIN) 5-325 MG per tablet Take 1-2 tablets every 4-6 hours as needed for pain. 03/30/13  Yes Junius Finner, PA-C  ibuprofen (ADVIL,MOTRIN) 200 MG tablet Take 200 mg by mouth every 6 (six) hours as needed for pain.   Yes Historical Provider, MD  lisinopril (PRINIVIL,ZESTRIL) 10 MG tablet Take 1 tablet (10 mg total) by mouth daily. 03/30/13  Yes Junius Finner, PA-C  PARoxetine (PAXIL) 20 MG tablet Take 20 mg by mouth daily.    Yes Historical Provider, MD  QUEtiapine (SEROQUEL) 50 MG tablet Take 50 mg by mouth at bedtime.   Yes Historical Provider, MD   Physical Exam: Filed Vitals:   04/01/13 2315 04/01/13 2330 04/02/13 0000 04/02/13 0030  BP: 84/49 89/56 103/64 104/69  Pulse: 108 106 100 105  Temp:      TempSrc:      Resp: 18 16 19 17   SpO2: 98% 98% 97% 97%  Physical Exam: General: Vital signs reviewed and noted. Well-developed, well-nourished, in no acute distress; alert, appropriate and cooperative throughout examination.   Head: Normocephalic, atraumatic.  Eyes: PERRL, EOMI, No signs of anemia or jaundince.  Nose: Mucous membranes moist, not inflammed, nonerythematous.  Throat: Oropharynx nonerythematous, no exudate appreciated.   Neck: No deformities, masses, or tenderness noted.Supple, No carotid Bruits, no JVD.  Lungs:  Normal respiratory effort. Clear to auscultation BL without crackles or wheezes.  Heart: RRR. S1 and S2 normal without gallop, murmur, or rubs.  Abdomen:  BS normoactive. Soft, Nondistended, right upper quadrant tenderness noted.  No masses or organomegaly.  Extremities: No pretibial edema.  Neurologic: A&O X3, CN II - XII are grossly intact. Motor strength is 5/5 in the all 4 extremities, Sensations intact to light touch, Cerebellar signs negative.  Skin: No visible rashes, scars.     Wt Readings from Last 3 Encounters:  05/30/12 240 lb (108.863 kg)    Labs on Admission:  Basic Metabolic Panel:  Recent Labs Lab 04/01/13 2040  NA 136  K 3.8  CL 102  CO2 23  GLUCOSE 107*  BUN 19  CREATININE 2.29*  CALCIUM 8.8    Liver Function Tests:  Recent Labs Lab 04/01/13 2040  AST 20  ALT 14  ALKPHOS 56  BILITOT 1.5*  PROT 6.9  ALBUMIN 3.2*   No results found for this basename: LIPASE, AMYLASE,  in the last 168 hours No results found for this basename: AMMONIA,  in the last 168 hours  CBC:  Recent Labs Lab 04/01/13 2040  WBC 12.7*  NEUTROABS 9.5*  HGB 14.1  HCT 41.8  MCV 100.0  PLT 162    Cardiac Enzymes: No results found for this basename: CKTOTAL, CKMB, CKMBINDEX, TROPONINI,  in the last 168 hours  Troponin (Point of Care Test)  Recent Labs  04/01/13 2050  TROPIPOC 0.02    BNP (last 3 results) No results found for this basename: PROBNP,  in the last 8760 hours  CBG: No results found for this basename: GLUCAP,  in the last 168 hours   Radiological Exams on Admission: Dg Chest Port 1 View  04/01/2013   *RADIOLOGY REPORT*  Clinical Data:  Hypotension and weakness.  PORTABLE CHEST - 1 VIEW  Comparison: 05/30/2012.  Findings: The cardiac silhouette, mediastinal and hilar contours are normal and stable.  The lungs are clear.  No pleural effusion. The bony thorax is intact.  Remote healed rib fractures are noted.  IMPRESSION: No acute cardiopulmonary findings.   Original Report Authenticated By: Rudie Meyer, M.D.   12 lead EKG: Not done/not found on chart  Principal Problem:   Syncope Active Problems:   Sepsis   Bipolar disorder   Polysubstance abuse   HTN (hypertension)   Assessment/Plan Patient is 52 year old man with pmh as noted above who presented with fall.  He syncope and fall today seems to be related to low blood pressure.  Also patient has acute kidney injury which may be from acute lisinopril side effect worsened by dehydration. Patient does meet the criteria for sepsis with elevated white count and fevers along with low BP but source is unknown at this time(likely lungs). Admission to stepdown, IVF resuscitation and antibiotics for now. US abdomen is pending at this time (rule out acute cholecystitis). Will follow clinically. CIWA protocol in place for alcohol withdrawal.  Will check procalcitonin as well.  Repeat CMP and CBC with diff in morning. US abdomen to look for renal obstructive disease. Avod ACE inhibitor in future.  Code Status: full Family Communication: none present Disposition Plan/Anticipated LOS: guarded  Time spent: 75 minutes  Lars Mage, MD  [facility] Team 5  If 7PM-7AM, please contact night-coverage at [email or web address], password [facility] 04/02/2013, 1:25 AM

## 2013-04-02 NOTE — ED Notes (Signed)
Blood cultures drawn at 2350 not 0005 by this Clinical research associate.

## 2013-04-02 NOTE — Progress Notes (Signed)
CARE MANAGEMENT NOTE 04/02/2013  Patient:  GARRITT, MOLYNEUX   Account Number:  [id number]  Date Initiated:  04/02/2013  Documentation initiated by:  DAVIS,RHONDA  Subjective/Objective Assessment:   pt with hypotensive and syncopal episode, elevated wbc, source of sepsis?     Action/Plan:   From home is indep.in his care   Anticipated DC Date:  04/05/2013   Anticipated DC Plan:  HOME/SELF CARE  In-house referral  NA      DC Planning Services  NA      [facility] Choice  NA   Choice offered to / List presented to:  NA   DME arranged  NA      DME agency  NA     HH arranged  NA      HH agency  NA   Status of service:  In process, will continue to follow Medicare Important Message given?  NA - LOS <3 / Initial given by admissions (If response is "NO", the following Medicare IM given date fields will be blank) Date Medicare IM given:   Date Additional Medicare IM given:    Discharge Disposition:    Per UR Regulation:  Reviewed for med. necessity/level of care/duration of stay  If discussed at Long Length of Stay Meetings, dates discussed:    Comments:  [id number] Earlene Plater, RN, BSN, CCM:  CHART REVIEWED AND UPDATED.  Next chart review due on [id number]. NO DISCHARGE NEEDS PRESENT AT THIS TIME. CASE MANAGEMENT [phone]

## 2013-04-02 NOTE — Progress Notes (Signed)
[facility] PROGRESS NOTE  Johnny Banks ZOX:[id number] DOB: Feb 14, 1961 DOA: 04/01/2013 PCP: Dorrene German, MD  Assessment/Plan: Principal Problem:   Syncope Active Problems:   Sepsis   Bipolar disorder   Polysubstance abuse   HTN (hypertension)   Acute kidney injury    1. Syncope/Fall: Patient presented following 2 syncopal episodes, resulting in falls. Fortunately, he sustained no bony injuries. Per patient, he felt dizzy, rior to episodes,. The was no report of involuntary movements, tongue-biting or incontinence. In the ED, BP was found to be 57/34, but has responded to iv NS boluses, and is much improved at 99/59-97/65, this AM. Patient is mentating well, and has no focal neurologic deficit. Will bolus NS again, and monitor BP.  2. Hypotension: As described above, BP was only 57/34 at presentation. This appears to be the culprit for patient's syncopal episode, and is likely to be due to volume depletion. Managing with aggressive iv fluids. Antihypertensives have been discontinued.  3. AKI: Creatinine was 2.29 on admission, BUN 19, consistent with acute kidney injury, likely due to dehydration/volume depletion. ACE-i has been discontinued,managing with iv fluids, and creatinine has improved at 1.69 today. Following renal indices.  4. Query SIRS: Patient presented with hypotension, tachycardia, a leukocytosis of 12.7, raising suspicion for SIRS. Fortunately, CXR is negative for acute disease, as is abdominal U/S. Blood cultures are negative so far, and U/A is pending. He has remained apyrexial, wcc has normalized at 10.3 this AM and Procalciton is only 0.15. Now on empiric Vancomycin/Zosyn, day#2. Will continue until septic work up is complete, although no infection is evident at this time.  5. ETOH abuse: Patient is a rather heavy drinker, although he insists that his last ETOH intake was on 5/19/4. On CIWA protocol.  6. Tobacco Abuse: Patient smokes about a pack-pack and half of  cigarettes per day. Counseled, and commenced on Nocoderm CQ patch.  7. Hypokalemia: Repleting as indicated. 8. Right-sided chest pain: Patient has right-sided pleuritic chest pain, which he associates with fall, about 1 week ago. Will check V/Q scan, to evaluate for PE.     Code Status: Full Code. Family Communication:  Disposition Plan: To be determined.   Brief narrative: 52 year old male with past history is of traumatic brain injury, bipolar disorder and hypertension as well as ongoing alcohol abuse who presents with syncopal episode and found to be hypotensive. Patient was on his way to the grocery store and was feeling very lightheaded. He then reports falling down with LOC for few seconds. He denies any significant injury or pain from a fall. After being helped up by a nearby people he had a second syncopal episode upon standing. Again patient denied any significant injury from fall aside a small abrasion to his elbow. EMS was called and he was then brought to emergency room by EMS and found to have blood pressures as low as 57/34. An IV was started and IV fluids were given. Patient was seen 2 days ago for complaints of right shoulder pain and injuries. He states he generally has chronic pains that are recent injury where he fell on and was seen for this. After being evaluated he was noted to have elevated blood pressures and was given a prescription for 10 mg lisinopril as well as prescription for Vicodin. Patient began taking 10 mg lisinopril once a day for the past 2 days, including 04/01/13. He has also been using the Vicodin but has not taken any Vicodin today. Patient also reports that he was  a daily alcohol user drinking between 2-3, 40 ounce beers a day. Patient decided to stop after his visit to the emergency room on the 19th has not had any alcohol since that time. Patient denies any other drug use. He is a current everyday smoker. Denies any other changes in his medications. Denies  confusion, weakness or numbness in the extremities. Patient has pain in his right side of chest and shoulder along with right upper quadrant pain since 7 days after a fall. Admitted for further management.    Consultants:  N/A.   Procedures:  CXR.  X-Ray right shoulder.   Abdominal U/S.   Antibiotics:  Vancomycin 04/01/13>>>  Zosyn 04/01/13>>>  HPI/Subjective: Feels better. Has pain right side of chest on deep inspiration.   Objective: Vital signs in last 24 hours: Temp:  [97.7 F (36.5 C)-100.7 F (38.2 C)] 98.7 F (37.1 C) (05/22 0400) Pulse Rate:  [54-123] 95 (05/22 0600) Resp:  [12-24] 14 (05/22 0600) BP: (65-118)/(38-70) 99/59 mmHg (05/22 0600) SpO2:  [85 %-100 %] 98 % (05/22 0600) Weight:  [107.2 kg (236 lb 5.3 oz)] 107.2 kg (236 lb 5.3 oz) (05/22 0246) Weight change:     Intake/Output from previous day: 05/21 0701 - 05/22 0700 In: 2822.5 [P.O.:480; I.V.:892.5; IV Piggyback:1450] Out: -      Physical Exam: General: Comfortable, alert, communicative, fully oriented, not short of breath at rest.  HEENT:  No clinical pallor, no jaundice, no conjunctival injection or discharge. Hydration is fair.  NECK:  Supple, JVP not seen, no carotid bruits, no palpable lymphadenopathy, no palpable goiter. CHEST:  Clinically clear to auscultation, no wheezes, no crackles. HEART:  Sounds 1 and 2 heard, normal, regular, no murmurs. ABDOMEN:  Full, soft, diffuse discomfort, no palpable organomegaly, no palpable masses, normal bowel sounds. GENITALIA:  Not examined. . LOWER EXTREMITIES:  No pitting edema, palpable peripheral pulses. MUSCULOSKELETAL SYSTEM:  Unremarkable.  CENTRAL NERVOUS SYSTEM:  No focal neurologic deficit on gross examination.  Lab Results:  Recent Labs  04/01/13 2040 04/02/13 0348  WBC 12.7* 10.3  HGB 14.1 13.0  HCT 41.8 38.1*  PLT 162 162    Recent Labs  04/01/13 2040 04/02/13 0348  NA 136 136  K 3.8 3.1*  CL 102 102  CO2 23 21  GLUCOSE  107* 108*  BUN 19 23  CREATININE 2.29* 1.67*  CALCIUM 8.8 8.3*   Recent Results (from the past 240 hour(s))  MRSA PCR SCREENING     Status: None   Collection Time    04/02/13  2:40 AM      Result Value Range Status   MRSA by PCR NEGATIVE  NEGATIVE Final   Comment:            The GeneXpert MRSA Assay (FDA     approved for NASAL specimens     only), is one component of a     comprehensive MRSA colonization     surveillance program. It is not     intended to diagnose MRSA     infection nor to guide or     monitor treatment for     MRSA infections.     Studies/Results: US Abdomen Complete  04/02/2013   *RADIOLOGY REPORT*  Clinical Data:  Right upper quadrant pain.  ABDOMINAL ULTRASOUND COMPLETE  Comparison:  None.  Findings:  Gallbladder:  No gallstones, gallbladder wall thickening, or pericholecystic fluid.  Common Bile Duct:  Within normal limits in caliber. Measures 4 mm in diameter.  Liver: No  focal mass lesion identified.  Within normal limits in parenchymal echogenicity.  IVC:  Appears normal.  Pancreas:  Not well visualized due to overlying bowel gas.  Spleen:  Within normal limits in size and echotexture.  Right kidney:  Normal in size and parenchymal echogenicity.  No evidence of mass or hydronephrosis.  Left kidney:  Normal in size and parenchymal echogenicity.  No evidence of mass or hydronephrosis.  Abdominal Aorta:  Not well visualized due to overlying bowel gas.  IMPRESSION: Negative abdominal ultrasound.   Original Report Authenticated By: Myles Rosenthal, M.D.   Dg Chest Port 1 View  04/01/2013   *RADIOLOGY REPORT*  Clinical Data: Hypotension and weakness.  PORTABLE CHEST - 1 VIEW  Comparison: 05/30/2012.  Findings: The cardiac silhouette, mediastinal and hilar contours are normal and stable.  The lungs are clear.  No pleural effusion. The bony thorax is intact.  Remote healed rib fractures are noted.  IMPRESSION: No acute cardiopulmonary findings.   Original Report Authenticated  By: Rudie Meyer, M.D.    Medications: Scheduled Meds: . sodium chloride   Intravenous STAT  . enoxaparin (LOVENOX) injection  40 mg Subcutaneous Q24H  . gabapentin  300 mg Oral QID  . PARoxetine  20 mg Oral Daily  . piperacillin-tazobactam (ZOSYN)  IV  3.375 g Intravenous Q8H  . QUEtiapine  50 mg Oral QHS  . vancomycin  1,500 mg Intravenous Q24H   Continuous Infusions: . sodium chloride 150 mL/hr at 04/02/13 0315   PRN Meds:.HYDROcodone-acetaminophen, ondansetron (ZOFRAN) IV, ondansetron    LOS: 1 day   Marie Chow,CHRISTOPHER  [facility] Pager [phone]. If 8PM-8AM, please contact night-coverage at [email or web address], password [facility] 04/02/2013, 7:09 AM  LOS: 1 day

## 2013-04-02 NOTE — ED Notes (Signed)
Patient aware that a urine specimen is needed. Patient unable to urinate at this time.

## 2013-04-02 NOTE — Progress Notes (Signed)
  Echocardiogram 2D Echocardiogram has been performed.  Drew Herman 04/02/2013, 9:47 AM

## 2013-04-02 NOTE — Progress Notes (Signed)
ANTIBIOTIC CONSULT NOTE - INITIAL  Pharmacy Consult for Vancomycin/Zosyn Indication: Suspected PNA  No Known Allergies  Patient Measurements: Height: 6\' 1"  (185.4 cm) Weight: 236 lb 5.3 oz (107.2 kg) IBW/kg (Calculated) : 79.9   Vital Signs: Temp: 98.4 F (36.9 C) (05/22 0246) Temp src: Oral (05/22 0246) BP: 99/60 mmHg (05/22 0246) Pulse Rate: 90 (05/22 0246) Intake/Output from previous day: 05/21 0701 - 05/22 0700 In: 550 [I.V.:300; IV Piggyback:250] Out: -  Intake/Output from this shift: Total I/O In: 550 [I.V.:300; IV Piggyback:250] Out: -   Labs:  Recent Labs  04/01/13 2040  WBC 12.7*  HGB 14.1  PLT 162  CREATININE 2.29*   Estimated Creatinine Clearance: 47.9 ml/min (by C-G formula based on Cr of 2.29). No results found for this basename: VANCOTROUGH, VANCOPEAK, VANCORANDOM, GENTTROUGH, GENTPEAK, GENTRANDOM, TOBRATROUGH, TOBRAPEAK, TOBRARND, AMIKACINPEAK, AMIKACINTROU, AMIKACIN,  in the last 72 hours   Microbiology: No results found for this or any previous visit (from the past 720 hour(s)).  Medical History: Past Medical History  Diagnosis Date  . Bipolar 1 disorder   . TBI (traumatic brain injury)   . Hypertension     Medications:  Scheduled:  . sodium chloride   Intravenous STAT  . enoxaparin (LOVENOX) injection  40 mg Subcutaneous Q24H  . gabapentin  300 mg Oral QID  . PARoxetine  20 mg Oral Daily  . piperacillin-tazobactam (ZOSYN)  IV  3.375 g Intravenous Q8H  . QUEtiapine  50 mg Oral QHS  . vancomycin  1,500 mg Intravenous Q24H  . vancomycin  1,000 mg Intravenous Once   Infusions:  . sodium chloride     Assessment: 52 yo with history of traumatic brain injury, bipolar disorder and htn admitted s/p syncopal episode/hypotensive.  MD ordering Vancomycin and Zosyn for suspected PNA.  Goal of Therapy:  Vancomycin trough level 15-20 mcg/ml  Plan:   Zosyn 3.375 Gm IV q8h  Vancomycin 1Gm ER + 1Gm = 2 Gm load  Then 1500mg  IV q24h.   CrCl~38 (N)  F/U SCr/levels/cultures  Susanne Greenhouse R 04/02/2013,2:59 AM

## 2013-04-03 DIAGNOSIS — N179 Acute kidney failure, unspecified: Secondary | ICD-10-CM

## 2013-04-03 DIAGNOSIS — R55 Syncope and collapse: Secondary | ICD-10-CM

## 2013-04-03 DIAGNOSIS — F319 Bipolar disorder, unspecified: Secondary | ICD-10-CM

## 2013-04-03 DIAGNOSIS — R6520 Severe sepsis without septic shock: Secondary | ICD-10-CM

## 2013-04-03 DIAGNOSIS — A419 Sepsis, unspecified organism: Secondary | ICD-10-CM

## 2013-04-03 LAB — COMPREHENSIVE METABOLIC PANEL
Albumin: 2.4 g/dL — ABNORMAL LOW (ref 3.5–5.2)
BUN: 18 mg/dL (ref 6–23)
Calcium: 8.3 mg/dL — ABNORMAL LOW (ref 8.4–10.5)
Creatinine, Ser: 0.94 mg/dL (ref 0.50–1.35)
GFR calc Af Amer: >90 mL/min (ref >90)
Glucose, Bld: 91 mg/dL (ref 70–99)
Total Protein: 5.5 g/dL — ABNORMAL LOW (ref 6.0–8.3)

## 2013-04-03 LAB — CBC
HCT: 33.8 % — ABNORMAL LOW (ref 39.0–52.0)
Hemoglobin: 11.2 g/dL — ABNORMAL LOW (ref 13.0–17.0)
MCH: 34.5 pg — ABNORMAL HIGH (ref 26.0–34.0)
MCHC: 33.1 g/dL (ref 30.0–36.0)
RDW: 12.9 % (ref 11.5–15.5)

## 2013-04-03 MED ORDER — OXYCODONE HCL 5 MG PO TABS
5.0000 mg | ORAL_TABLET | Freq: Four times a day (QID) | ORAL | Status: DC
  Administered 2013-04-03 – 2013-04-04 (×5): 5 mg via ORAL
  Filled 2013-04-03 (×5): qty 1

## 2013-04-03 NOTE — Discharge Summary (Signed)
Physician Discharge Summary  Johnny Banks:[id number] DOB: 1961-09-02 DOA: 04/01/2013  PCP: Dorrene German, MD  Admit date: 04/01/2013 Discharge date: 04/04/2013  Time spent: 40 minutes  Recommendations for Outpatient Follow-up:  1. Follow up with primary MD.   Discharge Diagnoses:  Principal Problem:   Syncope Active Problems:   Sepsis   Bipolar disorder   Polysubstance abuse   HTN (hypertension)   Acute kidney injury   Discharge Condition: Satisfactory.   Diet recommendation: Regular.   Filed Weights   04/02/13 0246 04/03/13 0400  Weight: 107.2 kg (236 lb 5.3 oz) 112.5 kg (248 lb 0.3 oz)    History of present illness:  52 year old male with past history is of traumatic brain injury, bipolar disorder and hypertension as well as ongoing alcohol abuse who presents with syncopal episode and found to be hypotensive. Patient was on his way to the grocery store and was feeling very lightheaded. He then reports falling down with LOC for few seconds. He denies any significant injury or pain from a fall. After being helped up by a nearby people he had a second syncopal episode upon standing. Again patient denied any significant injury from fall aside a small abrasion to his elbow. EMS was called and he was then brought to emergency room by EMS and found to have blood pressures as low as 57/34. An IV was started and IV fluids were given. Patient was seen 2 days ago for complaints of right shoulder pain and injuries. He states he generally has chronic pains that are recent injury where he fell on and was seen for this. After being evaluated he was noted to have elevated blood pressures and was given a prescription for 10 mg lisinopril as well as prescription for Vicodin. Patient began taking 10 mg lisinopril once a day for the past 2 days, including 04/01/13. He has also been using the Vicodin but has not taken any Vicodin today. Patient also reports that he was a daily alcohol user drinking  between 2-3, 40 ounce beers a day. Patient decided to stop after his visit to the emergency room on the 19th has not had any alcohol since that time. Patient denies any other drug use. He is a current everyday smoker. Denies any other changes in his medications. Denies confusion, weakness or numbness in the extremities. Patient has pain in his right side of chest and shoulder along with right upper quadrant pain since 7 days after a fall. Admitted for further management.    Hospital Course:  1. Syncope/Fall: Patient presented following 2 syncopal episodes, resulting in falls. Fortunately, he sustained no bony injuries. Per patient, he felt dizzy, prior to episodes,. The was no report of involuntary movements, tongue-biting or incontinence. In the ED, BP was found to be 57/34, but responded to iv NS boluses, and by 04/02/13, was much improved at 99/59-97/65. Bolused with NS again, and patient is now normotensive. As of 04/03/13, Patient was mentating well, had no focal neurologic deficit, and had good oral intake. IV fluids were discontinued on that date, without deleterious effect.  2. Hypotension: As described above, BP was only 57/34 at presentation. This appears to be the culprit for patient's syncopal episode, and is likely due to volume depletion, as well as ACE-i therapy. Antihypertensives were of course, discontinued at presentation, and patient was managed with aggressive iv fluid therapy, with resolution.  3. AKI: Creatinine was 2.29 on admission, BUN 19, consistent with acute kidney injury, likely due to dehydration/volume depletion and  hypotension. ACE-i was discontinued, patient was managed with iv fluids, creatinine improved dramatically, and as of 04/03/13, had normalized at 0.94. Resolved.  4. Query SIRS: Patient presented with hypotension, tachycardia, a leukocytosis of 12.7, raising suspicion for SIRS. Fortunately, CXR was negative for acute disease, as was abdominal U/S. Blood cultures remained  negative and urinalysis was unremarkable. Procalciton was only 0.15. Patient remained apyrexial, wcc normalized at 10.3 by 04/03/13 AM and we suspect leukocytosis was due to hemoconcentration and margination. He was placed on empiric iv Vancomycin/Zosyn at presentation, but  A no infection was evident on septic work up, and clinical improvement was rather dramatic, antibiotics were discontinued on 04/03/13.  5. ETOH abuse: Patient is a rather heavy drinker, although he insists that his last ETOH intake was on 5/19/4. He was placed on CIWA protocol during his hospitalization, but no clinical evidence of withdrawal phenomena was documented.  6. Tobacco Abuse: Patient smokes about a pack-pack and half of cigarettes per day. Counseled, and managed with Nocoderm CQ patch.  7. Hypokalemia: Repleted as indicated.  8. Right-sided chest pain: Patient presented with right-sided pleuritic chest pain, which he associates with a fall he sustained about 1 week prior to admission. V/Q scan on 04/02/13, was a normal study. He did have tenderness to palpation along right pectoralis major and lower right rib cage. Managed with analgesics, for musculoskeletal pain, with considerable amelioration.    Procedures:  See Below.   2D Echocardiogram.   Consultations:  N/A.   Discharge Exam: Filed Vitals:   04/03/13 0800 04/03/13 1225 04/03/13 2128 04/04/13 0542  BP: 138/85 137/78 138/83 135/90  Pulse: 80 91 96 76  Temp: 97.5 F (36.4 C) 98.1 F (36.7 C) 98.5 F (36.9 C) 98.6 F (37 C)  TempSrc: Oral Oral Oral Oral  Resp: 12 16 18 16   Height:      Weight:      SpO2: 100%  100% 98%    General: Comfortable, alert, communicative, fully oriented, not short of breath at rest.  HEENT: No clinical pallor, no jaundice, no conjunctival injection or discharge. Hydration is satisfactory.  NECK: Supple, JVP not seen, no carotid bruits, no palpable lymphadenopathy, no palpable goiter.  CHEST: Clinically clear to  auscultation, no wheezes, no crackles. Tenderness right pectoralis major and right lower rib cage has improved.  HEART: Sounds 1 and 2 heard, normal, regular, no murmurs.  ABDOMEN: Full, soft, diffuse discomfort, no palpable organomegaly, no palpable masses, normal bowel sounds.  GENITALIA: Not examined. .  LOWER EXTREMITIES: No pitting edema, palpable peripheral pulses.  MUSCULOSKELETAL SYSTEM: Unremarkable.  CENTRAL NERVOUS SYSTEM: No focal neurologic deficit on gross examination.  Discharge Instructions      Discharge Orders   Future Orders Complete By Expires     Diet general  As directed     Increase activity slowly  As directed         Medication List    STOP taking these medications       HYDROcodone-acetaminophen 5-325 MG per tablet  Commonly known as:  NORCO/VICODIN     lisinopril 10 MG tablet  Commonly known as:  PRINIVIL,ZESTRIL      TAKE these medications       gabapentin 300 MG capsule  Commonly known as:  NEURONTIN  Take 300 mg by mouth 4 (four) times daily.     ibuprofen 200 MG tablet  Commonly known as:  ADVIL,MOTRIN  Take 200 mg by mouth every 6 (six) hours as needed for pain.  oxyCODONE 5 MG immediate release tablet  Commonly known as:  Oxy IR/ROXICODONE  Take 1 tablet (5 mg total) by mouth 4 (four) times daily.     PARoxetine 20 MG tablet  Commonly known as:  PAXIL  Take 20 mg by mouth daily.     QUEtiapine 50 MG tablet  Commonly known as:  SEROQUEL  Take 50 mg by mouth at bedtime.       No Known Allergies Follow-up Information   Schedule an appointment as soon as possible for a visit with Dorrene German, MD.   Contact information:   [address] [place] [address] [phone]        The results of significant diagnostics from this hospitalization (including imaging, microbiology, ancillary and laboratory) are listed below for reference.    Significant Diagnostic Studies: Dg Shoulder Right  03/30/2013   *RADIOLOGY  REPORT*  Clinical Data: Larey Seat on right side 4-5 days ago, axillary pain, prior shoulder injuries bilaterally  RIGHT SHOULDER - 2+ VIEW  Comparison: 05/10/2010  Findings: Osseous demineralization. AC joint alignment normal. No acute fracture, dislocation or bone destruction. Old fractures of the lateral right fourth and fifth ribs.  IMPRESSION: No acute right shoulder abnormalities.   Original Report Authenticated By: Ulyses Southward, M.D.   US Abdomen Complete  04/02/2013   *RADIOLOGY REPORT*  Clinical Data:  Right upper quadrant pain.  ABDOMINAL ULTRASOUND COMPLETE  Comparison:  None.  Findings:  Gallbladder:  No gallstones, gallbladder wall thickening, or pericholecystic fluid.  Common Bile Duct:  Within normal limits in caliber. Measures 4 mm in diameter.  Liver: No focal mass lesion identified.  Within normal limits in parenchymal echogenicity.  IVC:  Appears normal.  Pancreas:  Not well visualized due to overlying bowel gas.  Spleen:  Within normal limits in size and echotexture.  Right kidney:  Normal in size and parenchymal echogenicity.  No evidence of mass or hydronephrosis.  Left kidney:  Normal in size and parenchymal echogenicity.  No evidence of mass or hydronephrosis.  Abdominal Aorta:  Not well visualized due to overlying bowel gas.  IMPRESSION: Negative abdominal ultrasound.   Original Report Authenticated By: Myles Rosenthal, M.D.   Nm Pulmonary Perf And Vent  04/02/2013   *RADIOLOGY REPORT*  Clinical Data:  Chest pain, hypertension, pleurae pain.  Concern pulmonary embolism  NUCLEAR MEDICINE VENTILATION - PERFUSION LUNG SCAN  Technique:  Ventilation images were obtained in multiple projections using inhaled aerosol technetium 99 M DTPA.  Perfusion images were obtained in multiple projections after intravenous injection of Tc-64m MAA.  Radiopharmaceuticals:  Tc-46m DTPA aerosol and 4.4 mCi Tc-76m MAA.  Comparison: Chest radiograph 05/21 1004  Findings:  Ventilation:   No focal ventilation defect.   Perfusion:   No wedge shaped peripheral perfusion defects to suggest acute pulmonary embolism.  IMPRESSION: Normal ventilation perfusion scan.   Original Report Authenticated By: Genevive Bi, M.D.   Dg Chest Port 1 View  04/01/2013   *RADIOLOGY REPORT*  Clinical Data: Hypotension and weakness.  PORTABLE CHEST - 1 VIEW  Comparison: 05/30/2012.  Findings: The cardiac silhouette, mediastinal and hilar contours are normal and stable.  The lungs are clear.  No pleural effusion. The bony thorax is intact.  Remote healed rib fractures are noted.  IMPRESSION: No acute cardiopulmonary findings.   Original Report Authenticated By: Rudie Meyer, M.D.    Microbiology: Recent Results (from the past 240 hour(s))  CULTURE, BLOOD (ROUTINE X 2)     Status: None   Collection Time  04/02/13 12:04 AM      Result Value Range Status   Specimen Description BLOOD RIGHT HAND   Final   Special Requests BOTTLES DRAWN AEROBIC AND ANAEROBIC 5CC   Final   Culture  Setup Time 04/02/2013 08:54   Final   Culture     Final   Value:        BLOOD CULTURE RECEIVED NO GROWTH TO DATE CULTURE WILL BE HELD FOR 5 DAYS BEFORE ISSUING A FINAL NEGATIVE REPORT   Report Status PENDING   Incomplete  CULTURE, BLOOD (ROUTINE X 2)     Status: None   Collection Time    04/02/13 12:05 AM      Result Value Range Status   Specimen Description BLOOD RIGHT ARM   Final   Special Requests BOTTLES DRAWN AEROBIC AND ANAEROBIC 3CC   Final   Culture  Setup Time 04/02/2013 08:54   Final   Culture     Final   Value:        BLOOD CULTURE RECEIVED NO GROWTH TO DATE CULTURE WILL BE HELD FOR 5 DAYS BEFORE ISSUING A FINAL NEGATIVE REPORT   Report Status PENDING   Incomplete  MRSA PCR SCREENING     Status: None   Collection Time    04/02/13  2:40 AM      Result Value Range Status   MRSA by PCR NEGATIVE  NEGATIVE Final   Comment:            The GeneXpert MRSA Assay (FDA     approved for NASAL specimens     only), is one component of a      comprehensive MRSA colonization     surveillance program. It is not     intended to diagnose MRSA     infection nor to guide or     monitor treatment for     MRSA infections.     Labs: Basic Metabolic Panel:  Recent Labs Lab 04/01/13 2040 04/02/13 0348 04/03/13 0336 04/04/13 0535  NA 136 136 141 140  K 3.8 3.1* 4.4 4.5  CL 102 102 113* 106  CO2 23 21 23 26   GLUCOSE 107* 108* 91 97  BUN 19 23 18 10   CREATININE 2.29* 1.67* 0.94 0.78  CALCIUM 8.8 8.3* 8.3* 9.0  MG  --  1.9 1.7  --    Liver Function Tests:  Recent Labs Lab 04/01/13 2040 04/02/13 0348 04/03/13 0336  AST 20 18 13   ALT 14 13 10   ALKPHOS 56 50 46  BILITOT 1.5* 1.3* 0.5  PROT 6.9 6.3 5.5*  ALBUMIN 3.2* 2.8* 2.4*   No results found for this basename: LIPASE, AMYLASE,  in the last 168 hours No results found for this basename: AMMONIA,  in the last 168 hours CBC:  Recent Labs Lab 04/01/13 2040 04/02/13 0348 04/03/13 0336 04/04/13 0535  WBC 12.7* 10.3 8.0 7.3  NEUTROABS 9.5* 6.5  --   --   HGB 14.1 13.0 11.2* 10.8*  HCT 41.8 38.1* 33.8* 32.6*  MCV 100.0 101.1* 104.0* 103.2*  PLT 162 162 156 183   Cardiac Enzymes: No results found for this basename: CKTOTAL, CKMB, CKMBINDEX, TROPONINI,  in the last 168 hours BNP: BNP (last 3 results) No results found for this basename: PROBNP,  in the last 8760 hours CBG: No results found for this basename: GLUCAP,  in the last 168 hours     Signed:  Mariaguadalupe Fialkowski,CHRISTOPHER  [facility] 04/04/2013, 9:57 AM

## 2013-04-03 NOTE — Clinical Social Work Psychosocial (Signed)
Clinical Social Work Department BRIEF PSYCHOSOCIAL ASSESSMENT 04/03/2013  Patient:  Johnny Banks, Johnny Banks     Account Number:  [id number]     Admit date:  04/01/2013  Clinical Social Worker:  Vennie Homans, Theresia Majors  Date/Time:  04/03/2013 01:00 PM  Referred by:  RN  Date Referred:  04/02/2013 Referred for  Homelessness   Other Referral:   Interview type:  Patient Other interview type:   RN    PSYCHOSOCIAL DATA Living Status:  OTHER Admitted from facility:   Level of care:   Primary support name:  Johnny Banks Primary support relationship to patient:  FRIEND Degree of support available:   good    CURRENT CONCERNS Current Concerns  Other - See comment   Other Concerns:   Needs bus pass at time of d/c    SOCIAL WORK ASSESSMENT / PLAN CSW met with Pt due to Rn feeling Pt was homeless. Pt lives with a good friend in an apt. He has great Personal assistant and denies current concerns. Goes to [facility] for bipolar meds and has good support systems. Pt has attempted to stop drinking and is aware of local resources to stop. He only wants a bus pass home and any housing info that CSW has.   Assessment/plan status:  Referral to [facility] Other assessment/ plan:   needs bus pass   Information/referral to community resources:   will provide housing list.    PATIENT'S/FAMILY'S RESPONSE TO PLAN OF CARE: Pt has stable housing. Lives with his friend Johnny Banks, but is hopeful to get own appt. He seems very well aware of local resources. CSW to help with bus pass at d/c.

## 2013-04-03 NOTE — Progress Notes (Signed)
[facility] PROGRESS NOTE  TRAVAS SCHEXNAYDER ZOX:[id number] DOB: August 08, 1961 DOA: 04/01/2013 PCP: Dorrene German, MD  Assessment/Plan: Principal Problem:   Syncope Active Problems:   Sepsis   Bipolar disorder   Polysubstance abuse   HTN (hypertension)   Acute kidney injury    1. Syncope/Fall: Patient presented following 2 syncopal episodes, resulting in falls. Fortunately, he sustained no bony injuries. Per patient, he felt dizzy, prior to episodes,. The was no report of involuntary movements, tongue-biting or incontinence. In the ED, BP was found to be 57/34, but has responded to iv NS boluses, and is much improved at 99/59-97/65 on 04/02/13. Bolused with NS again, and patient is now normotensive. Patient is mentating well, and has no focal neurologic deficit, has good oral intake. Have discontinued iv fluids today.  2. Hypotension: As described above, BP was only 57/34 at presentation. This appears to be the culprit for patient's syncopal episode, and is likely to be due to volume depletion, as well as ACE-i therapy. Antihypertensives have been discontinued. Managed with aggressive iv fluid therapy, with resolution.  3. AKI: Creatinine was 2.29 on admission, BUN 19, consistent with acute kidney injury, likely due to dehydration/volume depletion and hypotension. ACE-i was discontinued, managed with iv fluids, creatinine improved dramatically, and is normal at at 0.94 today. Resolved.  4. Query SIRS: Patient presented with hypotension, tachycardia, a leukocytosis of 12.7, raising suspicion for SIRS. Fortunately, CXR is negative for acute disease, as is abdominal U/S. Blood cultures are negative so far, as is U/A. He has remained apyrexial, wcc has normalized at 10.3 this AM and we suspect leukocytosis was due to hemoconcentration and margination. Procalciton is only 0.15. Now on empiric Vancomycin/Zosyn, day#3. No infection is evident at this time. Will discontinue antibiotics after last dose  today.  5. ETOH abuse: Patient is a rather heavy drinker, although he insists that his last ETOH intake was on 5/19/4. On CIWA protocol. No clinical evidence of withdrawal phenomena at this time.  6. Tobacco Abuse: Patient smokes about a pack-pack and half of cigarettes per day. Counseled, and commenced on Nocoderm CQ patch.  7. Hypokalemia: Repleting as indicated. 8. Right-sided chest pain: Patient has right-sided pleuritic chest pain, which he associates with fall, about 1 week prior to admission. V/Q scan on 04/02/13, was a normal study. Patient has tenderness to palpation along right pectoralis major and lower right rib cage. Managing with analgesics, for musculoskeletal pain. PT/OT. Marland Kitchen     Code Status: Full Code. Family Communication:  Disposition Plan: To be determined. Transferred to med surg floor today. Aiming possible discharge on 04/04/13.    Brief narrative: 52 year old male with past history is of traumatic brain injury, bipolar disorder and hypertension as well as ongoing alcohol abuse who presents with syncopal episode and found to be hypotensive. Patient was on his way to the grocery store and was feeling very lightheaded. He then reports falling down with LOC for few seconds. He denies any significant injury or pain from a fall. After being helped up by a nearby people he had a second syncopal episode upon standing. Again patient denied any significant injury from fall aside a small abrasion to his elbow. EMS was called and he was then brought to emergency room by EMS and found to have blood pressures as low as 57/34. An IV was started and IV fluids were given. Patient was seen 2 days ago for complaints of right shoulder pain and injuries. He states he generally has chronic pains that are  recent injury where he fell on and was seen for this. After being evaluated he was noted to have elevated blood pressures and was given a prescription for 10 mg lisinopril as well as prescription for  Vicodin. Patient began taking 10 mg lisinopril once a day for the past 2 days, including 04/01/13. He has also been using the Vicodin but has not taken any Vicodin today. Patient also reports that he was a daily alcohol user drinking between 2-3, 40 ounce beers a day. Patient decided to stop after his visit to the emergency room on the 19th has not had any alcohol since that time. Patient denies any other drug use. He is a current everyday smoker. Denies any other changes in his medications. Denies confusion, weakness or numbness in the extremities. Patient has pain in his right side of chest and shoulder along with right upper quadrant pain since 7 days after a fall. Admitted for further management.    Consultants:  N/A.   Procedures:  CXR.  X-Ray right shoulder.   Abdominal U/S.   Antibiotics:  Vancomycin 04/01/13-04/03/13  Zosyn 04/01/13-04/03/13.   HPI/Subjective: Feels better. No new issues.   Objective: Vital signs in last 24 hours: Temp:  [97.5 F (36.4 C)-99 F (37.2 C)] 97.5 F (36.4 C) (05/23 0400) Pulse Rate:  [73-99] 80 (05/23 0800) Resp:  [11-21] 12 (05/23 0800) BP: (113-151)/(70-85) 138/85 mmHg (05/23 0800) SpO2:  [98 %-100 %] 100 % (05/23 0800) Weight:  [112.5 kg (248 lb 0.3 oz)] 112.5 kg (248 lb 0.3 oz) (05/23 0400) Weight change: 5.3 kg (11 lb 11 oz) Last BM Date: 04/02/13  Intake/Output from previous day: 05/22 0701 - 05/23 0700 In: 5877.5 [P.O.:1680; I.V.:3547.5; IV Piggyback:650] Out: 2076 [Urine:2075; Stool:1] Total I/O In: 50 [IV Piggyback:50] Out: 350 [Urine:350]   Physical Exam: General: Comfortable, alert, communicative, fully oriented, not short of breath at rest.  HEENT:  No clinical pallor, no jaundice, no conjunctival injection or discharge. Hydration is satisfactory.  NECK:  Supple, JVP not seen, no carotid bruits, no palpable lymphadenopathy, no palpable goiter. CHEST:  Clinically clear to auscultation, no wheezes, no crackles. Has  tenderness right pectoralis major and right lower rib cage.  HEART:  Sounds 1 and 2 heard, normal, regular, no murmurs. ABDOMEN:  Full, soft, diffuse discomfort, no palpable organomegaly, no palpable masses, normal bowel sounds. GENITALIA:  Not examined. . LOWER EXTREMITIES:  No pitting edema, palpable peripheral pulses. MUSCULOSKELETAL SYSTEM:  Unremarkable.  CENTRAL NERVOUS SYSTEM:  No focal neurologic deficit on gross examination.  Lab Results:  Recent Labs  04/02/13 0348 04/03/13 0336  WBC 10.3 8.0  HGB 13.0 11.2*  HCT 38.1* 33.8*  PLT 162 156    Recent Labs  04/02/13 0348 04/03/13 0336  NA 136 141  K 3.1* 4.4  CL 102 113*  CO2 21 23  GLUCOSE 108* 91  BUN 23 18  CREATININE 1.67* 0.94  CALCIUM 8.3* 8.3*   Recent Results (from the past 240 hour(s))  CULTURE, BLOOD (ROUTINE X 2)     Status: None   Collection Time    04/02/13 12:04 AM      Result Value Range Status   Specimen Description BLOOD RIGHT HAND   Final   Special Requests BOTTLES DRAWN AEROBIC AND ANAEROBIC 5CC   Final   Culture  Setup Time 04/02/2013 08:54   Final   Culture     Final   Value:        BLOOD CULTURE RECEIVED NO GROWTH TO  DATE CULTURE WILL BE HELD FOR 5 DAYS BEFORE ISSUING A FINAL NEGATIVE REPORT   Report Status PENDING   Incomplete  CULTURE, BLOOD (ROUTINE X 2)     Status: None   Collection Time    04/02/13 12:05 AM      Result Value Range Status   Specimen Description BLOOD RIGHT ARM   Final   Special Requests BOTTLES DRAWN AEROBIC AND ANAEROBIC 3CC   Final   Culture  Setup Time 04/02/2013 08:54   Final   Culture     Final   Value:        BLOOD CULTURE RECEIVED NO GROWTH TO DATE CULTURE WILL BE HELD FOR 5 DAYS BEFORE ISSUING A FINAL NEGATIVE REPORT   Report Status PENDING   Incomplete  MRSA PCR SCREENING     Status: None   Collection Time    04/02/13  2:40 AM      Result Value Range Status   MRSA by PCR NEGATIVE  NEGATIVE Final   Comment:            The GeneXpert MRSA Assay (FDA      approved for NASAL specimens     only), is one component of a     comprehensive MRSA colonization     surveillance program. It is not     intended to diagnose MRSA     infection nor to guide or     monitor treatment for     MRSA infections.     Studies/Results: US Abdomen Complete  04/02/2013   *RADIOLOGY REPORT*  Clinical Data:  Right upper quadrant pain.  ABDOMINAL ULTRASOUND COMPLETE  Comparison:  None.  Findings:  Gallbladder:  No gallstones, gallbladder wall thickening, or pericholecystic fluid.  Common Bile Duct:  Within normal limits in caliber. Measures 4 mm in diameter.  Liver: No focal mass lesion identified.  Within normal limits in parenchymal echogenicity.  IVC:  Appears normal.  Pancreas:  Not well visualized due to overlying bowel gas.  Spleen:  Within normal limits in size and echotexture.  Right kidney:  Normal in size and parenchymal echogenicity.  No evidence of mass or hydronephrosis.  Left kidney:  Normal in size and parenchymal echogenicity.  No evidence of mass or hydronephrosis.  Abdominal Aorta:  Not well visualized due to overlying bowel gas.  IMPRESSION: Negative abdominal ultrasound.   Original Report Authenticated By: Myles Rosenthal, M.D.   Nm Pulmonary Perf And Vent  04/02/2013   *RADIOLOGY REPORT*  Clinical Data:  Chest pain, hypertension, pleurae pain.  Concern pulmonary embolism  NUCLEAR MEDICINE VENTILATION - PERFUSION LUNG SCAN  Technique:  Ventilation images were obtained in multiple projections using inhaled aerosol technetium 99 M DTPA.  Perfusion images were obtained in multiple projections after intravenous injection of Tc-76m MAA.  Radiopharmaceuticals:  Tc-26m DTPA aerosol and 4.4 mCi Tc-84m MAA.  Comparison: Chest radiograph 05/21 1004  Findings:  Ventilation:   No focal ventilation defect.  Perfusion:   No wedge shaped peripheral perfusion defects to suggest acute pulmonary embolism.  IMPRESSION: Normal ventilation perfusion scan.   Original Report  Authenticated By: Genevive Bi, M.D.   Dg Chest Port 1 View  04/01/2013   *RADIOLOGY REPORT*  Clinical Data: Hypotension and weakness.  PORTABLE CHEST - 1 VIEW  Comparison: 05/30/2012.  Findings: The cardiac silhouette, mediastinal and hilar contours are normal and stable.  The lungs are clear.  No pleural effusion. The bony thorax is intact.  Remote healed rib fractures are noted.  IMPRESSION: No acute cardiopulmonary findings.   Original Report Authenticated By: Rudie Meyer, M.D.    Medications: Scheduled Meds: . enoxaparin (LOVENOX) injection  40 mg Subcutaneous Q24H  . gabapentin  300 mg Oral QID  . nicotine  21 mg Transdermal Daily  . PARoxetine  20 mg Oral Daily  . piperacillin-tazobactam (ZOSYN)  IV  3.375 g Intravenous Q8H  . pneumococcal 23 valent vaccine  0.5 mL Intramuscular Tomorrow-1000  . QUEtiapine  50 mg Oral QHS  . vancomycin  1,500 mg Intravenous Q24H   Continuous Infusions: . sodium chloride 150 mL/hr at 04/03/13 0530   PRN Meds:.HYDROcodone-acetaminophen, ondansetron (ZOFRAN) IV, ondansetron    LOS: 2 days   Camilia Caywood,CHRISTOPHER  [facility] Pager [phone]. If 8PM-8AM, please contact night-coverage at [email or web address], password [facility] 04/03/2013, 8:38 AM  LOS: 2 days

## 2013-04-04 DIAGNOSIS — I959 Hypotension, unspecified: Secondary | ICD-10-CM

## 2013-04-04 LAB — CBC
MCV: 103.2 fL — ABNORMAL HIGH (ref 78.0–100.0)
Platelets: 183 10*3/uL (ref 150–400)
RBC: 3.16 MIL/uL — ABNORMAL LOW (ref 4.22–5.81)
RDW: 12.7 % (ref 11.5–15.5)
WBC: 7.3 10*3/uL (ref 4.0–10.5)

## 2013-04-04 LAB — BASIC METABOLIC PANEL
CO2: 26 mEq/L (ref 19–32)
Chloride: 106 mEq/L (ref 96–112)
Creatinine, Ser: 0.78 mg/dL (ref 0.50–1.35)
GFR calc Af Amer: >90 mL/min (ref >90)
Sodium: 140 mEq/L (ref 135–145)

## 2013-04-04 LAB — PROCALCITONIN: Procalcitonin: <0.1 ng/mL

## 2013-04-04 MED ORDER — OXYCODONE HCL 5 MG PO TABS: 5.0000 mg | ORAL_TABLET | Freq: Four times a day (QID) | ORAL | Status: DC

## 2013-04-04 NOTE — Evaluation (Signed)
Occupational Therapy Evaluation Patient Details Name: Johnny Banks MRN: [id number] DOB: [birth year] Today's Date: 04/04/2013 Time: 4098-1191 OT Time Calculation (min): 8 min  OT Assessment / Plan / Recommendation Clinical Impression  This 52 year old man was admitted with syncope/fall.  He is supervision for adls/bathroom transfers.  All education was completed.  Pt does not need any further OT at this time.      OT Assessment  Patient does not need any further OT services    Follow Up Recommendations  No OT follow up    Barriers to Discharge      Equipment Recommendations  None recommended by OT    Recommendations for Other Services    Frequency       Precautions / Restrictions Precautions Precautions: None Restrictions Weight Bearing Restrictions: No   Pertinent Vitals/Pain No pain unless he moves RUE "wrong"    ADL  Transfers/Ambulation Related to ADLs: pt ambulated in room and in hall with supervision; no LOB ADL Comments: Simulated washing feet in shower.  Pt usually holds onto bar on sliding glass doors.  Recommended that he hold to wall instead.  Also, pt hurt R shoulder in previous fall.  He can lift to 90 degrees.  Educated to don this arm into shirt first.      OT Diagnosis:    OT Problem List:   OT Treatment Interventions:     OT Goals    Visit Information  Last OT Received On: 04/04/13 Assistance Needed: +1 PT/OT Co-Evaluation/Treatment: Yes    Subjective Data  Subjective: I'm going to be going home at 1:00 Patient Stated Goal: none stated; agreeable to OT/PT   Prior Functioning     Home Living Lives With: Other (Comment) (roommate) Available Help at Discharge: Friend(s);Available PRN/intermittently Type of Home: Apartment Home Access: Level entry Home Layout: One level Bathroom Shower/Tub: Health visitor: Standard Home Adaptive Equipment: None Prior Function Level of Independence: Independent Able to Take Stairs?:  Yes Communication Communication: No difficulties Dominant Hand: Right (can use left for some things)         Vision/Perception     Cognition  Cognition Arousal/Alertness: Awake/alert Behavior During Therapy: WFL for tasks assessed/performed Overall Cognitive Status: Within Functional Limits for tasks assessed    Extremity/Trunk Assessment Right Upper Extremity Assessment RUE ROM/Strength/Tone: Deficits RUE ROM/Strength/Tone Deficits: able to lift to 90 degrees with min pain.  Educated to move in pain free range.  Distally wfls     Mobility Bed Mobility Bed Mobility: Supine to Sit Supine to Sit: 7: Independent Transfers Sit to Stand: 5: Supervision;From bed Stand to Sit: 5: Supervision;To chair/3-in-1 Details for Transfer Assistance: Supervision for safety.      Exercise     Balance     End of Session OT - End of Session Activity Tolerance: Patient tolerated treatment well Patient left: in chair;with call bell/phone within reach  GO     Johnny Banks 04/04/2013, 1:06 PM Marica Otter, OTR/L [phone] 04/04/2013

## 2013-04-04 NOTE — Progress Notes (Signed)
Patient discharge to home, alert and oriented, ambulatory. D/c instructions and follow up appointments done and was given to the patient. PIV removed no s/s of swelling or infiltration noted.

## 2013-04-04 NOTE — Evaluation (Signed)
Physical Therapy Evaluation Patient Details Name: Johnny Banks MRN: [id number] DOB: 1961-07-14 Today's Date: 04/04/2013 Time: 4098-1191 PT Time Calculation (min): 14 min  PT Assessment / Plan / Recommendation Clinical Impression  Pt presents with syncope and fall.  Note history of TBI, bipolar disorder and ETOH abuse.  Tolerated OOB and ambulation around full unit without LOB noted.  Pt currently at supervision level for all mobility for safety and states he will have intermittent assist at home from roommate.  Pt will not require any further follow up in hospital or at home.  PT to sign off at this time.     PT Assessment  Patent does not need any further PT services    Follow Up Recommendations  No PT follow up    Does the patient have the potential to tolerate intense rehabilitation      Barriers to Discharge        Equipment Recommendations       Recommendations for Other Services     Frequency      Precautions / Restrictions Precautions Precautions: None Restrictions Weight Bearing Restrictions: No   Pertinent Vitals/Pain Some pain in R shoulder and R side of chest from previous fall.       Mobility  Bed Mobility Bed Mobility: Supine to Sit Supine to Sit: 7: Independent Transfers Transfers: Sit to Stand;Stand to Sit Sit to Stand: 5: Supervision;From bed Stand to Sit: 5: Supervision;To chair/3-in-1 Details for Transfer Assistance: Supervision for safety.  Ambulation/Gait Ambulation/Gait Assistance: 5: Supervision Ambulation Distance (Feet): 500 Feet Assistive device: None Ambulation/Gait Assistance Details: Noted pt to have somewhat Trendelenburg like gait pattern with wide BOS, however no LOB noted during ambulation.  Gait Pattern: Trendelenburg;Wide base of support;Step-through pattern Gait velocity: WFL Stairs: No Wheelchair Mobility Wheelchair Mobility: No    Exercises     PT Diagnosis:    PT Problem List:   PT Treatment Interventions:     PT  Goals    Visit Information  Last PT Received On: 04/04/13 Assistance Needed: +1    Subjective Data  Subjective: They are letting me go home today. Patient Stated Goal: to return home.    Prior Functioning  Home Living Lives With: Other (Comment) (roommate) Available Help at Discharge: Friend(s);Available PRN/intermittently Type of Home: Apartment Home Access: Level entry Home Layout: One level Bathroom Shower/Tub: Health visitor: Standard Home Adaptive Equipment: None Prior Function Level of Independence: Independent Able to Take Stairs?: Yes Communication Communication: No difficulties Dominant Hand: Right (can use left for some things)    Cognition  Cognition Arousal/Alertness: Awake/alert Behavior During Therapy: WFL for tasks assessed/performed Overall Cognitive Status: Within Functional Limits for tasks assessed    Extremity/Trunk Assessment Right Lower Extremity Assessment RLE ROM/Strength/Tone: WFL for tasks assessed RLE Sensation: WFL - Light Touch Left Lower Extremity Assessment LLE ROM/Strength/Tone: WFL for tasks assessed LLE Sensation: WFL - Light Touch   Balance    End of Session PT - End of Session Activity Tolerance: Patient tolerated treatment well Patient left: in chair;with call bell/phone within reach Nurse Communication: Mobility status  GP     Vista Deck 04/04/2013, 11:31 AM

## 2013-04-08 LAB — CULTURE, BLOOD (ROUTINE X 2)
Culture: NO GROWTH
Culture: NO GROWTH

## 2013-09-11 ENCOUNTER — Encounter (HOSPITAL_COMMUNITY): Payer: Self-pay | Admitting: Emergency Medicine

## 2013-09-11 ENCOUNTER — Emergency Department (HOSPITAL_COMMUNITY)
Admission: EM | Admit: 2013-09-11 | Discharge: 2013-09-11 | Disposition: A | Discharge status: Home / Self Care | Payer: Medicaid Other | Attending: Emergency Medicine | Admitting: Emergency Medicine

## 2013-09-11 ENCOUNTER — Emergency Department (HOSPITAL_COMMUNITY): Payer: Medicaid Other

## 2013-09-11 DIAGNOSIS — R0602 Shortness of breath: Secondary | ICD-10-CM | POA: Insufficient documentation

## 2013-09-11 DIAGNOSIS — J029 Acute pharyngitis, unspecified: Secondary | ICD-10-CM | POA: Insufficient documentation

## 2013-09-11 DIAGNOSIS — R079 Chest pain, unspecified: Secondary | ICD-10-CM | POA: Insufficient documentation

## 2013-09-11 DIAGNOSIS — R040 Epistaxis: Secondary | ICD-10-CM | POA: Insufficient documentation

## 2013-09-11 DIAGNOSIS — I10 Essential (primary) hypertension: Secondary | ICD-10-CM | POA: Insufficient documentation

## 2013-09-11 DIAGNOSIS — Z87820 Personal history of traumatic brain injury: Secondary | ICD-10-CM | POA: Insufficient documentation

## 2013-09-11 DIAGNOSIS — F17200 Nicotine dependence, unspecified, uncomplicated: Secondary | ICD-10-CM | POA: Insufficient documentation

## 2013-09-11 DIAGNOSIS — R059 Cough, unspecified: Secondary | ICD-10-CM | POA: Insufficient documentation

## 2013-09-11 DIAGNOSIS — Z79899 Other long term (current) drug therapy: Secondary | ICD-10-CM | POA: Insufficient documentation

## 2013-09-11 DIAGNOSIS — F319 Bipolar disorder, unspecified: Secondary | ICD-10-CM | POA: Insufficient documentation

## 2013-09-11 DIAGNOSIS — R062 Wheezing: Secondary | ICD-10-CM | POA: Insufficient documentation

## 2013-09-11 DIAGNOSIS — R05 Cough: Secondary | ICD-10-CM | POA: Insufficient documentation

## 2013-09-11 MED ORDER — GUAIFENESIN-CODEINE 100-10 MG/5ML PO SOLN
10.0000 mL | Freq: Once | ORAL | Status: AC
  Administered 2013-09-11: 10 mL via ORAL
  Filled 2013-09-11: qty 10

## 2013-09-11 MED ORDER — ALBUTEROL SULFATE HFA 108 (90 BASE) MCG/ACT IN AERS
2.0000 | INHALATION_SPRAY | RESPIRATORY_TRACT | Status: DC | PRN
  Filled 2013-09-11: qty 6.7

## 2013-09-11 MED ORDER — GUAIFENESIN 100 MG/5ML PO LIQD: 100.0000 mg | ORAL | Status: DC | PRN

## 2013-09-11 MED ORDER — BENZONATATE 100 MG PO CAPS
100.0000 mg | ORAL_CAPSULE | Freq: Once | ORAL | Status: AC
  Administered 2013-09-11: 100 mg via ORAL
  Filled 2013-09-11: qty 1

## 2013-09-11 MED ORDER — BENZONATATE 100 MG PO CAPS: 100.0000 mg | ORAL_CAPSULE | Freq: Three times a day (TID) | ORAL | Status: DC

## 2013-09-11 NOTE — ED Provider Notes (Signed)
CSN: [id number]     Arrival date & time 09/11/13  1349 History  This chart was scribed for non-physician practitioner Fayrene Helper, PA-C working with Doug Sou, MD by Caryn Bee, ED Scribe. This patient was seen in room WTR6/WTR6 and the patient's care was started at 2:11 PM.    Chief Complaint  Patient presents with  . Cough   HPI HPI Comments: Johnny Banks is a 52 y.o. male who presents to the Emergency Department complaining of gradual onset dry cough that began a couple days ago. Pt states that lying down worsens the cough. He initially thought that he had a postnasal drip that may have caused the cough. He tried a nasal decongestant with no relief. Pt states that his nose was very congested the other night. When he blew his nose the tissue was full of blood. Pt reports two nosebleeds over the past couple of nights. He complains of associated nasal soreness. He also complains of throat irritation and chest pain associated with the coughing. Pt denies leg swelling, fever, chills, or feeling sick. Pt has h/o hip surgery. No recent hip surgery in the past 6 months. He has re-injured his left hip a couple of months ago. He suffers from constant pain due to the recent hip injury. Pt has associated SOB due to over exertion of hip when walking. Pt takes prescription paxil and seroquel, but has not been taking the seroquel lately because he was homeless until recently and unable to get the medication. Pt does not take medications for HTN currently. He used to take prescription lisinopril. Pt states that he was prescribed lisinopril for HTN during an ED visit previously, but fainted 3 days later and the prescription was discontinued. Pt is a smoker but has not smoked in 3 days. Pt reports h/o sinus issues.   Past Medical History  Diagnosis Date  . Bipolar 1 disorder   . TBI (traumatic brain injury)   . Hypertension    Past Surgical History  Procedure Laterality Date  . Hip fracture surgery     . Appendectomy     Family History  Problem Relation Age of Onset  . Asthma Mother   . Heart failure Mother   . Hypertension Father   . Depression Sister   . Alcohol abuse Other    History  Substance Use Topics  . Smoking status: Current Every Day Smoker -- 2.00 packs/day    Types: Cigarettes  . Smokeless tobacco: Never Used  . Alcohol Use: Yes     Comment: drinks 3 40 ounce bottles beer    Review of Systems  Constitutional: Negative for fever and chills.  HENT: Positive for nosebleeds, rhinorrhea and sore throat.   Respiratory: Positive for cough.   Cardiovascular: Negative for leg swelling.    Allergies  Review of patient's allergies indicates no known allergies.  Home Medications   Current Outpatient Rx  Name  Route  Sig  Dispense  Refill  . gabapentin (NEURONTIN) 300 MG capsule   Oral   Take 300 mg by mouth 4 (four) times daily.         Marland Kitchen ibuprofen (ADVIL,MOTRIN) 200 MG tablet   Oral   Take 200 mg by mouth every 6 (six) hours as needed for pain.         Marland Kitchen oxyCODONE (OXY IR/ROXICODONE) 5 MG immediate release tablet   Oral   Take 1 tablet (5 mg total) by mouth 4 (four) times daily.   20 tablet  0   . PARoxetine (PAXIL) 20 MG tablet   Oral   Take 20 mg by mouth daily.          . QUEtiapine (SEROQUEL) 50 MG tablet   Oral   Take 50 mg by mouth at bedtime.          BP 122/74  Pulse 97  Temp(Src) 98.7 F (37.1 C) (Oral)  Resp 20  SpO2 98%  Physical Exam  Nursing note and vitals reviewed. Constitutional: He is oriented to person, place, and time. He appears well-developed and well-nourished. No distress.  HENT:  Head: Normocephalic and atraumatic.  Mouth/Throat: Oropharynx is clear and moist.  Eyes: EOM are normal.  Neck: Neck supple. No tracheal deviation present.  Cardiovascular: Normal rate, regular rhythm, normal heart sounds and intact distal pulses.  Exam reveals no gallop and no friction rub.   No murmur heard. Pulmonary/Chest:  Effort normal. No respiratory distress. He has wheezes. He has no rales.  Mild expiratory wheezes. No obvious rhonchi or rales.   Abdominal: There is no tenderness.  Musculoskeletal: Normal range of motion.  BLE: no palpable cords, erythema, edema, negative homan sign  Neurological: He is alert and oriented to person, place, and time.  Skin: Skin is warm and dry.  Psychiatric: He has a normal mood and affect. His behavior is normal.    ED Course  Procedures (including critical care time) DIAGNOSTIC STUDIES: Oxygen Saturation is 98% on room air, normal by my interpretation.    COORDINATION OF CARE: 2:18 PM-Discussed treatment plan which includes CXR with pt at bedside and pt agreed to plan.   3:22 PM Non productive cough.  No on any ACEi.  No significant risk factors for DVT/PE.  Is not tachycardic or tachypneic.  CXR with evidence of emphysema but without evidence of PNA.  Will d/c with cough medication, referral to pcp, and return precaution.    Labs Review Labs Reviewed - No data to display Imaging Review Dg Chest 2 View  09/11/2013   CLINICAL DATA:  Cough and chest pain.  EXAM: CHEST  2 VIEW  COMPARISON:  The heart size is normal. Slight curvature of the thoracic spine to the right is noted. No focal airspace disease is present. Mild emphysematous changes are noted. Remote right-sided rib fractures are again noted.  FINDINGS: The heart size is normal. Mild emphysematous changes are noted. No focal airspace disease is evident. Mild rightward curvature is present. Multiple healed anterior right-sided rib fractures are noted.  IMPRESSION: The  1. No acute cardiopulmonary disease. 2. Emphysema.   Electronically Signed   By: Gennette Pac M.D.   On: 09/11/2013 15:07    EKG Interpretation   None       MDM   1. Cough    BP 129/73  Pulse 72  Temp(Src) 98.4 F (36.9 C) (Oral)  Resp 20  SpO2 98%  I have reviewed nursing notes and vital signs. I personally reviewed the imaging  tests through PACS system  I reviewed available ER/hospitalization records thought the EMR   I personally performed the services described in this documentation, which was scribed in my presence. The recorded information has been reviewed and is accurate.     Fayrene Helper, PA-C 09/11/13 1524

## 2013-09-11 NOTE — ED Provider Notes (Signed)
Medical screening examination/treatment/procedure(s) were performed by non-physician practitioner and as supervising physician I was immediately available for consultation/collaboration.  EKG Interpretation   None        Doug Sou, MD 09/11/13 [phone]

## 2013-09-11 NOTE — ED Notes (Signed)
Pt c/o bad cough for the past couple of days to the point where he cannot smoke a cigarette. States at night it gets worse and he feels congested, has had two nose bleeds over the past couple days as well. Pt denies any pain.

## 2013-10-15 ENCOUNTER — Ambulatory Visit (HOSPITAL_COMMUNITY)
Admission: RE | Admit: 2013-10-15 | Discharge: 2013-10-15 | Disposition: A | Discharge status: Home / Self Care | Payer: Medicaid Other | Source: Ambulatory Visit | Attending: Internal Medicine | Admitting: Internal Medicine

## 2013-10-15 ENCOUNTER — Other Ambulatory Visit (HOSPITAL_COMMUNITY): Payer: Self-pay | Admitting: Internal Medicine

## 2013-10-15 DIAGNOSIS — R52 Pain, unspecified: Secondary | ICD-10-CM

## 2013-10-15 DIAGNOSIS — X58XXXA Exposure to other specified factors, initial encounter: Secondary | ICD-10-CM | POA: Insufficient documentation

## 2013-10-15 DIAGNOSIS — M25559 Pain in unspecified hip: Secondary | ICD-10-CM | POA: Insufficient documentation

## 2013-10-15 DIAGNOSIS — S32409A Unspecified fracture of unspecified acetabulum, initial encounter for closed fracture: Secondary | ICD-10-CM | POA: Insufficient documentation

## 2013-10-15 DIAGNOSIS — M259 Joint disorder, unspecified: Secondary | ICD-10-CM | POA: Insufficient documentation

## 2015-09-22 ENCOUNTER — Emergency Department (HOSPITAL_COMMUNITY): Payer: Medicaid Other

## 2015-09-22 ENCOUNTER — Encounter (HOSPITAL_COMMUNITY): Payer: Self-pay | Admitting: Emergency Medicine

## 2015-09-22 ENCOUNTER — Emergency Department (HOSPITAL_COMMUNITY)
Admission: EM | Admit: 2015-09-22 | Discharge: 2015-09-23 | Disposition: A | Discharge status: Home / Self Care | Payer: Medicaid Other | Attending: Emergency Medicine | Admitting: Emergency Medicine

## 2015-09-22 DIAGNOSIS — I159 Secondary hypertension, unspecified: Secondary | ICD-10-CM | POA: Diagnosis not present

## 2015-09-22 DIAGNOSIS — F319 Bipolar disorder, unspecified: Secondary | ICD-10-CM | POA: Insufficient documentation

## 2015-09-22 DIAGNOSIS — I10 Essential (primary) hypertension: Secondary | ICD-10-CM | POA: Insufficient documentation

## 2015-09-22 DIAGNOSIS — Z791 Long term (current) use of non-steroidal anti-inflammatories (NSAID): Secondary | ICD-10-CM | POA: Diagnosis not present

## 2015-09-22 DIAGNOSIS — Z87820 Personal history of traumatic brain injury: Secondary | ICD-10-CM | POA: Diagnosis not present

## 2015-09-22 DIAGNOSIS — G8929 Other chronic pain: Secondary | ICD-10-CM | POA: Insufficient documentation

## 2015-09-22 DIAGNOSIS — Z87828 Personal history of other (healed) physical injury and trauma: Secondary | ICD-10-CM | POA: Diagnosis not present

## 2015-09-22 DIAGNOSIS — M25559 Pain in unspecified hip: Secondary | ICD-10-CM

## 2015-09-22 DIAGNOSIS — M25551 Pain in right hip: Secondary | ICD-10-CM | POA: Diagnosis not present

## 2015-09-22 DIAGNOSIS — H9210 Otorrhea, unspecified ear: Secondary | ICD-10-CM | POA: Diagnosis not present

## 2015-09-22 DIAGNOSIS — R52 Pain, unspecified: Secondary | ICD-10-CM

## 2015-09-22 DIAGNOSIS — Z79899 Other long term (current) drug therapy: Secondary | ICD-10-CM | POA: Diagnosis not present

## 2015-09-22 DIAGNOSIS — M25552 Pain in left hip: Secondary | ICD-10-CM | POA: Diagnosis not present

## 2015-09-22 DIAGNOSIS — F17210 Nicotine dependence, cigarettes, uncomplicated: Secondary | ICD-10-CM | POA: Diagnosis not present

## 2015-09-22 LAB — I-STAT CHEM 8, ED
BUN: 23 mg/dL — AB (ref 6–20)
CREATININE: 1.2 mg/dL (ref 0.61–1.24)
Calcium, Ion: 1.1 mmol/L — ABNORMAL LOW (ref 1.12–1.23)
Chloride: 100 mmol/L — ABNORMAL LOW (ref 101–111)
GLUCOSE: 96 mg/dL (ref 65–99)
HEMATOCRIT: 53 % — AB (ref 39.0–52.0)
HEMOGLOBIN: 18 g/dL — AB (ref 13.0–17.0)
POTASSIUM: 4.5 mmol/L (ref 3.5–5.1)
Sodium: 140 mmol/L (ref 135–145)
TCO2: 25 mmol/L (ref 0–100)

## 2015-09-22 LAB — ETHANOL: Alcohol, Ethyl (B): 297 mg/dL — ABNORMAL HIGH (ref <5)

## 2015-09-22 MED ORDER — KETOROLAC TROMETHAMINE 30 MG/ML IJ SOLN
30.0000 mg | Freq: Once | INTRAMUSCULAR | Status: AC
  Administered 2015-09-23: 30 mg via INTRAMUSCULAR
  Filled 2015-09-22: qty 1

## 2015-09-22 MED ORDER — KETOROLAC TROMETHAMINE 30 MG/ML IJ SOLN: 30.0000 mg | Freq: Once | INTRAMUSCULAR | Status: DC

## 2015-09-22 NOTE — ED Notes (Signed)
Per EMS pt has been drinking "all day" and complains of left hip pain.  His vitals were stable.

## 2015-09-22 NOTE — ED Provider Notes (Signed)
CSN: [id number]     Arrival date & time 09/22/15  2052 History   First MD Initiated Contact with Patient 09/22/15 2053     Chief Complaint  Patient presents with  . Hip Pain     (Consider location/radiation/quality/duration/timing/severity/associated sxs/prior Treatment) HPI  Patient is a 54yo male with history of chronic hip pain, bipolar 1 disorder, and HTN presenting with worsening of his left hip pain. Patient is clearly intoxicated on interview, making history difficult to obtain. Patient reports that he has had hip pain since 1978 when he had rods placed in his hips for "degenerative disease." Last year, he fell and damaged his hips bilaterally. He reports that the screws in his leg have now went into his hip joint and that he is due to have bilateral hip replacement, but has been putting it off. He is currently follow by Dr Roda ShuttersXu. Pain is described as sharp and shooting and radiates down his legs bilaterally. Patient only takes diclofenac for the pain, which he says does not help. Patient denies any recent trauma or injury. No weakness or numbness. No fevers or chills.   Past Medical History  Diagnosis Date  . Bipolar 1 disorder (HCC)   . TBI (traumatic brain injury) (HCC)   . Hypertension    Past Surgical History  Procedure Laterality Date  . Hip fracture surgery    . Appendectomy     Family History  Problem Relation Age of Onset  . Asthma Mother   . Heart failure Mother   . Hypertension Father   . Depression Sister   . Alcohol abuse Other    Social History  Substance Use Topics  . Smoking status: Current Every Day Smoker -- 2.00 packs/day    Types: Cigarettes  . Smokeless tobacco: Never Used  . Alcohol Use: Yes     Comment: drinks 3 40 ounce bottles beer    Review of Systems  Constitutional: Negative.   HENT: Positive for ear discharge.   Eyes: Negative.   Respiratory: Negative.   Cardiovascular: Negative.   Gastrointestinal: Negative.   Endocrine: Negative.    Genitourinary: Negative.   Musculoskeletal: Positive for arthralgias.  Skin: Negative.   Allergic/Immunologic: Negative.   Neurological: Negative.   Hematological: Negative.   Psychiatric/Behavioral: Negative.       Allergies  Review of patient's allergies indicates no known allergies.  Home Medications   Prior to Admission medications   Medication Sig Start Date End Date Taking? Authorizing Provider  diclofenac (VOLTAREN) 75 MG EC tablet Take 75 mg by mouth 2 (two) times daily.   Yes Historical Provider, MD  gabapentin (NEURONTIN) 300 MG capsule Take 300 mg by mouth 3 (three) times daily.   Yes Historical Provider, MD  ibuprofen (ADVIL,MOTRIN) 200 MG tablet Take 800 mg by mouth every 6 (six) hours as needed for pain.    Yes Historical Provider, MD  methocarbamol (ROBAXIN) 750 MG tablet Take 750 mg by mouth 2 (two) times daily.   Yes Historical Provider, MD  PARoxetine (PAXIL) 20 MG tablet Take 20 mg by mouth daily.   Yes Historical Provider, MD  QUEtiapine (SEROQUEL) 50 MG tablet Take 50 mg by mouth daily as needed. For sleep per patient   Yes Historical Provider, MD  benzonatate (TESSALON) 100 MG capsule Take 1 capsule (100 mg total) by mouth every 8 (eight) hours. Patient not taking: Reported on 09/22/2015 09/11/13   Fayrene HelperBowie Tran, PA-C  guaiFENesin (ROBITUSSIN) 100 MG/5ML liquid Take 5-10 mLs (100-200 mg total) by  mouth every 4 (four) hours as needed for cough. Patient not taking: Reported on 09/22/2015 09/11/13   Fayrene Helper, PA-C   BP 139/97 mmHg  Pulse 80  Temp(Src) 97.8 F (36.6 C) (Oral)  Resp 20  SpO2 99% Physical Exam  Constitutional: He appears well-developed and well-nourished. No distress.  HENT:  Head: Normocephalic and atraumatic.  Eyes: EOM are normal. Pupils are equal, round, and reactive to light.  Neck: Normal range of motion. Neck supple.  Cardiovascular: Normal rate, regular rhythm and normal heart sounds.   Pulmonary/Chest: Effort normal and breath  sounds normal. No respiratory distress.  Abdominal: Soft. Bowel sounds are normal. He exhibits no distension. There is no tenderness.  Musculoskeletal:       Right hip: He exhibits decreased range of motion and tenderness. He exhibits no swelling, no crepitus and no deformity.       Left hip: He exhibits decreased range of motion and tenderness. He exhibits no swelling, no crepitus and no deformity.  Neurological: He is alert. He has normal strength. No cranial nerve deficit or sensory deficit.  Slurred speech.   Nursing note and vitals reviewed.   ED Course  Procedures (including critical care time) Labs Review Labs Reviewed  ETHANOL - Abnormal; Notable for the following:    Alcohol, Ethyl (B) 297 (*)    All other components within normal limits  I-STAT CHEM 8, ED - Abnormal; Notable for the following:    Chloride 100 (*)    BUN 23 (*)    Calcium, Ion 1.10 (*)    Hemoglobin 18.0 (*)    HCT 53.0 (*)    All other components within normal limits    Imaging Review Dg Pelvis 1-2 Views  09/22/2015  CLINICAL DATA:  Left-sided hip pain for 1 year. EXAM: PELVIS - 1-2 VIEW COMPARISON:  None. FINDINGS: There are for pins in the right hip and 3 pins in the left hip related to remote surgery. 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 There are severe and progressive bilateral hip joint degenerative changes, much worse on the left. 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 The pins have cut through the femoral head and appear to be in the acetabulum or in what is left of the joint. The pubic symphysis and SI joints are intact. No pelvic fractures or bone lesions. Advanced degenerative changes are noted in the lower lumbar spine. 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 The 3 pins have cut through the femoral head and appear to be in the acetabulum. Advanced degenerative changes involving the right hip.  Electronically Signed   By: Rudie Meyer M.D.   On: 09/22/2015 21:53   I have personally reviewed and evaluated these images and lab results as part of my medical decision-making.  MDM   Final diagnoses:  Pain   9:29 PM Patient is a 54yo male with history of chronic hip pain presenting with worsened pain. Of note patient is visibly intoxicated. Will obtain iSTAT chem 8, ethanol level, and hip plain films. No recent trauma or signs of infection.   11:25 PM Plain film shows advanced degenerative changes and confirms the patient's report of 3 pins in his left hip cutting into his acetabulum. Ethanol level noted to be 297. ISTAT chem-8 unremarkable. Will give  toradol for pain relief. Patient currently sleeping. Will discharge home once becomes more sober.   Ardith Dark, MD 09/23/15 1610  Nelva Nay, MD  09/29/15 1122 

## 2015-09-22 NOTE — ED Notes (Signed)
Patient transported to X-ray 

## 2015-09-22 NOTE — Discharge Instructions (Signed)

## 2015-09-23 ENCOUNTER — Encounter (HOSPITAL_COMMUNITY): Payer: Self-pay | Admitting: Family Medicine

## 2015-09-23 ENCOUNTER — Emergency Department (HOSPITAL_COMMUNITY)
Admission: EM | Admit: 2015-09-23 | Discharge: 2015-09-23 | Disposition: A | Discharge status: Home / Self Care | Payer: Medicaid Other | Attending: Emergency Medicine | Admitting: Emergency Medicine

## 2015-09-23 DIAGNOSIS — Z79899 Other long term (current) drug therapy: Secondary | ICD-10-CM | POA: Insufficient documentation

## 2015-09-23 DIAGNOSIS — F1020 Alcohol dependence, uncomplicated: Secondary | ICD-10-CM | POA: Insufficient documentation

## 2015-09-23 DIAGNOSIS — Z791 Long term (current) use of non-steroidal anti-inflammatories (NSAID): Secondary | ICD-10-CM | POA: Insufficient documentation

## 2015-09-23 DIAGNOSIS — F319 Bipolar disorder, unspecified: Secondary | ICD-10-CM | POA: Diagnosis not present

## 2015-09-23 DIAGNOSIS — Z87820 Personal history of traumatic brain injury: Secondary | ICD-10-CM | POA: Insufficient documentation

## 2015-09-23 DIAGNOSIS — Z720 Tobacco use: Secondary | ICD-10-CM | POA: Insufficient documentation

## 2015-09-23 DIAGNOSIS — Z5900 Homelessness unspecified: Secondary | ICD-10-CM

## 2015-09-23 DIAGNOSIS — R109 Unspecified abdominal pain: Secondary | ICD-10-CM | POA: Diagnosis present

## 2015-09-23 DIAGNOSIS — I159 Secondary hypertension, unspecified: Secondary | ICD-10-CM | POA: Insufficient documentation

## 2015-09-23 LAB — COMPREHENSIVE METABOLIC PANEL
ALBUMIN: 4.6 g/dL (ref 3.5–5.0)
ALT: 28 U/L (ref 17–63)
AST: 40 U/L (ref 15–41)
Alkaline Phosphatase: 84 U/L (ref 38–126)
Anion gap: 11 (ref 5–15)
BUN: 21 mg/dL — AB (ref 6–20)
CALCIUM: 9.1 mg/dL (ref 8.9–10.3)
CHLORIDE: 105 mmol/L (ref 101–111)
CO2: 22 mmol/L (ref 22–32)
CREATININE: 0.69 mg/dL (ref 0.61–1.24)
GFR calc Af Amer: >60 mL/min (ref >60)
GLUCOSE: 101 mg/dL — AB (ref 65–99)
POTASSIUM: 5 mmol/L (ref 3.5–5.1)
Sodium: 138 mmol/L (ref 135–145)
TOTAL PROTEIN: 8 g/dL (ref 6.5–8.1)
Total Bilirubin: 0.8 mg/dL (ref 0.3–1.2)

## 2015-09-23 LAB — CBC
HCT: 45.6 % (ref 39.0–52.0)
Hemoglobin: 15.9 g/dL (ref 13.0–17.0)
MCH: 33.1 pg (ref 26.0–34.0)
MCHC: 34.9 g/dL (ref 30.0–36.0)
MCV: 94.8 fL (ref 78.0–100.0)
PLATELETS: 212 10*3/uL (ref 150–400)
RBC: 4.81 MIL/uL (ref 4.22–5.81)
RDW: 13.3 % (ref 11.5–15.5)
WBC: 6.6 10*3/uL (ref 4.0–10.5)

## 2015-09-23 LAB — URINALYSIS, ROUTINE W REFLEX MICROSCOPIC
BILIRUBIN URINE: NEGATIVE
GLUCOSE, UA: NEGATIVE mg/dL
KETONES UR: NEGATIVE mg/dL
Leukocytes, UA: NEGATIVE
Nitrite: NEGATIVE
PROTEIN: 30 mg/dL — AB
Specific Gravity, Urine: 1.023 (ref 1.005–1.030)
Urobilinogen, UA: 0.2 mg/dL (ref 0.0–1.0)
pH: 5 (ref 5.0–8.0)

## 2015-09-23 LAB — URINE MICROSCOPIC-ADD ON

## 2015-09-23 LAB — LIPASE, BLOOD: LIPASE: 22 U/L (ref 11–51)

## 2015-09-23 NOTE — ED Notes (Signed)
Pt here for intermittent RLQ pain that started today and sts he feels like he is "gonna fall out". Seen here last night. Denies pain now and hx of appy.

## 2015-09-23 NOTE — Discharge Instructions (Signed)
Follow-up with a doctor for checkup, next week, and have your blood pressure checked at the [facility].   Alcohol Use Disorder Alcohol use disorder is a mental disorder. It is not a one-time incident of heavy drinking. Alcohol use disorder is the excessive and uncontrollable use of alcohol over time that leads to problems with functioning in one or more areas of daily living. People with this disorder risk harming themselves and others when they drink to excess. Alcohol use disorder also can cause other mental disorders, such as mood and anxiety disorders, and serious physical problems. People with alcohol use disorder often misuse other drugs.  Alcohol use disorder is common and widespread. Some people with this disorder drink alcohol to cope with or escape from negative life events. Others drink to relieve chronic pain or symptoms of mental illness. People with a family history of alcohol use disorder are at higher risk of losing control and using alcohol to excess.  Drinking too much alcohol can cause injury, accidents, and health problems. One drink can be too much when you are:  Working.  Pregnant or breastfeeding.  Taking medicines. Ask your doctor.  Driving or planning to drive. SYMPTOMS  Signs and symptoms of alcohol use disorder may include the following:   Consumption ofalcohol inlarger amounts or over a longer period of time than intended.  Multiple unsuccessful attempts to cutdown or control alcohol use.   A great deal of time spent obtaining alcohol, using alcohol, or recovering from the effects of alcohol (hangover).  A strong desire or urge to use alcohol (cravings).   Continued use of alcohol despite problems at work, school, or home because of alcohol use.   Continued use of alcohol despite problems in relationships because of alcohol use.  Continued use of alcohol in situations when it is physically hazardous, such as driving a car.  Continued use of alcohol despite  awareness of a physical or psychological problem that is likely related to alcohol use. Physical problems related to alcohol use can involve the brain, heart, liver, stomach, and intestines. Psychological problems related to alcohol use include intoxication, depression, anxiety, psychosis, delirium, and dementia.   The need for increased amounts of alcohol to achieve the same desired effect, or a decreased effect from the consumption of the same amount of alcohol (tolerance).  Withdrawal symptoms upon reducing or stopping alcohol use, or alcohol use to reduce or avoid withdrawal symptoms. Withdrawal symptoms include:  Racing heart.  Hand tremor.  Difficulty sleeping.  Nausea.  Vomiting.  Hallucinations.  Restlessness.  Seizures. DIAGNOSIS Alcohol use disorder is diagnosed through an assessment by your health care provider. Your health care provider may start by asking three or four questions to screen for excessive or problematic alcohol use. To confirm a diagnosis of alcohol use disorder, at least two symptoms must be present within a 74-month period. The severity of alcohol use disorder depends on the number of symptoms:  Mild--two or three.  Moderate--four or five.  Severe--six or more. Your health care provider may perform a physical exam or use results from lab tests to see if you have physical problems resulting from alcohol use. Your health care provider may refer you to a mental health professional for evaluation. TREATMENT  Some people with alcohol use disorder are able to reduce their alcohol use to low-risk levels. Some people with alcohol use disorder need to quit drinking alcohol. When necessary, mental health professionals with specialized training in substance use treatment can help. Your health care  provider can help you decide how severe your alcohol use disorder is and what type of treatment you need. The following forms of treatment are available:    Detoxification. Detoxification involves the use of prescription medicines to prevent alcohol withdrawal symptoms in the first week after quitting. This is important for people with a history of symptoms of withdrawal and for heavy drinkers who are likely to have withdrawal symptoms. Alcohol withdrawal can be dangerous and, in severe cases, cause death. Detoxification is usually provided in a hospital or in-patient substance use treatment facility.  Counseling or talk therapy. Talk therapy is provided by substance use treatment counselors. It addresses the reasons people use alcohol and ways to keep them from drinking again. The goals of talk therapy are to help people with alcohol use disorder find healthy activities and ways to cope with life stress, to identify and avoid triggers for alcohol use, and to handle cravings, which can cause relapse.  Medicines.Different medicines can help treat alcohol use disorder through the following actions:  Decrease alcohol cravings.  Decrease the positive reward response felt from alcohol use.  Produce an uncomfortable physical reaction when alcohol is used (aversion therapy).  Support groups. Support groups are run by people who have quit drinking. They provide emotional support, advice, and guidance. These forms of treatment are often combined. Some people with alcohol use disorder benefit from intensive combination treatment provided by specialized substance use treatment centers. Both inpatient and outpatient treatment programs are available.   This information is not intended to replace advice given to you by your health care provider. Make sure you discuss any questions you have with your health care provider.   Document Released: 12/06/2004 Document Revised: 11/19/2014 Document Reviewed: 02/05/2013 Elsevier Interactive Patient Education 2016 [employer].  Hypertension Hypertension is another name for high blood pressure. High blood pressure  forces your heart to work harder to pump blood. A blood pressure reading has two numbers, which includes a higher number over a lower number (example: 110/72). HOME CARE   Have your blood pressure rechecked by your doctor.  Only take medicine as told by your doctor. Follow the directions carefully. The medicine does not work as well if you skip doses. Skipping doses also puts you at risk for problems.  Do not smoke.  Monitor your blood pressure at home as told by your doctor. GET HELP IF:  You think you are having a reaction to the medicine you are taking.  You have repeat headaches or feel dizzy.  You have puffiness (swelling) in your ankles.  You have trouble with your vision. GET HELP RIGHT AWAY IF:   You get a very bad headache and are confused.  You feel weak, numb, or faint.  You get chest or belly (abdominal) pain.  You throw up (vomit).  You cannot breathe very well. MAKE SURE YOU:   Understand these instructions.  Will watch your condition.  Will get help right away if you are not doing well or get worse.   This information is not intended to replace advice given to you by your health care provider. Make sure you discuss any questions you have with your health care provider.   Document Released: 04/16/2008 Document Revised: 11/03/2013 Document Reviewed: 08/21/2013 Elsevier Interactive Patient Education 2016 [employer].    Emergency Department Resource Guide 1) Find a Doctor and Pay Out of Pocket Although you won't have to find out who is covered by your insurance plan, it is a good idea  to ask around and get recommendations. You will then need to call the office and see if the doctor you have chosen will accept you as a new patient and what types of options they offer for patients who are self-pay. Some doctors offer discounts or will set up payment plans for their patients who do not have insurance, but you will need to ask so you aren't surprised when you  get to your appointment.  2) Contact Your Local Health Department Not all health departments have doctors that can see patients for sick visits, but many do, so it is worth a call to see if yours does. If you don't know where your local health department is, you can check in your phone book. The CDC also has a tool to help you locate your state's health department, and many state websites also have listings of all of their local health departments.  3) Find a Walk-in Clinic If your illness is not likely to be very severe or complicated, you may want to try a walk in clinic. These are popping up all over the country in pharmacies, drugstores, and shopping centers. They're usually staffed by nurse practitioners or physician assistants that have been trained to treat common illnesses and complaints. They're usually fairly quick and inexpensive. However, if you have serious medical issues or chronic medical problems, these are probably not your best option.  No Primary Care Doctor: - Call Health Connect at  [phone] - they can help you locate a primary care doctor that  accepts your insurance, provides certain services, etc. - Physician Referral Service- [phone]  Chronic Pain Problems: Organization         Address  Phone   Notes  [facility]  [phone] Patients need to be referred by their primary care doctor.   Medication Assistance: Organization         Address  Phone   Notes  [facility] [address] [place] [place], [place] [address] [phone] --Must be a resident of [facility] -- Must have NO insurance coverage whatsoever (no Medicaid/ Medicare, etc.) -- The pt. MUST have a primary care doctor that directs their care regularly and follows them in the community   [facility]  [phone]   [employer]  [phone]    Agencies that provide inexpensive medical care: Organization         Address  Phone    Notes  [facility]  [phone]   [facility]    [phone]   [facility] [address] [place], [place] [address] [phone]   [facility] [address] [place] [place] [phone]   Planned Parenthood    [phone]   [facility]    [phone]   [facility]  [address], [place] Phone:  [phone], Fax:  [phone] Hours of Operation:  9 am - 6 pm, M-F.  Also accepts Medicaid/Medicare and self-pay.  [employer] [facility]  [address], [place] Phone: [phone], Fax: [phone]. Hours of Operation:  8:30 am - 5:30 pm, M-F.  Also accepts Medicaid and self-pay.  [facility] [place] [address] [place] Phone: [phone]   [facility] [address] [place], [place] [phone], Ext. [phone] Mondays & Thursdays: 7-9 AM.  First 15 patients  are seen on a first come, first serve basis.    Medicaid-accepting [facility] Providers:  Organization         Address  Phone   Notes  [facility] [address], [employer] [phone] Also accepts self-pay patients.  [facility] [address], [place]  [phone]) [phone]   [facility] [address], [place] [phone]   [facility] [address] [place] [address], [place] [phone]   Renaye RakersVeita Bland [address], [place]   [phone]) [phone] Only accepts [place] Access [place] patients after they have their name applied to their card.   Self-Pay (no insurance) in [place] [facility]:  Organization         Address  Phone   Notes  Sickle Cell Patients, [facility] [address], [place] [phone]   [facility] [address], [place] [phone]) [phone]   [facility] [place]  [address], [place] [phone]   [facility]/Dr. Osei-Bonsu  [address] [place] [address], [place] or [phone], [place] [phone] Phone number for both [place] and [place] locations is the same.  [facility] [address], [place] [phone]) [phone]   [facility] [place] [address] [place] [address], [place] or [address] [phone]   [facility] [address], [place] [phone]) [phone], phone; [phone]) [phone], fax Sees patients 1st and 3rd Saturday of every month.  Must not qualify for public or private insurance (i.e. Medicaid, Medicare, [place] Health Choice, Veterans' Benefits)  Household income should be no more than 200% of the poverty level The clinic cannot treat you if you are pregnant or think you are pregnant  Sexually transmitted diseases are not treated at the clinic.    Dental Care: Organization         Address  Phone  Notes  [facility] [address], [place] [phone] Accepts children up to age 56 who are enrolled in [place] or [place] Health Choice; pregnant women with a Medicaid card; and children who have applied for Medicaid or [place] Health Choice, but were declined, whose parents can pay a reduced fee at time of service.  [facility] [place]  [address], [place] [phone] Accepts children up to age 56 who are enrolled in [place] or [place] Health Choice; pregnant women with a Medicaid card; and children who have applied for Medicaid or [place] Health Choice, but were declined, whose parents can pay a reduced fee at time of service.  [facility]  [address], [place] [phone] Patients are seen by appointment only. Walk-ins are not accepted. [facility] will see patients 56 years of age  and older. Monday - Tuesday (8am-5pm) Most Wednesdays (8:30-5pm) $30 per visit, cash only  [facility]  [address], [facility] [place] [phone] Patients are seen by appointment only. Walk-ins are not accepted. [facility] will see patients 56 years of age and older. One Wednesday Evening (Monthly: Volunteer Based).  $30 per visit, cash only  [facility]  [phone] for adults; Children under age 844, call Graduate Pediatric Dentistry at [phone]. Children aged 4-14, please call [phone] to request a pediatric application. ° Dental services are provided in all areas of dental care including fillings, crowns and bridges, complete and partial dentures, implants, gum treatment, root canals, and extractions. Preventive care is also provided. Treatment is provided to both adults and children. °Patients are selected via a lottery and there is often a waiting list. °  °[facility] [address], °[place] ° [phone] [email or web address] °  °[facility] [address], [place], [place] [phone], Ext. [phone] Second and Fourth Thursday of each month, opens at 6:30 AM; Clinic ends at 9 AM.  Patients are seen on a first-come first-served basis, and a limited number are seen during each clinic.  ° °[facility] ° [address], [place], [place] [phone]   Eligibility Requirements °You must have lived in [place], [place], or [place] counties for at least the last three months. °  You cannot be eligible for state or federal sponsored healthcare insurance, including Veterans Administration, Medicaid, or Medicare. °  You generally cannot be eligible for healthcare insurance through your employer.  °  How to apply: °Eligibility screenings are held every Tuesday and Wednesday afternoon from 1:00 pm until 4:00 pm. You do not need an appointment for the interview!  °[facility] [address], [place], [place] [phone]   °[facility]  [phone]   °[place]  [phone]   °[facility]  336-570-6415   ° °Behavioral Health Resources in the Community: °Intensive Outpatient Programs °Organization         Address  Phone  Notes  °[place] [facility] [address], [place], [place] [phone]   °[facility] [address], [place], [place] [phone]   °[facility] [address], [place], [place] ° [phone]   °[facility] [address],  °[place], [place] 1-800-853-5163 or 336-641-4981   °Substance Abuse Resources °Organization         Address  Phone  Notes  °[facility]  336-882-2125   °[facility]  336-784-9470   °[facility]  336-285-9073   °[facility]  336-845-3988   °Residential & Outpatient Substance Abuse Program  1-800-659-3381   °Psychological Services °Organization         Address  Phone  Notes  ° Health  336- [phone]   °[facility]  336- 378-7881   °[facility] [address], [place] 1-800-853-5163 or 336-641-4981   ° °Mobile Crisis Teams °Organization         Address  Phone  Notes  °[facility], Mobile Crisis Care Unit  1-877-626-1772   °Assertive °[facility]otherapeutic Services ° [address] [place], [place] 336-834-9664   °Sharon DeEsch [address] °[place] 336-554-5454   ° °Self-Help/Support Groups °Organization         Address  Phone             Notes  °[facility] [place] - variety of support groups  336- 373-1402 Call for more information  °Narcotics Anonymous (NA), [facility] [address], °[place]  2 meetings at this location  ° °Residential Treatment Programs °Organization         Address  Phone  Notes  °[facility] [address],    °[place]   1-866-801-8205   °  415-150-8752   [facility]  [address] [place] [address], [place], [place] [phone]   [facility] [address] [place], [place] [phone] Admissions: 8am-3pm M-F  [facility] [address]    [place], [place] [phone]   [facility] [address] [place], [place], [place] [phone]   [facility] [address]  [place], [place] [phone]   [facility] - Intensive Outpatient [address], [place], [place] [phone]   [facility] [address]  [place], [place] 3-295-188-4166 or [phone]   [facility] ([facility]) [address], [place], [place] [phone] Accepts Medicaid  [facility] [address]  [place] 2-542-706-2376 Substance Abuse/Addiction Treatment   [facility] Organization         Address  Phone  Notes  [facility]  [phone]   Angie Fava, PhD [address] [place], [place]   616-442-3345 or [phone]   [facility]   [address] [place], [place] [phone]   [facility] [address], [place], [place] [phone] Insurance/Medicaid/sponsorship through [facility] [address]                                    [place], [place] [phone] Therapy/tele-psych/case  [facility] [place] [address], [place] [phone]    Dr. Lolly Mustache  [phone]   [facility]  [employer] [facility] Dept. 1) [address] [place] 2) [address], [place] 3)  [address], [place] [phone]  [phone]   [facility] Child Abuse Hotline (308) 544-8061 or (859)865-0395 (After Hours)

## 2015-09-23 NOTE — ED Notes (Signed)
Pt given sandwich and soda. Tolerating well.  

## 2015-09-23 NOTE — ED Provider Notes (Signed)
CSN: [id number]     Arrival date & time 09/23/15  9528 History   First MD Initiated Contact with Patient 09/23/15 1139     Chief Complaint  Patient presents with  . Abdominal Pain     (Consider location/radiation/quality/duration/timing/severity/associated sxs/prior Treatment) HPI    Johnny Banks is a 54 y.o. male who presents for evaluation of lower abdominal pain. He states, "I feel like I'm going to fall out." He also states that he is homeless. He came to the ED about 9 PM last night, and has never left. He has been sitting in the waiting room and decided to check back in. He admits to drinking problem. He was evaluated and treated for his hip pain last night. There are no other known modifying factors.  Past Medical History  Diagnosis Date  . Bipolar 1 disorder (HCC)   . TBI (traumatic brain injury) (HCC)   . Hypertension    Past Surgical History  Procedure Laterality Date  . Hip fracture surgery    . Appendectomy     Family History  Problem Relation Age of Onset  . Asthma Mother   . Heart failure Mother   . Hypertension Father   . Depression Sister   . Alcohol abuse Other    Social History  Substance Use Topics  . Smoking status: Current Every Day Smoker -- 2.00 packs/day    Types: Cigarettes  . Smokeless tobacco: Never Used  . Alcohol Use: Yes     Comment: drinks 3 40 ounce bottles beer    Review of Systems  All other systems reviewed and are negative.     Allergies  Review of patient's allergies indicates no known allergies.  Home Medications   Prior to Admission medications   Medication Sig Start Date End Date Taking? Authorizing Provider  diclofenac (VOLTAREN) 75 MG EC tablet Take 75 mg by mouth 2 (two) times daily.   Yes Historical Provider, MD  gabapentin (NEURONTIN) 300 MG capsule Take 300 mg by mouth 3 (three) times daily.   Yes Historical Provider, MD  ibuprofen (ADVIL,MOTRIN) 200 MG tablet Take 800 mg by mouth every 6 (six) hours as needed  for pain.    Yes Historical Provider, MD  methocarbamol (ROBAXIN) 750 MG tablet Take 750 mg by mouth 2 (two) times daily.   Yes Historical Provider, MD  PARoxetine (PAXIL) 20 MG tablet Take 20 mg by mouth daily.   Yes Historical Provider, MD  QUEtiapine (SEROQUEL) 50 MG tablet Take 50 mg by mouth daily as needed. For sleep per patient   Yes Historical Provider, MD   BP 162/119 mmHg  Pulse 107  Temp(Src) 98.2 F (36.8 C) (Oral)  Resp 18  SpO2 97% Physical Exam  Constitutional: He is oriented to person, place, and time. He appears well-developed.  Appears older than stated age. He is disheveled.  HENT:  Head: Normocephalic and atraumatic.  Right Ear: External ear normal.  Left Ear: External ear normal.  Eyes: Conjunctivae and EOM are normal. Pupils are equal, round, and reactive to light.  Neck: Normal range of motion and phonation normal. Neck supple.  Cardiovascular: Normal rate.   Pulmonary/Chest: Effort normal. He exhibits no bony tenderness.  Abdominal: He exhibits no distension.  Musculoskeletal: Normal range of motion.  Neurological: He is alert and oriented to person, place, and time. No cranial nerve deficit or sensory deficit. He exhibits normal muscle tone. Coordination normal.  Skin: Skin is warm, dry and intact.  Psychiatric: He has a  normal mood and affect. His behavior is normal. Judgment and thought content normal.  Nursing note and vitals reviewed.   ED Course  Procedures (including critical care time)  Medications - No data to display  Patient Vitals for the past 24 hrs:  BP Temp Temp src Pulse Resp SpO2  09/23/15 1400 (!) 162/119 mmHg 98.2 F (36.8 C) Oral 107 18 97 %  09/23/15 1330 (!) 170/122 mmHg - - 103 18 97 %  09/23/15 1230 (!) 156/106 mmHg - - 100 - 96 %  09/23/15 1200 (!) 168/105 mmHg - - 94 - 96 %  09/23/15 1130 (!) 157/112 mmHg - - 91 - 95 %  09/23/15 1057 (!) 176/118 mmHg - - 100 18 99 %  09/23/15 0930 146/99 mmHg 98 F (36.7 C) Oral 110 18 98  %   12:25- consult social work, for assistance with housing  2:34 PM Reevaluation with update and discussion. After initial assessment and treatment, an updated evaluation reveals no change in clinical status. He continues to be hypertensive. Social work has arranged for [facility]. Kamill Fulbright L    Labs Review Labs Reviewed  COMPREHENSIVE METABOLIC PANEL - Abnormal; Notable for the following:    Glucose, Bld 101 (*)    BUN 21 (*)    All other components within normal limits  URINALYSIS, ROUTINE W REFLEX MICROSCOPIC (NOT AT [facility]) - Abnormal; Notable for the following:    Hgb urine dipstick SMALL (*)    Protein, ur 30 (*)    All other components within normal limits  URINE MICROSCOPIC-ADD ON - Abnormal; Notable for the following:    Casts HYALINE CASTS (*)    All other components within normal limits  LIPASE, BLOOD  CBC    Imaging Review Dg Pelvis 1-2 Views  09/22/2015  CLINICAL DATA:  Left-sided hip pain for 1 year. EXAM: PELVIS - 1-2 VIEW COMPARISON:  None. FINDINGS: There are for pins in the right hip and 3 pins in the left hip related to remote surgery. 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 There are severe and progressive bilateral hip joint degenerative changes, much worse on the left. 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 The pins have cut through the femoral head and appear to be in the acetabulum or in what is left of the joint. The pubic symphysis and SI joints are intact. No pelvic fractures or bone lesions. Advanced degenerative changes are noted in the lower lumbar spine. 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 The 3 pins have cut through the femoral head and appear to be in the acetabulum. Advanced degenerative changes involving the right hip. Electronically Signed   By: Rudie MeyerP.  Gallerani M.D.   On: 09/22/2015 21:53   I have personally reviewed and  evaluated these images and lab results as part of my medical decision-making.   EKG Interpretation None      MDM   Final diagnoses:  Homelessness  Secondary hypertension, unspecified  Alcoholism (HCC)    Homelessness, with alcoholism. Mild hypertension. Doubt hypertensive urgency, metabolic instability or serious bacterial infection.    Nursing Notes Reviewed/ Care Coordinated Applicable Imaging Reviewed Interpretation of Laboratory Data incorporated into ED treatment  The patient appears reasonably screened and/or stabilized for discharge and I doubt any other medical condition or other [facility] requiring further screening, evaluation, or treatment in the ED at this time prior to discharge.  Plan: Home Medications- usual;  Home Treatments- stop EtOH; return here if the recommended treatment, does not improve the symptoms; Recommended follow up- PCP prn. [facility] to the [facility] now    Mancel Bale, MD 09/23/15 1435

## 2016-08-02 ENCOUNTER — Emergency Department (HOSPITAL_COMMUNITY)
Admission: EM | Admit: 2016-08-02 | Discharge: 2016-08-03 | Disposition: A | Discharge status: Home / Self Care | Payer: Medicaid Other | Attending: Emergency Medicine | Admitting: Emergency Medicine

## 2016-08-02 ENCOUNTER — Encounter (HOSPITAL_COMMUNITY): Payer: Self-pay | Admitting: Emergency Medicine

## 2016-08-02 ENCOUNTER — Emergency Department (HOSPITAL_COMMUNITY): Payer: Medicaid Other

## 2016-08-02 DIAGNOSIS — S0101XA Laceration without foreign body of scalp, initial encounter: Secondary | ICD-10-CM | POA: Diagnosis present

## 2016-08-02 DIAGNOSIS — T148XXA Other injury of unspecified body region, initial encounter: Secondary | ICD-10-CM

## 2016-08-02 DIAGNOSIS — F17210 Nicotine dependence, cigarettes, uncomplicated: Secondary | ICD-10-CM | POA: Insufficient documentation

## 2016-08-02 DIAGNOSIS — I10 Essential (primary) hypertension: Secondary | ICD-10-CM | POA: Diagnosis not present

## 2016-08-02 DIAGNOSIS — S0990XA Unspecified injury of head, initial encounter: Secondary | ICD-10-CM | POA: Insufficient documentation

## 2016-08-02 DIAGNOSIS — Y9289 Other specified places as the place of occurrence of the external cause: Secondary | ICD-10-CM | POA: Diagnosis not present

## 2016-08-02 DIAGNOSIS — Y939 Activity, unspecified: Secondary | ICD-10-CM | POA: Insufficient documentation

## 2016-08-02 DIAGNOSIS — Y999 Unspecified external cause status: Secondary | ICD-10-CM | POA: Insufficient documentation

## 2016-08-02 DIAGNOSIS — W1830XA Fall on same level, unspecified, initial encounter: Secondary | ICD-10-CM | POA: Diagnosis not present

## 2016-08-02 DIAGNOSIS — W19XXXA Unspecified fall, initial encounter: Secondary | ICD-10-CM

## 2016-08-02 MED ORDER — TETANUS-DIPHTH-ACELL PERTUSSIS 5-2.5-18.5 LF-MCG/0.5 IM SUSP
0.5000 mL | Freq: Once | INTRAMUSCULAR | Status: AC
  Administered 2016-08-02: 0.5 mL via INTRAMUSCULAR
  Filled 2016-08-02: qty 0.5

## 2016-08-02 NOTE — ED Triage Notes (Addendum)
Pt to ED via [facility] after reported falling backwards and hitting his head on the pavement.  Pt admits to (3) 42oz beers today and was walking on crutches due to a left hip fracture. Pt denies LOC.  EMS report hematoma with abrasion to back of head. On arrival to ED pt on long spine board with head blocks and c-collar in place

## 2016-08-02 NOTE — ED Notes (Signed)
C-Collar removed by Dr. Rhunette CroftNanavati  Wound on head cleaned and prepped for closure.

## 2016-08-02 NOTE — ED Notes (Signed)
Pt logged rolled and lone spine board removed per Dr. Rhunette CroftNanavati

## 2016-08-02 NOTE — ED Provider Notes (Signed)
[facility]-EMERGENCY DEPT Provider Note   CSN: [id number] Arrival date & time: 08/02/16  1520     History   Chief Complaint Chief Complaint  Patient presents with  . Fall    HPI Johnny Banks is a 55 y.o. male.  HPI Pt comes in post fall. Pt was intoxicated and walking with crutches and fell. Pt reports that he has a headache, otherwise has no complains. He is admitting to drinking beers. Pt is not on blood thinners. He has no numbness, tingling.  Past Medical History:  Diagnosis Date  . Bipolar 1 disorder (HCC)   . Hypertension   . TBI (traumatic brain injury) [facility])     Patient Active Problem List   Diagnosis Date Noted  . Hypotension, unspecified 04/04/2013  . Sepsis (HCC) 04/02/2013  . Bipolar disorder (HCC) 04/02/2013  . Polysubstance abuse 04/02/2013  . HTN (hypertension) 04/02/2013  . Syncope 04/02/2013  . Acute kidney injury (HCC) 04/02/2013    Past Surgical History:  Procedure Laterality Date  . APPENDECTOMY    . HIP FRACTURE SURGERY         Home Medications    Prior to Admission medications   Medication Sig Start Date End Date Taking? Authorizing Provider  diclofenac (VOLTAREN) 75 MG EC tablet Take 75 mg by mouth 2 (two) times daily.   Yes Historical Provider, MD  gabapentin (NEURONTIN) 300 MG capsule Take 600 mg by mouth 2 (two) times daily.    Yes Historical Provider, MD  methocarbamol (ROBAXIN) 750 MG tablet Take 750 mg by mouth 2 (two) times daily.   Yes Historical Provider, MD  PARoxetine (PAXIL) 20 MG tablet Take 20 mg by mouth daily.   Yes Historical Provider, MD  QUEtiapine (SEROQUEL) 50 MG tablet Take 50 mg by mouth daily as needed (for sleep).    Yes Historical Provider, MD    Family History Family History  Problem Relation Age of Onset  . Asthma Mother   . Heart failure Mother   . Hypertension Father   . Depression Sister   . Alcohol abuse Other     Social History Social History  Substance Use Topics  . Smoking status: Current  Every Day Smoker    Packs/day: 2.00    Types: Cigarettes  . Smokeless tobacco: Never Used  . Alcohol use Yes     Comment: drinks 3 40 ounce bottles beer     Allergies   Review of patient's allergies indicates no known allergies.   Review of Systems Review of Systems  ROS 10 Systems reviewed and are negative for acute change except as noted in the HPI.     Physical Exam Updated Vital Signs BP 155/98 (BP Location: Right Arm)   Pulse 76   Temp 98.2 F (36.8 C) (Oral)   Resp 18   Ht 6\' 1"  (1.854 m)   Wt 210 lb (95.3 kg)   SpO2 98%   BMI 27.71 kg/m   Physical Exam  Constitutional: He is oriented to person, place, and time. He appears well-developed.  HENT:  Head: Atraumatic.  Neck: Neck supple.  Cardiovascular: Normal rate.   Pulmonary/Chest: Effort normal.  Musculoskeletal:  Pt has scalp bleeding - posteriorly.  OTHERWISE: Head to toe evaluation shows no hematoma, bleeding of the scalp, no facial abrasions, step offs, crepitus, no tenderness to palpation of the bilateral upper and lower extremities, no gross deformities, no chest tenderness, no pelvic pain.   Neurological: He is alert and oriented to person, place, and time.  Skin: Skin is warm.  Nursing note and vitals reviewed.    ED Treatments / Results  Labs (all labs ordered are listed, but only abnormal results are displayed) Labs Reviewed  ETHANOL - Abnormal; Notable for the following:       Result Value   Alcohol, Ethyl (B) 93 (*)    All other components within normal limits    EKG  EKG Interpretation None       Radiology Ct Head Wo Contrast  Result Date: 08/02/2016 CLINICAL DATA:  Patient fell backwards and hit head on pavement. EXAM: CT HEAD WITHOUT CONTRAST CT CERVICAL SPINE WITHOUT CONTRAST TECHNIQUE: Multidetector CT imaging of the head and cervical spine was performed following the standard protocol without intravenous contrast. Multiplanar CT image reconstructions of the cervical  spine were also generated. COMPARISON:  01/23/2012 FINDINGS: CT HEAD FINDINGS Brain: There is no evidence for acute hemorrhage, hydrocephalus, mass lesion, or abnormal extra-axial fluid collection. No definite CT evidence for acute infarction. Diffuse loss of parenchymal volume is consistent with atrophy. Patchy low attenuation in the deep hemispheric and periventricular white matter is nonspecific, but likely reflects chronic microvascular ischemic demyelination. Vascular: No hyperdense vessel or unexpected calcification. Skull: No evidence for skull fracture. Sinuses/Orbits: Chronic opacification left mastoid air cells, seen previously. Right mastoid air cells are aerated. There is some mucosal thickening in scattered ethmoid air cells. Frontal sinuses, sphenoid sinuses, and maxillary sinuses are clear. Other: Right parietal scalp contusion is evident. CT CERVICAL SPINE FINDINGS Alignment: Imaging was obtained from the skullbase through the T2-3 interspace. Alignment is anatomic. Skull base and vertebrae: No evidence of fracture. Soft tissues and spinal canal: Posterior spurring generates multilevel central canal narrowing. Disc levels: Loss of disc height seen at C4-5, C5-6, C6-7, and C7-T1. Upper chest: Unremarkable. Other: No prevertebral soft tissue swelling. IMPRESSION: 1. No acute intracranial abnormality. 2. Degenerative changes in the midcervical spine without fracture. 3. Chronic opacification left mastoid air cells and middle ear. 4. Right parietal scalp contusion. Electronically Signed   By: Kennith Center M.D.   On: 08/02/2016 17:15   Ct Cervical Spine Wo Contrast  Result Date: 08/02/2016 CLINICAL DATA:  Patient fell backwards and hit head on pavement. EXAM: CT HEAD WITHOUT CONTRAST CT CERVICAL SPINE WITHOUT CONTRAST TECHNIQUE: Multidetector CT imaging of the head and cervical spine was performed following the standard protocol without intravenous contrast. Multiplanar CT image reconstructions of  the cervical spine were also generated. COMPARISON:  01/23/2012 FINDINGS: CT HEAD FINDINGS Brain: There is no evidence for acute hemorrhage, hydrocephalus, mass lesion, or abnormal extra-axial fluid collection. No definite CT evidence for acute infarction. Diffuse loss of parenchymal volume is consistent with atrophy. Patchy low attenuation in the deep hemispheric and periventricular white matter is nonspecific, but likely reflects chronic microvascular ischemic demyelination. Vascular: No hyperdense vessel or unexpected calcification. Skull: No evidence for skull fracture. Sinuses/Orbits: Chronic opacification left mastoid air cells, seen previously. Right mastoid air cells are aerated. There is some mucosal thickening in scattered ethmoid air cells. Frontal sinuses, sphenoid sinuses, and maxillary sinuses are clear. Other: Right parietal scalp contusion is evident. CT CERVICAL SPINE FINDINGS Alignment: Imaging was obtained from the skullbase through the T2-3 interspace. Alignment is anatomic. Skull base and vertebrae: No evidence of fracture. Soft tissues and spinal canal: Posterior spurring generates multilevel central canal narrowing. Disc levels: Loss of disc height seen at C4-5, C5-6, C6-7, and C7-T1. Upper chest: Unremarkable. Other: No prevertebral soft tissue swelling. IMPRESSION: 1. No acute intracranial abnormality. 2. Degenerative  changes in the midcervical spine without fracture. 3. Chronic opacification left mastoid air cells and middle ear. 4. Right parietal scalp contusion. Electronically Signed   By: Kennith CenterEric  Mansell M.D.   On: 08/02/2016 17:15    Procedures .Marland Kitchen.Laceration Repair Date/Time: 08/03/2016 3:05 AM Performed by: Derwood KaplanNANAVATI, Hashim Eichhorst Authorized by: Derwood KaplanNANAVATI, Nelline Lio   Consent:    Consent obtained:  Verbal   Consent given by:  Patient   Risks discussed:  Pain, need for additional repair, poor cosmetic result and poor wound healing   Alternatives discussed:  No treatment Anesthesia (see MAR  for exact dosages):    Anesthesia method:  None Laceration details:    Location:  Scalp   Scalp location:  Crown   Length (cm):  3 Repair type:    Repair type:  Simple Exploration:    Contaminated: no   Treatment:    Area cleansed with:  Saline   Amount of cleaning:  Standard   Irrigation solution:  Sterile water   Irrigation method:  Pressure wash   Visualized foreign bodies/material removed: no   Skin repair:    Repair method:  Staples   Number of staples:  3 Approximation:    Approximation:  Close   Vermilion border: well-aligned   Post-procedure details:    Dressing:  Open (no dressing)   Patient tolerance of procedure:  Tolerated well, no immediate complications   (including critical care time)  Medications Ordered in ED Medications  Tdap (BOOSTRIX) injection 0.5 mL (0.5 mLs Intramuscular Given 08/02/16 1941)     Initial Impression / Assessment and Plan / ED Course  I have reviewed the triage vital signs and the nursing notes.  Pertinent labs & imaging results that were available during my care of the patient were reviewed by me and considered in my medical decision making (see chart for details).  Clinical Course    DDx includes: - Mechanical falls - ICH - Fractures - Contusions - Soft tissue injury  Ct HEAD AND cSPINE ARE NEG. Lac repaired. D/c when pt is legally sober.  Final Clinical Impressions(s) / ED Diagnoses   Final diagnoses:  Scalp laceration, initial encounter  Fall, initial encounter  Contusion    New Prescriptions New Prescriptions   No medications on file     Derwood KaplanAnkit Maiko Salais, MD 08/03/16 [phone]

## 2016-08-03 LAB — ETHANOL: ALCOHOL ETHYL (B): 93 mg/dL — AB (ref <5)

## 2016-08-03 NOTE — Discharge Instructions (Signed)
We saw you in the ER after you had a fall. All the imaging results are normal, no fractures seen. No evidence of brain bleed. Please be very careful with walking, and do everything possible to prevent falls.  STAPLE REMOVAL IN 7-10 DAYS.

## 2016-10-03 IMAGING — CT CT CERVICAL SPINE W/O CM
3 of 4 series · 13 of 33 positions shown, 16 images · non-contrast
Comparison: 01/23/2012

CLINICAL DATA: Patient fell backwards and hit head on pavement.

EXAM:
CT HEAD WITHOUT CONTRAST
CT CERVICAL SPINE WITHOUT CONTRAST
TECHNIQUE: Multidetector CT imaging of the head and cervical spine was
performed following the standard protocol without intravenous
contrast. Multiplanar CT image reconstructions of the cervical spine
were also generated.

[Series 3: c_spine 2.0 st · axial · 0.44mm/px · z∈[-478,-330]mm · 5 of 112 slices shown, 7 images]
[im 19/112  soft-tissue]
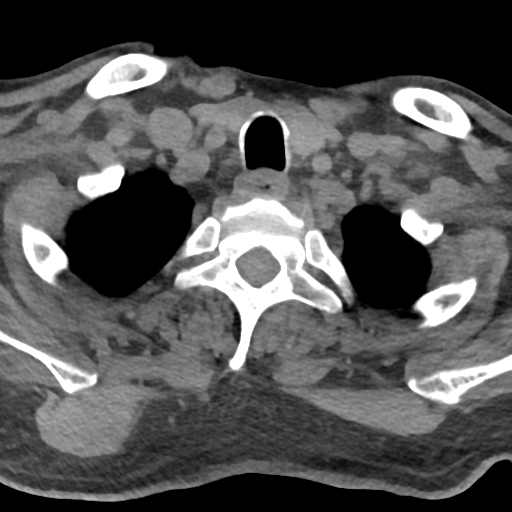
[im 19/112  bone]
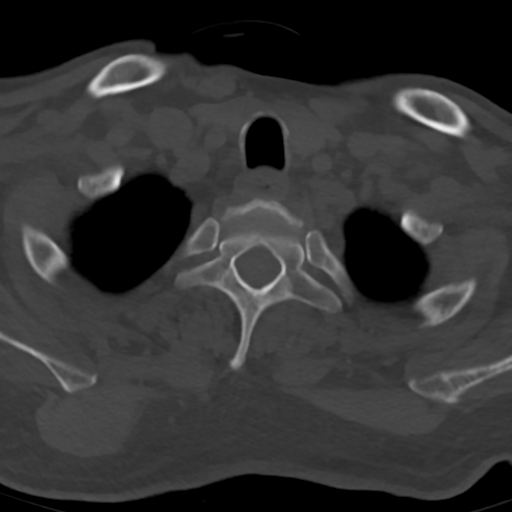
[im 38/112  bone]
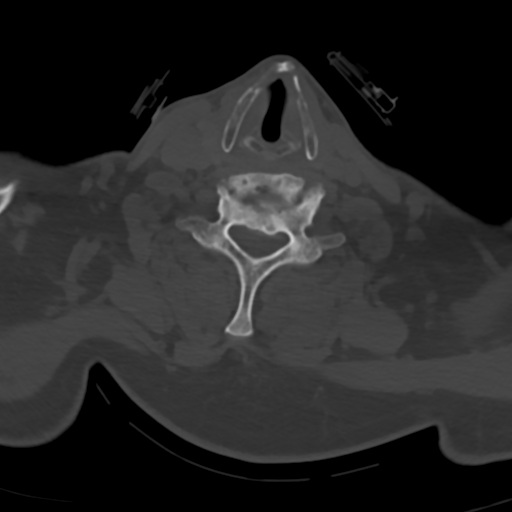
[im 56/112  bone]
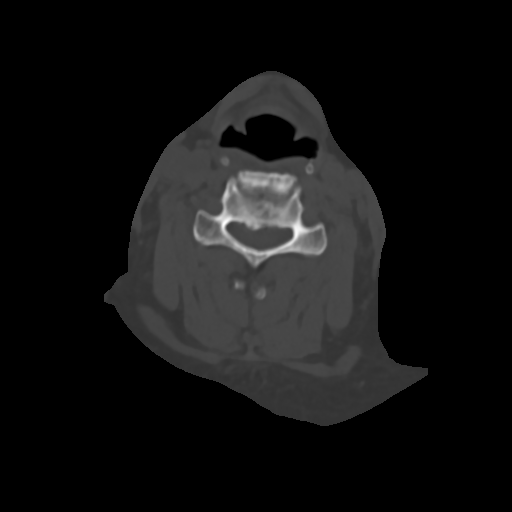
[im 75/112  bone]
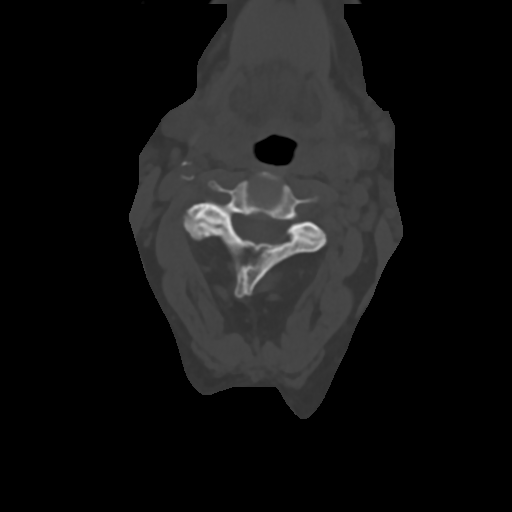
[im 93/112  soft-tissue]
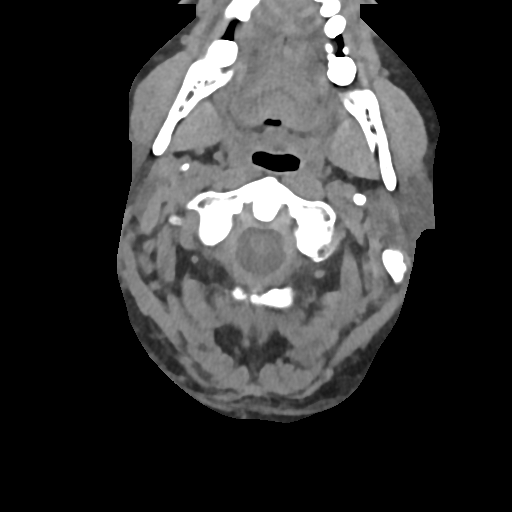
[im 93/112  bone]
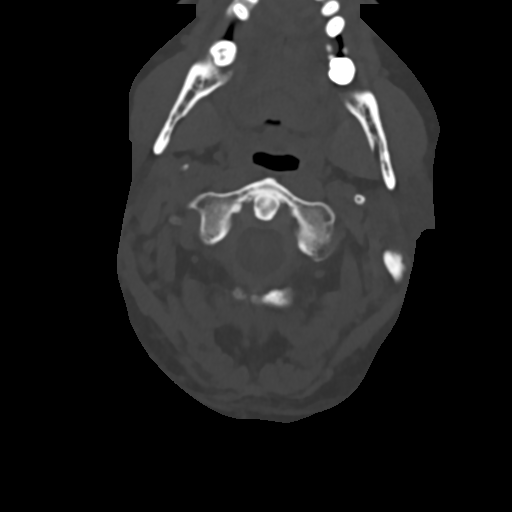

[Series 5: c_spine 2.0 sag bone · sagittal · 0.33mm/px · 5 of 61 slices shown, 6 images]
[im 21/61  bone]
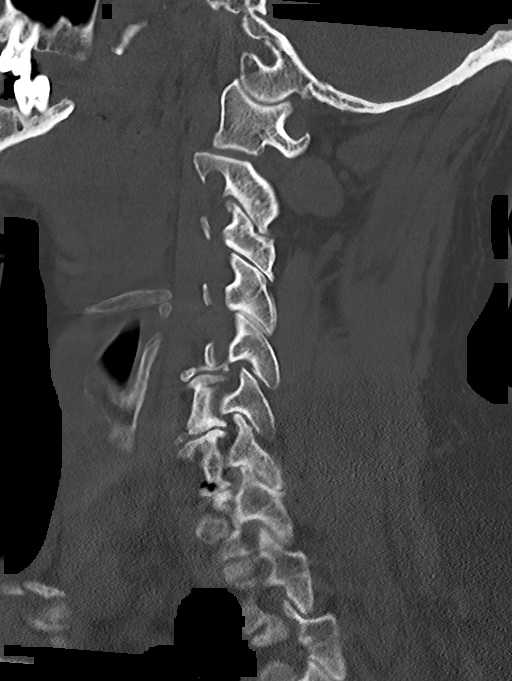
[im 26/61  bone]
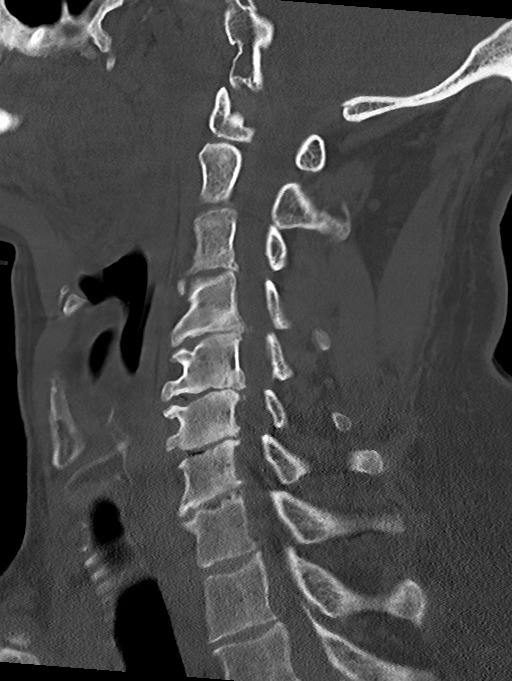
[im 31/61  soft-tissue]
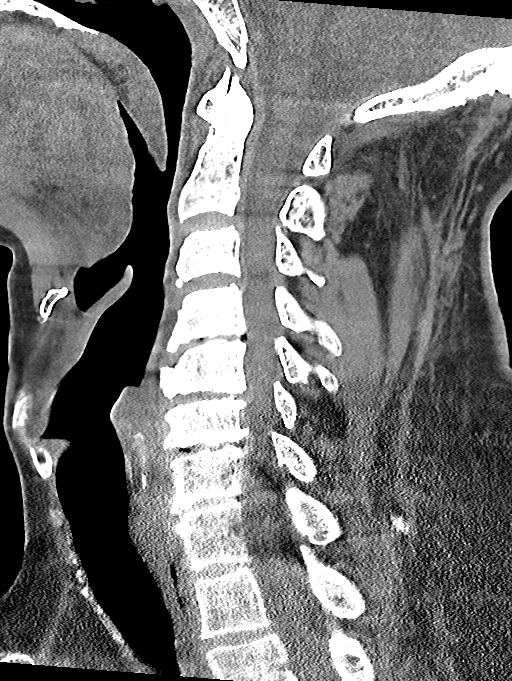
[im 31/61  bone]
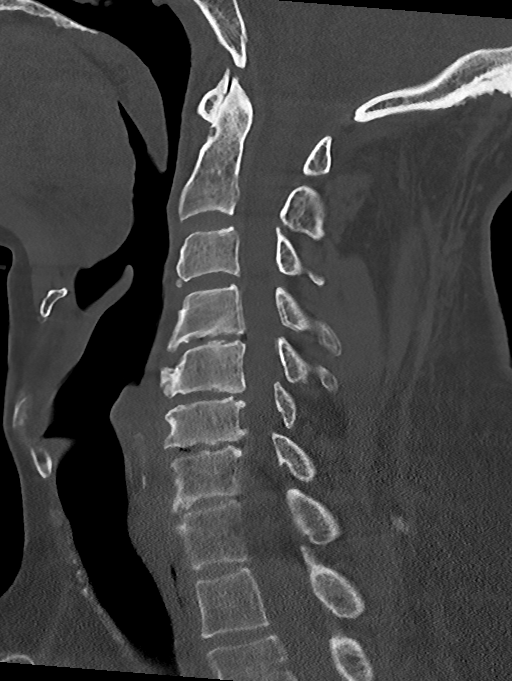
[im 36/61  bone]
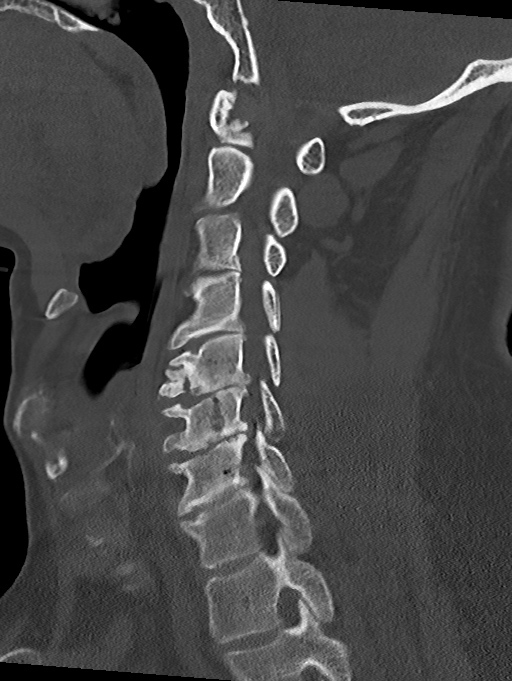
[im 41/61  bone]
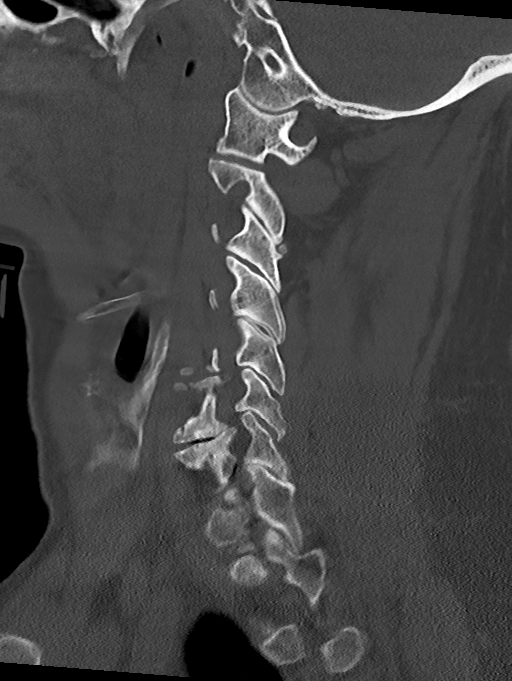

[Series 6: c_spine 2.0 cor bone · coronal · 0.33mm/px · 3 of 69 slices shown]
[im 14/69  bone]
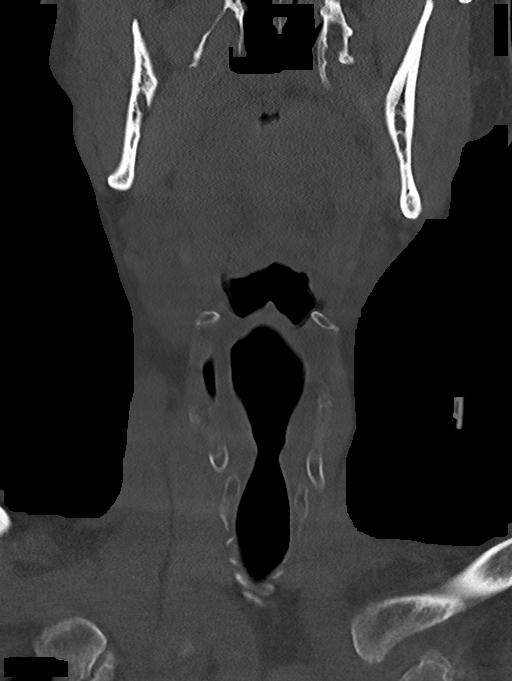
[im 28/69  bone]
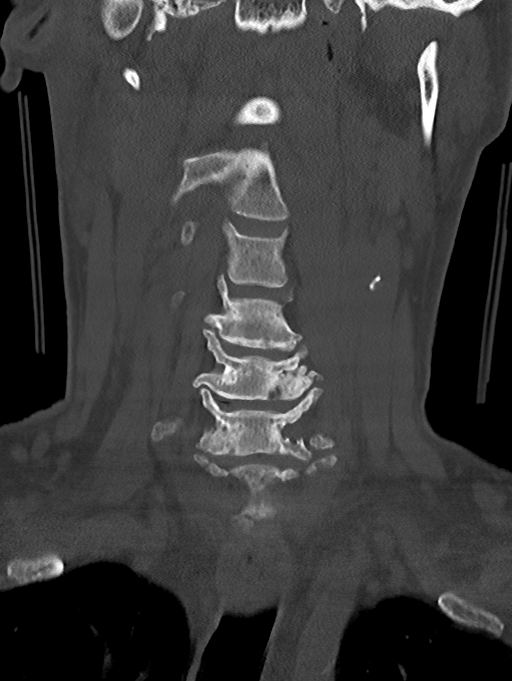
[im 41/69  bone]
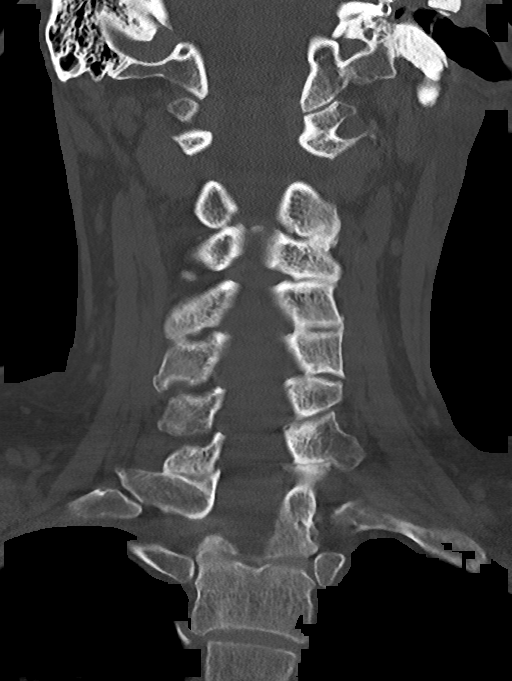

[13 of 33 positions shown; findings below may reference images not displayed]

FINDINGS: CT HEAD FINDINGS

Brain: There is no evidence for acute hemorrhage, hydrocephalus,
mass lesion, or abnormal extra-axial fluid collection. No definite
CT evidence for acute infarction. Diffuse loss of parenchymal volume
is consistent with atrophy. Patchy low attenuation in the deep
hemispheric and periventricular white matter is nonspecific, but
likely reflects chronic microvascular ischemic demyelination.

Vascular: No hyperdense vessel or unexpected calcification.

Skull: No evidence for skull fracture.

Sinuses/Orbits: Chronic opacification left mastoid air cells, seen
previously. Right mastoid air cells are aerated. There is some
mucosal thickening in scattered ethmoid air cells. Frontal sinuses,
sphenoid sinuses, and maxillary sinuses are clear.

Other: Right parietal scalp contusion is evident.

CT CERVICAL SPINE FINDINGS

Alignment: Imaging was obtained from the skullbase through the T2-3
interspace. Alignment is anatomic.

Skull base and vertebrae: No evidence of fracture.

Soft tissues and spinal canal: Posterior spurring generates
multilevel central canal narrowing.

Disc levels: Loss of disc height seen at C4-5, C5-6, C6-7, and
C7-T1.

Upper chest: Unremarkable.

Other: No prevertebral soft tissue swelling.
IMPRESSION: 1. No acute intracranial abnormality.
2. Degenerative changes in the midcervical spine without fracture.
3. Chronic opacification left mastoid air cells and middle ear.
4. Right parietal scalp contusion.

## 2016-10-03 IMAGING — CT CT HEAD W/O CM
4 series · 16 of 47 positions shown, 18 images · non-contrast
Comparison: 01/23/2012

CLINICAL DATA: Patient fell backwards and hit head on pavement.

EXAM:
CT HEAD WITHOUT CONTRAST
CT CERVICAL SPINE WITHOUT CONTRAST
TECHNIQUE: Multidetector CT imaging of the head and cervical spine was
performed following the standard protocol without intravenous
contrast. Multiplanar CT image reconstructions of the cervical spine
were also generated.

[Series 2: head without · axial · non-contrast · 0.47mm/px · z∈[-294,-159]mm · 7 of 37 slices shown, 9 images]
[im 5/37  brain]
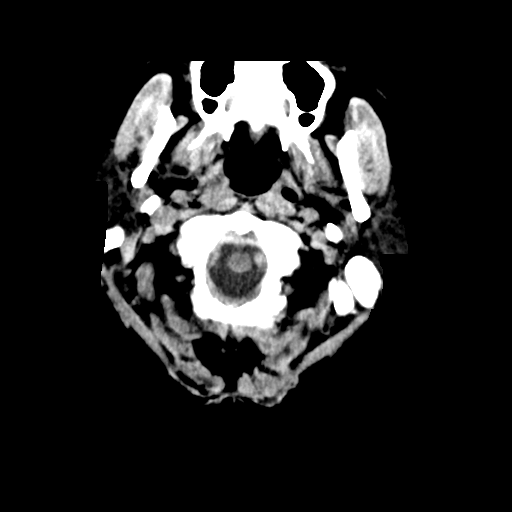
[im 5/37  bone]
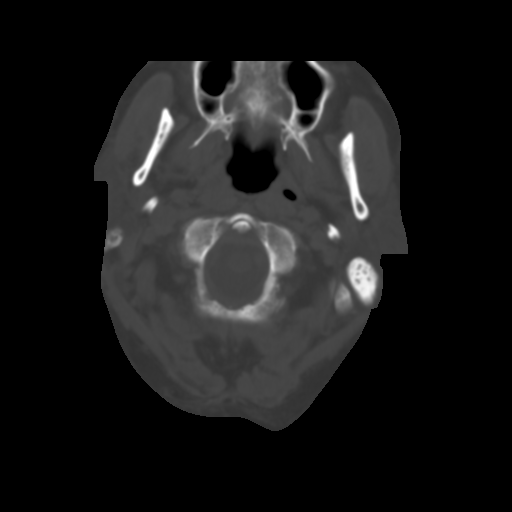
[im 10/37  brain]
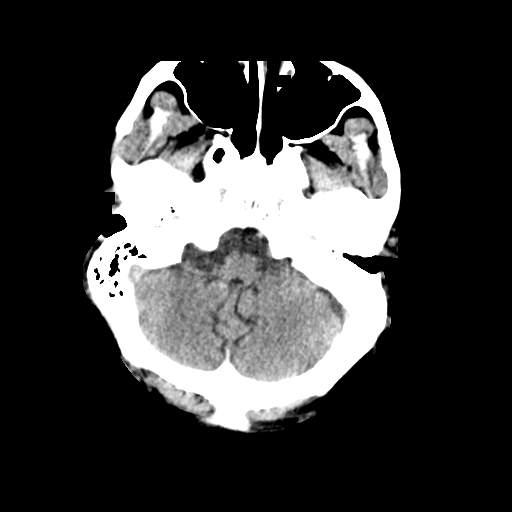
[im 14/37  brain]
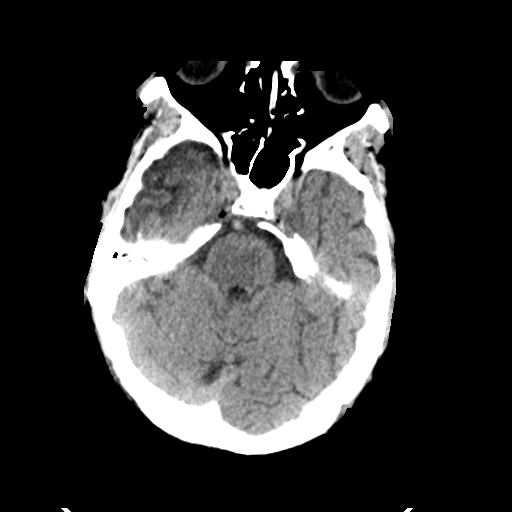
[im 19/37  brain]
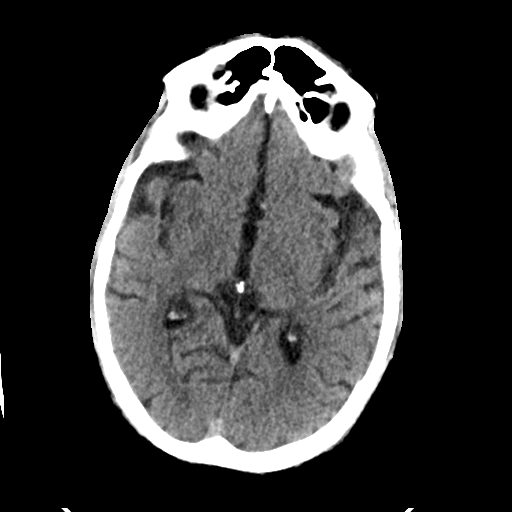
[im 23/37  brain]
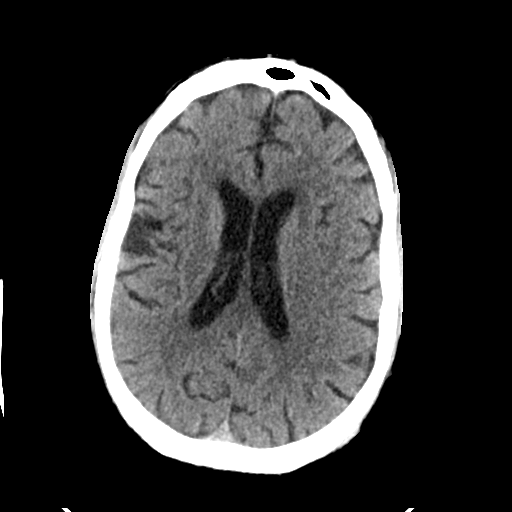
[im 23/37  bone]
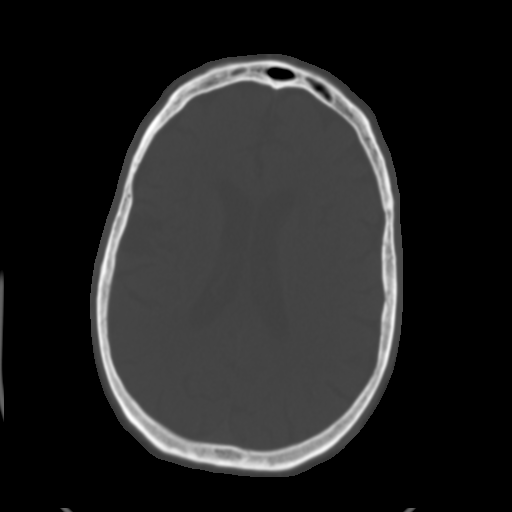
[im 28/37  brain]
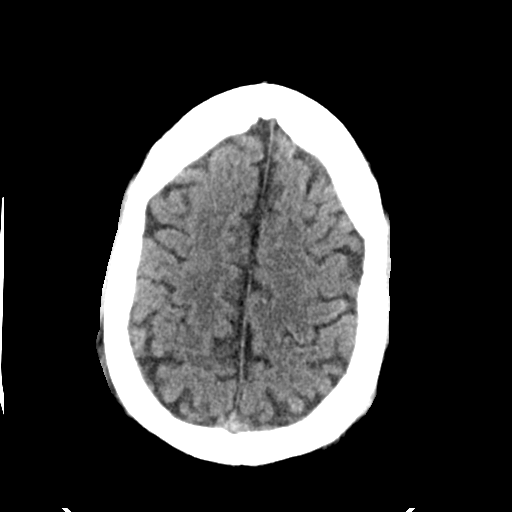
[im 32/37  brain]
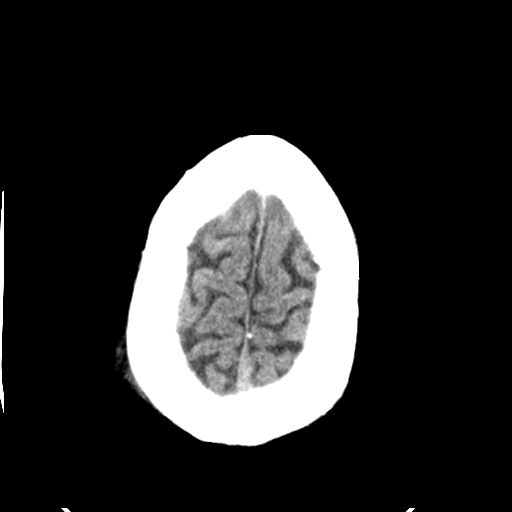

[Series 3: head bone · axial · 0.47mm/px · z∈[-296,-260]mm · 3 of 92 slices shown]
[im 10/92  bone]
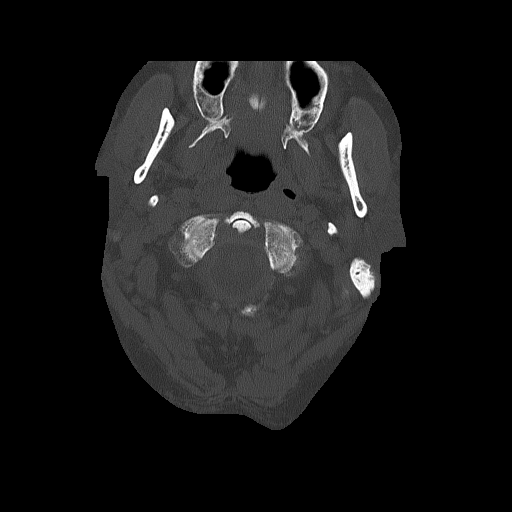
[im 19/92  bone]
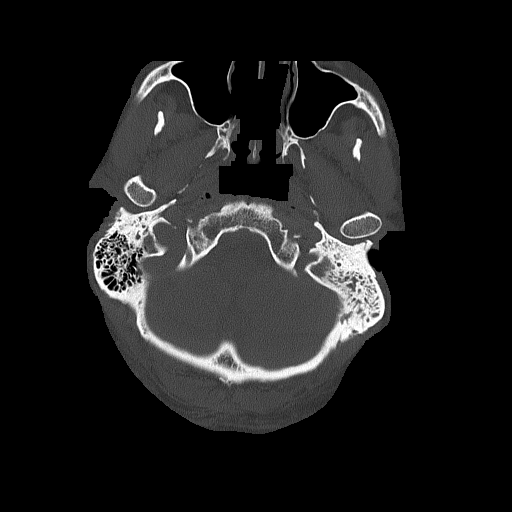
[im 28/92  bone]
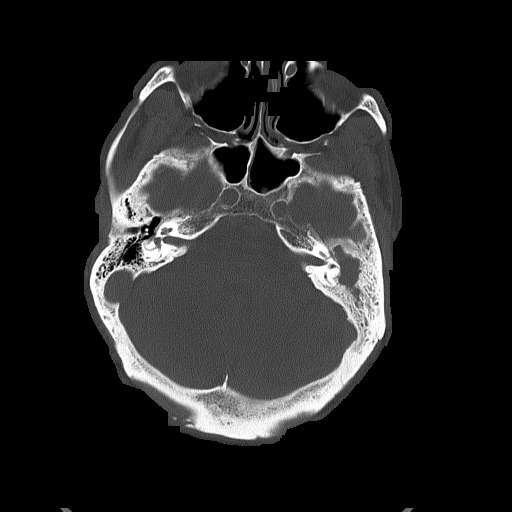

[Series 4: head without cor · coronal · non-contrast · 0.36mm/px · 3 of 74 slices shown]
[im 25/74  brain]
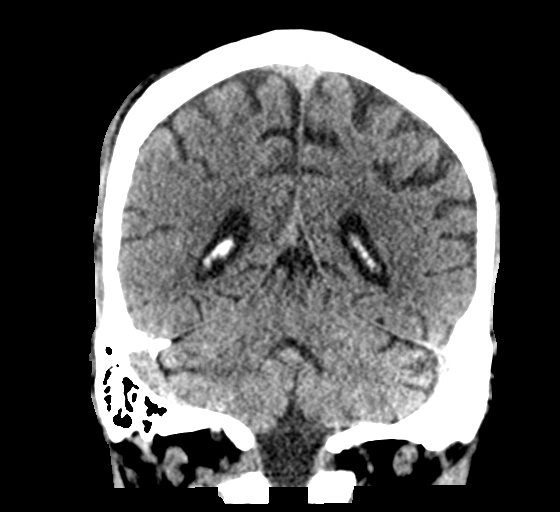
[im 33/74  brain]
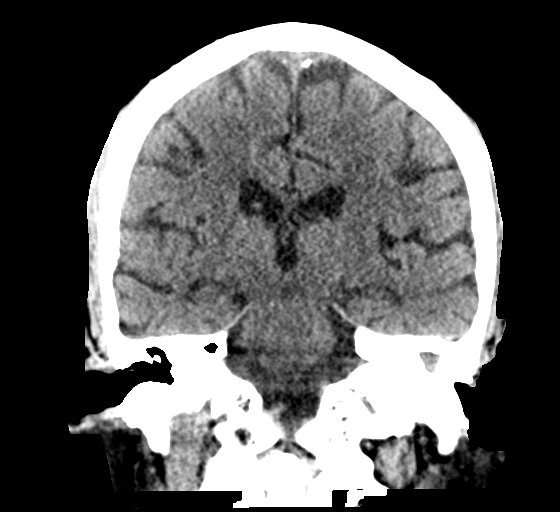
[im 41/74  brain]
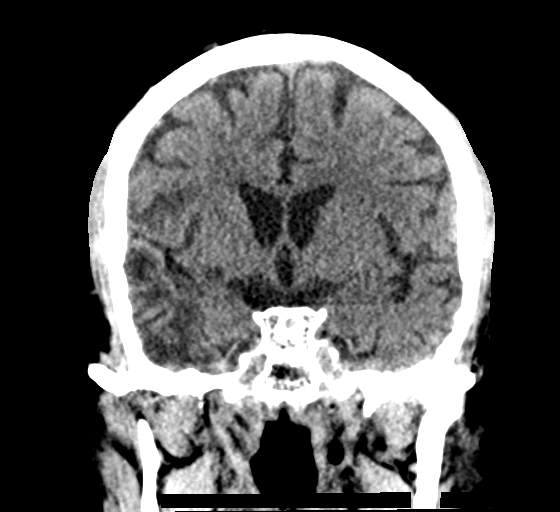

[Series 5: head without sag · sagittal · non-contrast · 0.36mm/px · 3 of 67 slices shown]
[im 24/67  brain]
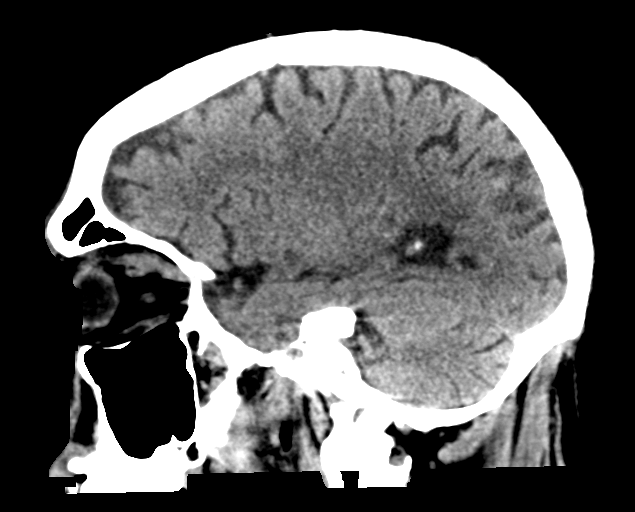
[im 34/67  brain]
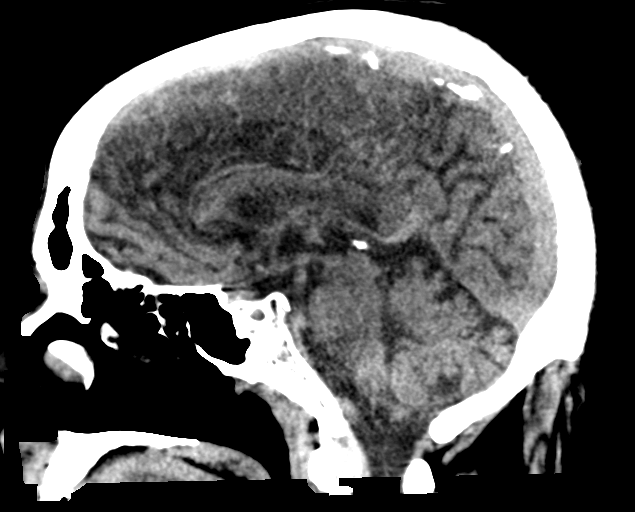
[im 43/67  brain]
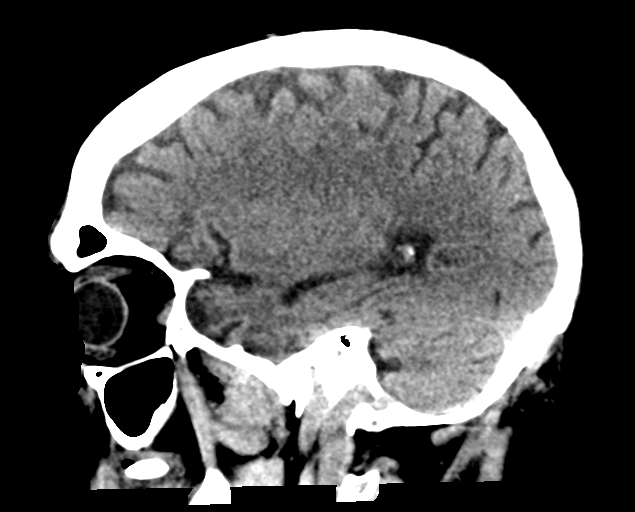

[16 of 47 positions shown; findings below may reference images not displayed]

FINDINGS: CT HEAD FINDINGS

Brain: There is no evidence for acute hemorrhage, hydrocephalus,
mass lesion, or abnormal extra-axial fluid collection. No definite
CT evidence for acute infarction. Diffuse loss of parenchymal volume
is consistent with atrophy. Patchy low attenuation in the deep
hemispheric and periventricular white matter is nonspecific, but
likely reflects chronic microvascular ischemic demyelination.

Vascular: No hyperdense vessel or unexpected calcification.

Skull: No evidence for skull fracture.

Sinuses/Orbits: Chronic opacification left mastoid air cells, seen
previously. Right mastoid air cells are aerated. There is some
mucosal thickening in scattered ethmoid air cells. Frontal sinuses,
sphenoid sinuses, and maxillary sinuses are clear.

Other: Right parietal scalp contusion is evident.

CT CERVICAL SPINE FINDINGS

Alignment: Imaging was obtained from the skullbase through the T2-3
interspace. Alignment is anatomic.

Skull base and vertebrae: No evidence of fracture.

Soft tissues and spinal canal: Posterior spurring generates
multilevel central canal narrowing.

Disc levels: Loss of disc height seen at C4-5, C5-6, C6-7, and
C7-T1.

Upper chest: Unremarkable.

Other: No prevertebral soft tissue swelling.
IMPRESSION: 1. No acute intracranial abnormality.
2. Degenerative changes in the midcervical spine without fracture.
3. Chronic opacification left mastoid air cells and middle ear.
4. Right parietal scalp contusion.

## 2016-12-18 ENCOUNTER — Encounter (HOSPITAL_COMMUNITY): Payer: Self-pay

## 2016-12-18 ENCOUNTER — Inpatient Hospital Stay (HOSPITAL_COMMUNITY)
Admission: EM | Admit: 2016-12-18 | Discharge: 2016-12-24 | DRG: 378 | Disposition: A | Discharge status: Skilled Nursing Facility | Payer: Medicaid Other | Attending: Internal Medicine | Admitting: Internal Medicine

## 2016-12-18 DIAGNOSIS — R5381 Other malaise: Secondary | ICD-10-CM

## 2016-12-18 DIAGNOSIS — K921 Melena: Secondary | ICD-10-CM

## 2016-12-18 DIAGNOSIS — K922 Gastrointestinal hemorrhage, unspecified: Secondary | ICD-10-CM | POA: Diagnosis present

## 2016-12-18 DIAGNOSIS — D649 Anemia, unspecified: Secondary | ICD-10-CM

## 2016-12-18 DIAGNOSIS — Z79899 Other long term (current) drug therapy: Secondary | ICD-10-CM

## 2016-12-18 DIAGNOSIS — I10 Essential (primary) hypertension: Secondary | ICD-10-CM | POA: Diagnosis present

## 2016-12-18 DIAGNOSIS — Z87820 Personal history of traumatic brain injury: Secondary | ICD-10-CM

## 2016-12-18 DIAGNOSIS — Z8711 Personal history of peptic ulcer disease: Secondary | ICD-10-CM

## 2016-12-18 DIAGNOSIS — Z720 Tobacco use: Secondary | ICD-10-CM

## 2016-12-18 DIAGNOSIS — K264 Chronic or unspecified duodenal ulcer with hemorrhage: Principal | ICD-10-CM | POA: Diagnosis present

## 2016-12-18 DIAGNOSIS — Z8249 Family history of ischemic heart disease and other diseases of the circulatory system: Secondary | ICD-10-CM

## 2016-12-18 DIAGNOSIS — F319 Bipolar disorder, unspecified: Secondary | ICD-10-CM | POA: Diagnosis present

## 2016-12-18 DIAGNOSIS — F17210 Nicotine dependence, cigarettes, uncomplicated: Secondary | ICD-10-CM | POA: Diagnosis present

## 2016-12-18 DIAGNOSIS — F1010 Alcohol abuse, uncomplicated: Secondary | ICD-10-CM | POA: Diagnosis present

## 2016-12-18 DIAGNOSIS — D62 Acute posthemorrhagic anemia: Secondary | ICD-10-CM | POA: Diagnosis present

## 2016-12-18 LAB — POC OCCULT BLOOD, ED: Fecal Occult Bld: POSITIVE — AB

## 2016-12-18 NOTE — ED Notes (Signed)
[facility] Expected date:  Expected time:  Means of arrival:  Comments: Fall, diarrhea, weakness

## 2016-12-18 NOTE — ED Notes (Signed)
Pt complains of diarrhea for three weeks and he complains of lower abdominal pain

## 2016-12-19 DIAGNOSIS — F1010 Alcohol abuse, uncomplicated: Secondary | ICD-10-CM | POA: Diagnosis present

## 2016-12-19 DIAGNOSIS — D649 Anemia, unspecified: Secondary | ICD-10-CM | POA: Diagnosis present

## 2016-12-19 DIAGNOSIS — F319 Bipolar disorder, unspecified: Secondary | ICD-10-CM | POA: Diagnosis present

## 2016-12-19 DIAGNOSIS — K921 Melena: Secondary | ICD-10-CM

## 2016-12-19 DIAGNOSIS — Z8249 Family history of ischemic heart disease and other diseases of the circulatory system: Secondary | ICD-10-CM | POA: Diagnosis not present

## 2016-12-19 DIAGNOSIS — K264 Chronic or unspecified duodenal ulcer with hemorrhage: Secondary | ICD-10-CM | POA: Diagnosis present

## 2016-12-19 DIAGNOSIS — D62 Acute posthemorrhagic anemia: Secondary | ICD-10-CM

## 2016-12-19 DIAGNOSIS — Z87820 Personal history of traumatic brain injury: Secondary | ICD-10-CM | POA: Diagnosis not present

## 2016-12-19 DIAGNOSIS — I10 Essential (primary) hypertension: Secondary | ICD-10-CM | POA: Diagnosis present

## 2016-12-19 DIAGNOSIS — K922 Gastrointestinal hemorrhage, unspecified: Secondary | ICD-10-CM | POA: Diagnosis not present

## 2016-12-19 DIAGNOSIS — Z8711 Personal history of peptic ulcer disease: Secondary | ICD-10-CM | POA: Diagnosis not present

## 2016-12-19 DIAGNOSIS — F17210 Nicotine dependence, cigarettes, uncomplicated: Secondary | ICD-10-CM | POA: Diagnosis present

## 2016-12-19 DIAGNOSIS — Z79899 Other long term (current) drug therapy: Secondary | ICD-10-CM | POA: Diagnosis not present

## 2016-12-19 LAB — URINALYSIS, ROUTINE W REFLEX MICROSCOPIC
BILIRUBIN URINE: NEGATIVE
GLUCOSE, UA: NEGATIVE mg/dL
KETONES UR: NEGATIVE mg/dL
Leukocytes, UA: NEGATIVE
Nitrite: NEGATIVE
PROTEIN: NEGATIVE mg/dL
Specific Gravity, Urine: 1.016 (ref 1.005–1.030)
pH: 5 (ref 5.0–8.0)

## 2016-12-19 LAB — ABO/RH: ABO/RH(D): A POS

## 2016-12-19 LAB — CBC
HCT: 10.5 % — ABNORMAL LOW (ref 39.0–52.0)
Hemoglobin: 3.6 g/dL — CL (ref 13.0–17.0)
MCH: 34 pg (ref 26.0–34.0)
MCHC: 34.3 g/dL (ref 30.0–36.0)
MCV: 99.1 fL (ref 78.0–100.0)
PLATELETS: 241 10*3/uL (ref 150–400)
RBC: 1.06 MIL/uL — AB (ref 4.22–5.81)
RDW: 13.9 % (ref 11.5–15.5)
WBC: 8.2 10*3/uL (ref 4.0–10.5)

## 2016-12-19 LAB — COMPREHENSIVE METABOLIC PANEL
ALK PHOS: 42 U/L (ref 38–126)
ALT: 10 U/L — AB (ref 17–63)
AST: 25 U/L (ref 15–41)
Albumin: 2.6 g/dL — ABNORMAL LOW (ref 3.5–5.0)
Anion gap: 11 (ref 5–15)
BUN: 18 mg/dL (ref 6–20)
CALCIUM: 7.5 mg/dL — AB (ref 8.9–10.3)
CO2: 17 mmol/L — AB (ref 22–32)
CREATININE: 0.87 mg/dL (ref 0.61–1.24)
Chloride: 104 mmol/L (ref 101–111)
Glucose, Bld: 176 mg/dL — ABNORMAL HIGH (ref 65–99)
Potassium: 4.1 mmol/L (ref 3.5–5.1)
Sodium: 132 mmol/L — ABNORMAL LOW (ref 135–145)
Total Bilirubin: 0.2 mg/dL — ABNORMAL LOW (ref 0.3–1.2)
Total Protein: 4.8 g/dL — ABNORMAL LOW (ref 6.5–8.1)

## 2016-12-19 LAB — ETHANOL: Alcohol, Ethyl (B): <5 mg/dL (ref <5)

## 2016-12-19 LAB — PROTIME-INR
INR: 1.34
Prothrombin Time: 16.6 seconds — ABNORMAL HIGH (ref 11.4–15.2)

## 2016-12-19 LAB — PREPARE RBC (CROSSMATCH)

## 2016-12-19 LAB — MRSA PCR SCREENING: MRSA BY PCR: NEGATIVE

## 2016-12-19 LAB — HEMOGLOBIN AND HEMATOCRIT, BLOOD
HCT: 14.6 % — ABNORMAL LOW (ref 39.0–52.0)
Hemoglobin: 5.1 g/dL — CL (ref 13.0–17.0)

## 2016-12-19 LAB — LIPASE, BLOOD: Lipase: 24 U/L (ref 11–51)

## 2016-12-19 MED ORDER — NICOTINE 14 MG/24HR TD PT24
14.0000 mg | MEDICATED_PATCH | Freq: Every day | TRANSDERMAL | Status: DC
  Administered 2016-12-20 – 2016-12-24 (×6): 14 mg via TRANSDERMAL
  Filled 2016-12-19 (×7): qty 1

## 2016-12-19 MED ORDER — GABAPENTIN 300 MG PO CAPS
600.0000 mg | ORAL_CAPSULE | Freq: Two times a day (BID) | ORAL | Status: DC
  Administered 2016-12-19 – 2016-12-24 (×11): 600 mg via ORAL
  Filled 2016-12-19 (×11): qty 2

## 2016-12-19 MED ORDER — METHOCARBAMOL 500 MG PO TABS
750.0000 mg | ORAL_TABLET | Freq: Two times a day (BID) | ORAL | Status: DC
  Administered 2016-12-19 – 2016-12-24 (×11): 750 mg via ORAL
  Filled 2016-12-19 (×11): qty 2

## 2016-12-19 MED ORDER — PAROXETINE HCL 20 MG PO TABS
20.0000 mg | ORAL_TABLET | Freq: Every day | ORAL | Status: DC
  Administered 2016-12-19 – 2016-12-24 (×6): 20 mg via ORAL
  Filled 2016-12-19 (×6): qty 1

## 2016-12-19 MED ORDER — SODIUM CHLORIDE 0.9 % IV SOLN
10.0000 mL/h | Freq: Once | INTRAVENOUS | Status: AC
  Administered 2016-12-19: 10 mL/h via INTRAVENOUS

## 2016-12-19 MED ORDER — SODIUM CHLORIDE 0.9 % IV SOLN
Freq: Once | INTRAVENOUS | Status: AC
  Administered 2016-12-19: 12:00:00 via INTRAVENOUS

## 2016-12-19 MED ORDER — LORAZEPAM 2 MG/ML IJ SOLN
2.0000 mg | INTRAMUSCULAR | Status: DC | PRN
  Administered 2016-12-21: 2 mg via INTRAVENOUS
  Filled 2016-12-19: qty 1

## 2016-12-19 MED ORDER — PANTOPRAZOLE SODIUM 40 MG IV SOLR: 40.0000 mg | Freq: Two times a day (BID) | INTRAVENOUS | Status: DC

## 2016-12-19 MED ORDER — THIAMINE HCL 100 MG/ML IJ SOLN
100.0000 mg | Freq: Every day | INTRAMUSCULAR | Status: DC
  Administered 2016-12-19 – 2016-12-22 (×4): 100 mg via INTRAVENOUS
  Filled 2016-12-19 (×4): qty 2

## 2016-12-19 MED ORDER — QUETIAPINE FUMARATE 25 MG PO TABS
50.0000 mg | ORAL_TABLET | Freq: Every day | ORAL | Status: DC | PRN
  Administered 2016-12-21: 50 mg via ORAL
  Filled 2016-12-19: qty 2

## 2016-12-19 MED ORDER — FOLIC ACID 5 MG/ML IJ SOLN
1.0000 mg | Freq: Every day | INTRAMUSCULAR | Status: DC
  Administered 2016-12-19 – 2016-12-22 (×4): 1 mg via INTRAVENOUS
  Filled 2016-12-19 (×5): qty 0.2

## 2016-12-19 MED ORDER — FUROSEMIDE 10 MG/ML IJ SOLN
40.0000 mg | Freq: Once | INTRAMUSCULAR | Status: AC
  Administered 2016-12-19: 40 mg via INTRAVENOUS
  Filled 2016-12-19: qty 4

## 2016-12-19 MED ORDER — SODIUM CHLORIDE 0.9 % IV SOLN
80.0000 mg | Freq: Once | INTRAVENOUS | Status: AC
  Administered 2016-12-19: 80 mg via INTRAVENOUS
  Filled 2016-12-19: qty 80

## 2016-12-19 MED ORDER — PANTOPRAZOLE SODIUM 40 MG IV SOLR
40.0000 mg | Freq: Once | INTRAVENOUS | Status: AC
  Administered 2016-12-19: 40 mg via INTRAVENOUS
  Filled 2016-12-19: qty 40

## 2016-12-19 MED ORDER — SODIUM CHLORIDE 0.9 % IV SOLN
8.0000 mg/h | INTRAVENOUS | Status: DC
  Administered 2016-12-19 – 2016-12-21 (×5): 8 mg/h via INTRAVENOUS
  Filled 2016-12-19 (×10): qty 80

## 2016-12-19 NOTE — Progress Notes (Signed)
Patient seen and examined. Admitted after midnight secondary to acute blood loss and symptomatic anemia. Hgb on admission 3.6. Patient with hx of alcohol abuse and NSAID's use; most likely upper GI bleed. Currently hemodynamically stable and has received 2 units of PRBC's (repeat Hgb 5.1). GI has been consulted and planning to perform EGD later today or in am (if no EGD until AM; looking to have diet advance to CLD and NPO after midnight). Continue IV protonix. Please refer to H&P written by Dr. Julian ReilGardner for further info/details on admission.   Plan: -will continue IV PPI and SCD's (avoid any heparin products and NSAIDs) -will transfuse 2 more units of PRBC's -follow Hgb trend -will follow GI recommendations. -medications with Sips of water and Ice chips ok.  Vassie LollMadera, Arieon Scalzo [phone]

## 2016-12-19 NOTE — Progress Notes (Signed)
Have discussed case w/ Dr. Amedeo Plenty who did consult, and have reviewed labs.  Since BUN is now nl and bleeding has been going for wks, it appears that pt is not acutely/actively bleeding at this time, and that his profound anemia is the consequence of accumulated recent blood loss, rather than acute current bld loss.   Therefore, egd is semi-elective rather than emergent, and I have moved it tomorrow for 2 reasons:  1. Pt is planned for further transfusion today, which will make it safer to do egd once hgb is more normalized.   2. We will have the availability of MAC anesthesia tomorrow (but not today)--given his h/o etoh, this will make procedure smoother/safer/more successful.  Of course, if pt develops destabilizing bleeding today, we could do emergent egd at any time.  Cleotis Nipper, M.D. Pager [phone] If no answer or after 5 PM call [phone]

## 2016-12-19 NOTE — ED Provider Notes (Signed)
Patient seen/examined in the Emergency Department in conjunction with [facility] Patient reports abdominal pain and rectal bleeding Exam : awake/alert, mild abdominal tenderness Plan: admit for GI bleed/acute anemia Pt is awake/alert at this time He is accepting blood products     Johnny Rhineonald Kieli Golladay, MD 12/19/16 [phone]

## 2016-12-19 NOTE — ED Notes (Signed)
Pt instructed to use call bell when urine specimen is ready. Urinal by bedside.

## 2016-12-19 NOTE — H&P (Signed)
History and Physical    Johnny Banks:[id number] DOB: [birth year] DOA: 12/18/2016   PCP: Dorrene German, MD Chief Complaint:  Chief Complaint  Patient presents with  . Rectal Bleeding    HPI: Johnny Banks is a 56 y.o. male with medical history significant of EtOH abuse.  Patient presents to the ED with 3 week history of black tarry stool which has become bloody recently.  Last night he "felt like he was going to die".  Symptoms have been especially bad over the past 4 days.  Symptoms include lightheadedness, weakness, abd pain, nausea.  He has been self medicating with EtOH taking up to 3x 42 oz beers a day.  ED Course: HGB 3.6. No obvious hematemesis or large volume melena while in the ED.  Worth noting that his HGB back in 2016 was 15.  2 units PRBC ordered for transfusion.  Review of Systems: As per HPI otherwise 10 point review of systems negative.    Past Medical History:  Diagnosis Date  . Bipolar 1 disorder (HCC)   . Hypertension   . TBI (traumatic brain injury) [facility])     Past Surgical History:  Procedure Laterality Date  . APPENDECTOMY    . HIP FRACTURE SURGERY       reports that he has been smoking Cigarettes.  He has been smoking about 2.00 packs per day. He has never used smokeless tobacco. He reports that he drinks alcohol. He reports that he uses drugs, including Marijuana.  No Known Allergies  Family History  Problem Relation Age of Onset  . Asthma Mother   . Heart failure Mother   . Hypertension Father   . Depression Sister   . Alcohol abuse Other       Prior to Admission medications   Medication Sig Start Date End Date Taking? Authorizing Provider  diclofenac (VOLTAREN) 75 MG EC tablet Take 75 mg by mouth 2 (two) times daily.   Yes Historical Provider, MD  gabapentin (NEURONTIN) 300 MG capsule Take 600 mg by mouth 2 (two) times daily.    Yes Historical Provider, MD  methocarbamol (ROBAXIN) 750 MG tablet Take 750 mg by mouth 2 (two) times daily.    Yes Historical Provider, MD  PARoxetine (PAXIL) 20 MG tablet Take 20 mg by mouth daily.   Yes Historical Provider, MD  QUEtiapine (SEROQUEL) 50 MG tablet Take 50 mg by mouth daily as needed (for sleep).    Yes Historical Provider, MD    Physical Exam: Vitals:   12/19/16 0333 12/19/16 0353 12/19/16 0400 12/19/16 0430  BP: 101/65 106/69 102/56 112/72  Pulse: 82 80    Resp: 18 18  14   Temp: 98.5 F (36.9 C) 98.6 F (37 C)    TempSrc: Oral Oral    SpO2: 90% 90%    Weight:      Height:          Constitutional: NAD, calm, comfortable Eyes: PERRL, lids and conjunctivae normal ENMT: Mucous membranes are moist. Posterior pharynx clear of any exudate or lesions.Normal dentition.  Neck: normal, supple, no masses, no thyromegaly Respiratory: clear to auscultation bilaterally, no wheezing, no crackles. Normal respiratory effort. No accessory muscle use.  Cardiovascular: Regular rate and rhythm, no murmurs / rubs / gallops. No extremity edema. 2+ pedal pulses. No carotid bruits.  Abdomen: no tenderness, no masses palpated. No hepatosplenomegaly. Bowel sounds positive.  Musculoskeletal: no clubbing / cyanosis. No joint deformity upper and lower extremities. Good ROM, no contractures. Normal muscle  tone.  Skin: no rashes, lesions, ulcers. No induration Neurologic: CN 2-12 grossly intact. Sensation intact, DTR normal. Strength 5/5 in all 4.  Psychiatric: Normal judgment and insight. Alert and oriented x 3. Normal mood.    Labs on Admission: I have personally reviewed following labs and imaging studies  CBC:  Recent Labs Lab 12/18/16 2351  WBC 8.2  HGB 3.6*  HCT 10.5*  MCV 99.1  PLT 241   Basic Metabolic Panel:  Recent Labs Lab 12/18/16 2351  NA 132*  K 4.1  CL 104  CO2 17*  GLUCOSE 176*  BUN 18  CREATININE 0.87  CALCIUM 7.5*   GFR: Estimated Creatinine Clearance: 105.9 mL/min (by C-G formula based on SCr of 0.87 mg/dL). Liver Function Tests:  Recent Labs Lab  12/18/16 2351  AST 25  ALT 10*  ALKPHOS 42  BILITOT 0.2*  PROT 4.8*  ALBUMIN 2.6*    Recent Labs Lab 12/18/16 2351  LIPASE 24   No results for input(s): AMMONIA in the last 168 hours. Coagulation Profile: No results for input(s): INR, PROTIME in the last 168 hours. Cardiac Enzymes: No results for input(s): CKTOTAL, CKMB, CKMBINDEX, TROPONINI in the last 168 hours. BNP (last 3 results) No results for input(s): PROBNP in the last 8760 hours. HbA1C: No results for input(s): HGBA1C in the last 72 hours. CBG: No results for input(s): GLUCAP in the last 168 hours. Lipid Profile: No results for input(s): CHOL, HDL, LDLCALC, TRIG, CHOLHDL, LDLDIRECT in the last 72 hours. Thyroid Function Tests: No results for input(s): TSH, T4TOTAL, FREET4, T3FREE, THYROIDAB in the last 72 hours. Anemia Panel: No results for input(s): VITAMINB12, FOLATE, FERRITIN, TIBC, IRON, RETICCTPCT in the last 72 hours. Urine analysis:    Component Value Date/Time   COLORURINE YELLOW 12/19/2016 0259   APPEARANCEUR HAZY (A) 12/19/2016 0259   LABSPEC 1.016 12/19/2016 0259   PHURINE 5.0 12/19/2016 0259   GLUCOSEU NEGATIVE 12/19/2016 0259   HGBUR SMALL (A) 12/19/2016 0259   BILIRUBINUR NEGATIVE 12/19/2016 0259   KETONESUR NEGATIVE 12/19/2016 0259   PROTEINUR NEGATIVE 12/19/2016 0259   UROBILINOGEN 0.2 09/23/2015 1107   NITRITE NEGATIVE 12/19/2016 0259   LEUKOCYTESUR NEGATIVE 12/19/2016 0259   Sepsis Labs: @LABRCNTIP (procalcitonin:4,lacticidven:4) )No results found for this or any previous visit (from the past 240 hour(s)).   Radiological Exams on Admission: No results found.  EKG: Independently reviewed.  Assessment/Plan Principal Problem:   Melena Active Problems:   Acute blood loss anemia   ETOH abuse   UGIB (upper gastrointestinal bleed)    1. UGIB with Melena and resulting acute blood loss anemia - 1. 2 unit PRBC being transfused 2. Repeat H/H ordered for 0800 3. GI to see in AM,  presumably for EGD 4. DDx includes EtOH gastritis, esophageal / gastric varcies (although he has no known history of cirrhosis yet), NSAID ulcer from the diclofenac, and others) 5. Tele monitor 2. EtOH abuse - CIWA protocol, folate and thiamine   DVT prophylaxis: SCDs Code Status: Full Family Communication: No family in room Consults called: Dr. Madilyn FiremanHayes called by EDP and will see patient in AM Admission status: Admit to inpatient   Hillary BowGARDNER, JARED M. DO [facility] Pager [phone] from 7PM-7AM  If 7AM-7PM, please contact the day physician for the patient [email or web address] Password [email or web address]  12/19/2016, 5:06 AM

## 2016-12-19 NOTE — Consult Note (Signed)
[facility] Consult Note  Referring Provider: No ref. provider found Primary Care Physician:  Philis Fendt, MD Primary Gastroenterologist:  Dr.  Laurel Dimmer Complaint: Black and bloody stool HPI: Johnny Banks is an 56 y.o. white male  who presents with a two-week history of melena becoming more bloody in the last 2 days with vague dyspepsia and weakness. He takes 2 diclofenac a day and admits to drinking 340 ounce beers per day chronically. He has no history of GI bleeding no known history of chronic liver disease. He's never had a colonoscopy or EGD.  Past Medical History:  Diagnosis Date  . Bipolar 1 disorder ([place])   . Hypertension   . TBI (traumatic brain injury) [facility])     Past Surgical History:  Procedure Laterality Date  . APPENDECTOMY    . HIP FRACTURE SURGERY       (Not in a hospital admission)  Allergies: No Known Allergies  Family History  Problem Relation Age of Onset  . Asthma Mother   . Heart failure Mother   . Hypertension Father   . Depression Sister   . Alcohol abuse Other     Social History:  reports that he has been smoking Cigarettes.  He has been smoking about 2.00 packs per day. He has never used smokeless tobacco. He reports that he drinks alcohol. He reports that he uses drugs, including Marijuana.  Review of Systems: negative except As above   Blood pressure 106/69, pulse 77, temperature 98 F (36.7 C), resp. rate 17, height '6\' 1"'$  (1.854 m), weight 83.9 kg (185 lb), SpO2 100 %. Head: Normocephalic, without obvious abnormality, atraumatic Neck: no adenopathy, no carotid bruit, no JVD, supple, symmetrical, trachea midline and thyroid not enlarged, symmetric, no tenderness/mass/nodules Resp: clear to auscultation bilaterally Cardio: regular rate and rhythm, S1, S2 normal, no murmur, click, rub or gallop GI: Abdomen soft mildly tender in epigastrium no hepatosplenomegaly mass or guarding Extremities: extremities normal, atraumatic, no  cyanosis or edema  Results for orders placed or performed during the hospital encounter of 12/18/16 (from the past 48 hour(s))  Comprehensive metabolic panel     Status: Abnormal   Collection Time: 12/18/16 11:51 PM  Result Value Ref Range   Sodium 132 (L) 135 - 145 mmol/L   Potassium 4.1 3.5 - 5.1 mmol/L   Chloride 104 101 - 111 mmol/L   CO2 17 (L) 22 - 32 mmol/L   Glucose, Bld 176 (H) 65 - 99 mg/dL   BUN 18 6 - 20 mg/dL   Creatinine, Ser 0.87 0.61 - 1.24 mg/dL   Calcium 7.5 (L) 8.9 - 10.3 mg/dL   Total Protein 4.8 (L) 6.5 - 8.1 g/dL   Albumin 2.6 (L) 3.5 - 5.0 g/dL   AST 25 15 - 41 U/L   ALT 10 (L) 17 - 63 U/L   Alkaline Phosphatase 42 38 - 126 U/L   Total Bilirubin 0.2 (L) 0.3 - 1.2 mg/dL   GFR calc non Af Amer >60 >60 mL/min   GFR calc Af Amer >60 >60 mL/min    Comment: (NOTE) The eGFR has been calculated using the CKD EPI equation. This calculation has not been validated in all clinical situations. eGFR's persistently <60 mL/min signify possible Chronic Kidney Disease.    Anion gap 11 5 - 15  CBC     Status: Abnormal   Collection Time: 12/18/16 11:51 PM  Result Value Ref Range   WBC 8.2 4.0 - 10.5 K/uL   RBC 1.06 (  L) 4.22 - 5.81 MIL/uL   Hemoglobin 3.6 (LL) 13.0 - 17.0 g/dL    Comment: REPEATED TO VERIFY CRITICAL RESULT CALLED TO, READ BACK BY AND VERIFIED WITH: MARQUEZ,A RN 2.7.18 '@0029'$  ZANDO,C    HCT 10.5 (L) 39.0 - 52.0 %   MCV 99.1 78.0 - 100.0 fL   MCH 34.0 26.0 - 34.0 pg   MCHC 34.3 30.0 - 36.0 g/dL   RDW 13.9 11.5 - 15.5 %   Platelets 241 150 - 400 K/uL  Type and screen [place]     Status: None (Preliminary result)   Collection Time: 12/18/16 11:51 PM  Result Value Ref Range   ISSUE DATE / TIME [id number]    Blood Product Unit Number [id number]    PRODUCT CODE D9741U38    Unit Type and Rh 4536    Blood Product Expiration Date 468032122482    ISSUE DATE / TIME [id number]    Blood Product Unit Number [id number]     PRODUCT CODE L4917H15    Unit Type and Rh 6200    Blood Product Expiration Date [id number]   Lipase, blood     Status: None   Collection Time: 12/18/16 11:51 PM  Result Value Ref Range   Lipase 24 11 - 51 U/L  Ethanol     Status: None   Collection Time: 12/18/16 11:51 PM  Result Value Ref Range   Alcohol, Ethyl (B) <5 <5 mg/dL    Comment:        LOWEST DETECTABLE LIMIT FOR SERUM ALCOHOL IS 5 mg/dL FOR MEDICAL PURPOSES ONLY   ABO/Rh     Status: None   Collection Time: 12/18/16 11:51 PM  Result Value Ref Range   ABO/RH(D) A POS   POC occult blood, ED     Status: Abnormal   Collection Time: 12/18/16 11:57 PM  Result Value Ref Range   Fecal Occult Bld POSITIVE (A) NEGATIVE  Prepare RBC     Status: None   Collection Time: 12/19/16  1:00 AM  Result Value Ref Range   Order Confirmation ORDER PROCESSED BY BLOOD BANK   Urinalysis, Routine w reflex microscopic     Status: Abnormal   Collection Time: 12/19/16  2:59 AM  Result Value Ref Range   Color, Urine YELLOW YELLOW   APPearance HAZY (A) CLEAR   Specific Gravity, Urine 1.016 1.005 - 1.030   pH 5.0 5.0 - 8.0   Glucose, UA NEGATIVE NEGATIVE mg/dL   Hgb urine dipstick SMALL (A) NEGATIVE   Bilirubin Urine NEGATIVE NEGATIVE   Ketones, ur NEGATIVE NEGATIVE mg/dL   Protein, ur NEGATIVE NEGATIVE mg/dL   Nitrite NEGATIVE NEGATIVE   Leukocytes, UA NEGATIVE NEGATIVE   RBC / HPF 0-5 0 - 5 RBC/hpf   WBC, UA 0-5 0 - 5 WBC/hpf   Bacteria, UA RARE (A) NONE SEEN   Squamous Epithelial / LPF 0-5 (A) NONE SEEN   Mucous PRESENT    Hyaline Casts, UA PRESENT   Hemoglobin and hematocrit, blood     Status: Abnormal   Collection Time: 12/19/16  8:31 AM  Result Value Ref Range   Hemoglobin 5.1 (LL) 13.0 - 17.0 g/dL    Comment: CRITICAL RESULT CALLED TO, READ BACK BY AND VERIFIED WITH: STETTLER,H. RN '@0856'$  ON 2.7.18 BY NMCCOY DELTA CHECK NOTED POST TRANSFUSION SPECIMEN    HCT 14.6 (L) 39.0 - 52.0 %   No results  found.  Assessment: Acute/subacute GI bleed, suspect primarily an upper source with severe anemia, associated  with heavy alcohol and NSAID use Plan:  Patient has received 2 units packed red blood cells and probably needs more. Will arrange EGD today or early in the morning. Will check coags. IV Protonix. Mylei Brackeen C 12/19/2016, 10:58 AM  Pager [phone] If no answer or after 5 PM call [phone]

## 2016-12-19 NOTE — ED Provider Notes (Signed)
[facility]-EMERGENCY DEPT Provider Note   CSN: [id number] Arrival date & time: 12/18/16  2257  By signing my name below, I, Talbert Nan, attest that this documentation has been prepared under the direction and in the presence of [facility], PA-C. Electronically Signed: Talbert Nan, Scribe. 12/19/16. 12:52 AM.   History   Chief Complaint Chief Complaint  Patient presents with  . Rectal Bleeding    HPI Johnny Banks is a 56 y.o. male with h/o HTN who presents to the Emergency Department complaining of abnormal stools that began two weeks ago.  Initially he had dark black stools only but over the past 4 days has developed black stools mixed with blood. He has had upper abdominal pain.  Over the past few days has developed increased weakness, lightheadedness, SOB, nausea. Pt denies bruising or other abnormal bleeding.  Pt reports self medicating with EtOH, with several 42oz beers a day. Dr Concepcion Elk is PCP.  Denies fevers, urinary symptoms, vomiting.     The history is provided by the patient. No language interpreter was used.    Past Medical History:  Diagnosis Date  . Bipolar 1 disorder (HCC)   . Hypertension   . TBI (traumatic brain injury) [facility])     Patient Active Problem List   Diagnosis Date Noted  . Melena 12/19/2016  . Acute blood loss anemia 12/19/2016  . ETOH abuse 12/19/2016  . UGIB (upper gastrointestinal bleed) 12/19/2016  . Hypotension, unspecified 04/04/2013  . Sepsis (HCC) 04/02/2013  . Bipolar disorder (HCC) 04/02/2013  . Polysubstance abuse 04/02/2013  . HTN (hypertension) 04/02/2013  . Syncope 04/02/2013  . Acute kidney injury (HCC) 04/02/2013    Past Surgical History:  Procedure Laterality Date  . APPENDECTOMY    . HIP FRACTURE SURGERY         Home Medications    Prior to Admission medications   Medication Sig Start Date End Date Taking? Authorizing Provider  diclofenac (VOLTAREN) 75 MG EC tablet Take 75 mg by mouth 2 (two) times daily.   Yes Historical  Provider, MD  gabapentin (NEURONTIN) 300 MG capsule Take 600 mg by mouth 2 (two) times daily.    Yes Historical Provider, MD  methocarbamol (ROBAXIN) 750 MG tablet Take 750 mg by mouth 2 (two) times daily.   Yes Historical Provider, MD  PARoxetine (PAXIL) 20 MG tablet Take 20 mg by mouth daily.   Yes Historical Provider, MD  QUEtiapine (SEROQUEL) 50 MG tablet Take 50 mg by mouth daily as needed (for sleep).    Yes Historical Provider, MD    Family History Family History  Problem Relation Age of Onset  . Asthma Mother   . Heart failure Mother   . Hypertension Father   . Depression Sister   . Alcohol abuse Other     Social History Social History  Substance Use Topics  . Smoking status: Current Every Day Smoker    Packs/day: 2.00    Types: Cigarettes  . Smokeless tobacco: Never Used  . Alcohol use Yes     Comment: drinks 3 40 ounce bottles beer     Allergies   Patient has no known allergies.   Review of Systems Review of Systems  Respiratory: Positive for cough and shortness of breath.   Gastrointestinal: Positive for abdominal pain, anal bleeding, blood in stool, hematochezia and nausea.  Genitourinary: Negative for difficulty urinating and dysuria.  Neurological: Positive for dizziness, weakness and light-headedness.  All other systems reviewed and are negative.  Physical Exam Updated Vital Signs BP 102/67   Pulse 80   Temp 98 F (36.7 C)   Resp 15   Ht 6\' 1"  (1.854 m)   Wt 83.9 kg   SpO2 91%   BMI 24.41 kg/m   Physical Exam  Constitutional: He appears well-developed and well-nourished. No distress.  HENT:  Head: Normocephalic and atraumatic.  Neck: Neck supple.  Cardiovascular: Normal rate and regular rhythm.   Pulmonary/Chest: Effort normal and breath sounds normal. No respiratory distress. He has no wheezes. He has no rales.  Abdominal: Soft. He exhibits no distension and no mass. There is tenderness. There is no rebound and no guarding.  Epigastric  and RUQ tenderness.  Neurological: He is alert. He exhibits normal muscle tone.  Skin: He is not diaphoretic.  Nursing note and vitals reviewed.    ED Treatments / Results   DIAGNOSTIC STUDIES: Oxygen Saturation is 100% on room air, normal by my interpretation.    COORDINATION OF CARE: 12:39 AM Discussed treatment plan with pt at bedside and pt agreed to plan, which includes a blood transfusion.  Labs (all labs ordered are listed, but only abnormal results are displayed) Labs Reviewed  COMPREHENSIVE METABOLIC PANEL - Abnormal; Notable for the following:       Result Value   Sodium 132 (*)    CO2 17 (*)    Glucose, Bld 176 (*)    Calcium 7.5 (*)    Total Protein 4.8 (*)    Albumin 2.6 (*)    ALT 10 (*)    Total Bilirubin 0.2 (*)    All other components within normal limits  CBC - Abnormal; Notable for the following:    RBC 1.06 (*)    Hemoglobin 3.6 (*)    HCT 10.5 (*)    All other components within normal limits  URINALYSIS, ROUTINE W REFLEX MICROSCOPIC - Abnormal; Notable for the following:    APPearance HAZY (*)    Hgb urine dipstick SMALL (*)    Bacteria, UA RARE (*)    Squamous Epithelial / LPF 0-5 (*)    All other components within normal limits  POC OCCULT BLOOD, ED - Abnormal; Notable for the following:    Fecal Occult Bld POSITIVE (*)    All other components within normal limits  LIPASE, BLOOD  ETHANOL  HEMOGLOBIN AND HEMATOCRIT, BLOOD  TYPE AND SCREEN  PREPARE RBC (CROSSMATCH)  ABO/RH    EKG  EKG Interpretation None       Radiology No results found.  Procedures Procedures (including critical care time)  CRITICAL CARE Performed by: Trixie DredgeWEST, Teal Bontrager   Total critical care time: 30 minutes  Critical care time was exclusive of separately billable procedures and treating other patients.  Critical care was necessary to treat or prevent imminent or life-threatening deterioration.  Critical care was time spent personally by me on the following  activities: development of treatment plan with patient and/or surrogate as well as nursing, discussions with consultants, evaluation of patient's response to treatment, examination of patient, obtaining history from patient or surrogate, ordering and performing treatments and interventions, ordering and review of laboratory studies, ordering and review of radiographic studies, pulse oximetry and re-evaluation of patient's condition.   Medications Ordered in ED Medications  thiamine (B-1) injection 100 mg (not administered)  folic acid injection 1 mg (not administered)  pantoprazole (PROTONIX) 80 mg in sodium chloride 0.9 % 250 mL (0.32 mg/mL) infusion (8 mg/hr Intravenous New Bag/Given 12/19/16 0557)  pantoprazole (PROTONIX) injection  40 mg (not administered)  gabapentin (NEURONTIN) capsule 600 mg (not administered)  QUEtiapine (SEROQUEL) tablet 50 mg (not administered)  PARoxetine (PAXIL) tablet 20 mg (not administered)  methocarbamol (ROBAXIN) tablet 750 mg (not administered)  pantoprazole (PROTONIX) injection 40 mg (40 mg Intravenous Given 12/19/16 0042)  0.9 %  sodium chloride infusion (10 mL/hr Intravenous Restarted 12/19/16 0513)  pantoprazole (PROTONIX) 80 mg in sodium chloride 0.9 % 100 mL IVPB (0 mg Intravenous Stopped 12/19/16 0557)     Initial Impression / Assessment and Plan / ED Course  I have reviewed the triage vital signs and the nursing notes.  Pertinent labs & imaging results that were available during my care of the patient were reviewed by me and considered in my medical decision making (see chart for details).  Clinical Course as of Dec 19 716  Wed Dec 19, 2016  0315 I spoke with Dr Madilyn Fireman, [facility], regarding this patient.    [EW]    Clinical Course User Index [EW] Trixie Dredge, PA-C    Afebrile nontoxic patient with hx alcohol abuse presents with melanic and bloody stools that have progressed over 2 weeks from simple melena to melena mixed with red blood.  Nurse performed  rectal and describes dark red blood.  Upper abdominal tenderness on exam. Pt has not been vomiting.  Labs significant for Hgb 3.6.  IV protonix given, pt transfused with PRBC.  Discussed pt with GI Dr Madilyn Fireman who will consult.  Admitted to [facility], Dr Julian Reil accepting.    Final Clinical Impressions(s) / ED Diagnoses   Final diagnoses:  Gastrointestinal hemorrhage, unspecified gastrointestinal hemorrhage type  Symptomatic anemia    New Prescriptions New Prescriptions   No medications on file   I personally performed the services described in this documentation, which was scribed in my presence. The recorded information has been reviewed and is accurate.     Trixie Dredge, PA-C 12/19/16 0718    Trixie Dredge, PA-C 12/19/16 4098    Zadie Rhine, MD 12/19/16 [phone]

## 2016-12-20 ENCOUNTER — Encounter (HOSPITAL_COMMUNITY)
Admission: EM | Disposition: A | Discharge status: Skilled Nursing Facility | Payer: Self-pay | Source: Home / Self Care | Attending: Internal Medicine

## 2016-12-20 ENCOUNTER — Inpatient Hospital Stay (HOSPITAL_COMMUNITY): Payer: Medicaid Other | Admitting: Certified Registered Nurse Anesthetist

## 2016-12-20 ENCOUNTER — Encounter (HOSPITAL_COMMUNITY): Payer: Self-pay | Admitting: *Deleted

## 2016-12-20 DIAGNOSIS — Z720 Tobacco use: Secondary | ICD-10-CM

## 2016-12-20 DIAGNOSIS — D649 Anemia, unspecified: Secondary | ICD-10-CM

## 2016-12-20 DIAGNOSIS — F1010 Alcohol abuse, uncomplicated: Secondary | ICD-10-CM

## 2016-12-20 DIAGNOSIS — F319 Bipolar disorder, unspecified: Secondary | ICD-10-CM

## 2016-12-20 HISTORY — PX: ESOPHAGOGASTRODUODENOSCOPY: SHX5428

## 2016-12-20 LAB — IRON AND TIBC
IRON: 28 ug/dL — AB (ref 45–182)
Saturation Ratios: 10 % — ABNORMAL LOW (ref 17.9–39.5)
TIBC: 286 ug/dL (ref 250–450)
UIBC: 258 ug/dL

## 2016-12-20 LAB — FERRITIN: Ferritin: 30 ng/mL (ref 24–336)

## 2016-12-20 LAB — HEMOGLOBIN AND HEMATOCRIT, BLOOD
HCT: 19 % — ABNORMAL LOW (ref 39.0–52.0)
HEMOGLOBIN: 6.7 g/dL — AB (ref 13.0–17.0)

## 2016-12-20 LAB — PREPARE RBC (CROSSMATCH)

## 2016-12-20 SURGERY — EGD (ESOPHAGOGASTRODUODENOSCOPY)
Anesthesia: Monitor Anesthesia Care

## 2016-12-20 MED ORDER — LIDOCAINE 2% (20 MG/ML) 5 ML SYRINGE
INTRAMUSCULAR | Status: AC
  Filled 2016-12-20: qty 5

## 2016-12-20 MED ORDER — FENTANYL CITRATE (PF) 100 MCG/2ML IJ SOLN
INTRAMUSCULAR | Status: DC | PRN
  Administered 2016-12-20: 100 ug via INTRAVENOUS

## 2016-12-20 MED ORDER — FENTANYL CITRATE (PF) 100 MCG/2ML IJ SOLN: 25.0000 ug | INTRAMUSCULAR | Status: DC | PRN

## 2016-12-20 MED ORDER — SODIUM CHLORIDE 0.9 % IV SOLN
510.0000 mg | Freq: Once | INTRAVENOUS | Status: AC
  Administered 2016-12-20: 510 mg via INTRAVENOUS
  Filled 2016-12-20: qty 17

## 2016-12-20 MED ORDER — SODIUM CHLORIDE 0.9 % IV SOLN
Freq: Once | INTRAVENOUS | Status: AC
  Administered 2016-12-20: 11:00:00 via INTRAVENOUS

## 2016-12-20 MED ORDER — LACTATED RINGERS IV SOLN
INTRAVENOUS | Status: DC
  Administered 2016-12-20: 1000 mL via INTRAVENOUS
  Administered 2016-12-20: 21:00:00 via INTRAVENOUS

## 2016-12-20 MED ORDER — FUROSEMIDE 10 MG/ML IJ SOLN
20.0000 mg | Freq: Once | INTRAMUSCULAR | Status: AC
  Administered 2016-12-20: 20 mg via INTRAVENOUS
  Filled 2016-12-20: qty 2

## 2016-12-20 MED ORDER — FENTANYL CITRATE (PF) 100 MCG/2ML IJ SOLN
INTRAMUSCULAR | Status: AC
  Filled 2016-12-20: qty 2

## 2016-12-20 MED ORDER — SUCRALFATE 1 G PO TABS
1.0000 g | ORAL_TABLET | Freq: Four times a day (QID) | ORAL | Status: DC
  Administered 2016-12-20 – 2016-12-24 (×18): 1 g via ORAL
  Filled 2016-12-20 (×18): qty 1

## 2016-12-20 MED ORDER — LIDOCAINE 2% (20 MG/ML) 5 ML SYRINGE
INTRAMUSCULAR | Status: DC | PRN
  Administered 2016-12-20: 140 mg via INTRAVENOUS

## 2016-12-20 MED ORDER — PROPOFOL 500 MG/50ML IV EMUL
INTRAVENOUS | Status: DC | PRN
  Administered 2016-12-20: 150 ug/kg/min via INTRAVENOUS

## 2016-12-20 MED ORDER — PROMETHAZINE HCL 25 MG/ML IJ SOLN: 6.2500 mg | INTRAMUSCULAR | Status: DC | PRN

## 2016-12-20 MED ORDER — PROPOFOL 10 MG/ML IV BOLUS
INTRAVENOUS | Status: DC | PRN
  Administered 2016-12-20 (×2): 20 mg via INTRAVENOUS

## 2016-12-20 MED ORDER — SODIUM CHLORIDE 0.9 % IV SOLN: INTRAVENOUS | Status: DC

## 2016-12-20 NOTE — Progress Notes (Signed)
[facility] PROGRESS NOTE  Cherrie GauzeDavid K Oleksy ZOX:[id number] DOB: Nov 11, 1961 DOA: 12/18/2016 PCP: Dorrene GermanEdwin A Avbuere, MD  Interim summary and HPI 56 y.o. male with medical history significant of EtOH and tobacco abuse.  Patient presents to the ED with 3 week history of black tarry stool which has become bloody recently.  Last night he "felt like he was going to die".  Symptoms have been especially bad over the past 4 days.  Symptoms include lightheadedness, weakness, abd pain, nausea.  He has been self medicating with EtOH taking up to 3x 42 oz beers a day and the use of NSAID's.  Assessment/Plan: 1-UGIB: patient with melanotic stools and severely low Hgb on admission. Found to have very large duodenal ulcer on EGD. Currently no active bleeding. -risk factors includes: alcohol abuse, tobacco abuse and use of NSAID's -will continue IV PPI and carafate as per GI recommendations -will follow Hgb trend and transfuse as needed -with 2 units of PRBC's planned for today patient will have received a total of 6 units of blood. -IV iron has also been ordered  -will continue avoiding NSAID's and heparin products -patient encourage to quit drinking and smoking.  2-alcohol abuse -cessation counseling provided -will continue CIWA protocol -continue thiamine and folic acid -no signs of withdrawal appreciated   3-tobacco abuse: -cessation counseling provided -will continue nicoderm  4-bipolar disorder  -no SI or hallucinations  -will continue home medication regimen (paxil and Seroquel)  Code Status: Full Family Communication: no family at bedside  Disposition Plan: will transfer to telemetry bed (for close monitoring on CIWA protocol due to alcohol abuse); continue another 24 hours of IV PPI and advance diet as recommended by GI. Will also transfuse another 2 units of PRBC's and also give IV iron per pharmacy. Hopefully ready for discharge on 12/21/16.   Consultants:  GI (Dr.  Matthias HughsBuccini)  Procedures:  EGD 12/20/16: very large duod ulcer w/ dirty base, but there was no active bleeding  Antibiotics:  None   HPI/Subjective: Afebrile, denies CP and SOB. Patient feeling better overall and is hungry. Hemodynamically stable and with improvement in his Hgb level after transfusion.  Objective: Vitals:   12/20/16 1055 12/20/16 1100  BP: 104/66   Pulse: (!) 59   Resp: 13   Temp:  (!) 96.4 F (35.8 C)    Intake/Output Summary (Last 24 hours) at 12/20/16 1153 Last data filed at 12/20/16 1047  Gross per 24 hour  Intake          3037.25 ml  Output             3025 ml  Net            12.25 ml   Filed Weights   12/18/16 2333 12/19/16 1515  Weight: 83.9 kg (185 lb) 76.9 kg (169 lb 8.5 oz)    Exam:   General:  Afebrile, feeling ok and denying SOB, CP. No further episodes of Melena, nausea, hematemesis or any other complaints.  Cardiovascular: S1 and S2, no rubs, no gallops  Respiratory: CTA bilaterally  Abdomen: soft, no distended, positive BS, no guarding   Musculoskeletal: no edema or cyanosis   Data Reviewed: Basic Metabolic Panel:  Recent Labs Lab 12/18/16 2351  NA 132*  K 4.1  CL 104  CO2 17*  GLUCOSE 176*  BUN 18  CREATININE 0.87  CALCIUM 7.5*   Liver Function Tests:  Recent Labs Lab 12/18/16 2351  AST 25  ALT 10*  ALKPHOS 42  BILITOT 0.2*  PROT 4.8*  ALBUMIN 2.6*    Recent Labs Lab 12/18/16 2351  LIPASE 24   CBC:  Recent Labs Lab 12/18/16 2351 12/19/16 0831 12/20/16 0648  WBC 8.2  --   --   HGB 3.6* 5.1* 6.7*  HCT 10.5* 14.6* 19.0*  MCV 99.1  --   --   PLT 241  --   --     CBG: No results for input(s): GLUCAP in the last 168 hours.  Recent Results (from the past 240 hour(s))  MRSA PCR Screening     Status: None   Collection Time: 12/19/16  4:03 PM  Result Value Ref Range Status   MRSA by PCR NEGATIVE NEGATIVE Final    Comment:        The GeneXpert MRSA Assay (FDA approved for NASAL specimens only),  is one component of a comprehensive MRSA colonization surveillance program. It is not intended to diagnose MRSA infection nor to guide or monitor treatment for MRSA infections.      Studies: No results found.  Scheduled Meds: . ferumoxytol  510 mg Intravenous Once  . folic acid  1 mg Intravenous Daily  . gabapentin  600 mg Oral BID  . methocarbamol  750 mg Oral BID  . nicotine  14 mg Transdermal Daily  . [START ON 12/22/2016] pantoprazole  40 mg Intravenous Q12H  . PARoxetine  20 mg Oral Daily  . sucralfate  1 g Oral QID  . thiamine  100 mg Intravenous Daily   Continuous Infusions: . lactated ringers 1,000 mL (12/20/16 0931)  . pantoprozole (PROTONIX) infusion 8 mg/hr (12/20/16 0500)    Principal Problem:   Melena Active Problems:   Acute blood loss anemia   ETOH abuse   UGIB (upper gastrointestinal bleed)   Symptomatic anemia   Tobacco abuse    Time spent: 25 minutes    Vassie Loll  [facility] Pager [phone]. If 7PM-7AM, please contact night-coverage at [email or web address], password [facility] 12/20/2016, 11:53 AM  LOS: 1 day

## 2016-12-20 NOTE — Op Note (Signed)
[facility] Patient Name: Johnny Banks Procedure Date: 12/20/2016 MRN: [id number] Attending MD: Bernette Redbird , MD Date of Birth: 03-Sep-1961 CSN: [id number] Age: 56 Admit Type: Inpatient Procedure:                Upper GI endoscopy Indications:              Acute post hemorrhagic anemia (hgb 3.6), Melena,                            daily NSAID exposure, EtOH Providers:                Bernette Redbird, MD, Anthony Sar, RN, Kandice Robinsons, Technician Referring MD:              Medicines:                Monitored Anesthesia Care Complications:            No immediate complications. Estimated Blood Loss:     Estimated blood loss was minimal. Procedure:                Pre-Anesthesia Assessment:                           - Prior to the procedure, a History and Physical                            was performed, and patient medications and                            allergies were reviewed. The patient's tolerance of                            previous anesthesia was also reviewed. The risks                            and benefits of the procedure and the sedation                            options and risks were discussed with the patient.                            All questions were answered, and informed consent                            was obtained. Prior Anticoagulants: The patient has                            taken previous NSAID medication, last dose was 2                            days prior to procedure. ASA Grade Assessment: III                            -  A patient with severe systemic disease. After                            reviewing the risks and benefits, the patient was                            deemed in satisfactory condition to undergo the                            procedure.                           After obtaining informed consent, the endoscope was                            passed under direct vision. Throughout the                             procedure, the patient's blood pressure, pulse, and                            oxygen saturations were monitored continuously. The                            EG-[id number] ([id number]) scope was introduced through the                            mouth, and advanced to the second part of duodenum.                            The upper GI endoscopy was accomplished without                            difficulty. The patient tolerated the procedure                            well. Scope In: Scope Out: Findings:      The larynx was normal.      The examined esophagus was normal.      There is no endoscopic evidence of esophagitis, hiatal hernia, varices       or Mallory-Weiss tear in the entire esophagus.      Bilious fluid with no blood or coffee grounds was found on the greater       curvature of the stomach.      The entire examined stomach was normal.      There is no endoscopic evidence of erythema or inflammatory changes       suggestive of gastritis, inflammation, ulceration, angiodysplasia,       portal hypertension gastropathy, varices or erosion in the entire       examined stomach.      The cardia and gastric fundus were normal on retroflexion.      One minimally oozing (possibly from scope contact--no blood present in       duodenal lumen or stomach) very large, deeply-cratered duodenal ulcer       with pigmented material was found in  the first portion of the duodenum.       The lesion was 30 mm in largest dimension.      One non-bleeding much smaller, superfically cratered duodenal ulcer with       no stigmata of bleeding was found in the second portion of the duodenum.       The lesion was 6 mm in largest dimension.      Biopsies were taken with a cold forceps in the gastric antrum for       histology. Impression:               - No active bleeding or blood in the stomach at                            time of procedure.                           - Normal  larynx.                           - Normal esophagus.                           - Bilious gastric fluid.                           - Normal stomach.                           - One very large, deep duodenal ulcer with                            pigmented material and minimal oozing on contact,                            but no visible vessel or adherent clot (no                            intervention performed).                           - One small non-bleeding duodenal ulcer with no                            stigmata of bleeding.                           - Biopsies were taken with a cold forceps for                            histology. Moderate Sedation:      This patient was sedated with monitored anesthesia care, not moderate       sedation. Recommendation:           - Await pathology results. If positive for                            Helicobacter pylori, would treat with antibiotics                           -  Continue present medications including high-dose                            PPI.                           - Add sucralfate tablets 1 gram PO QID for 2 weeks                            to "patch" large ulcer.                           - Avoid NSAID's indefinitely. If usage is                            essential, co-treat with lifelong PPI prophylaxis.                            Stop smoking to reduce risk of recurrent ulcer                            formation .                           - Resume regular diet today. Procedure Code(s):        --- Professional ---                           [phone], Esophagogastroduodenoscopy, flexible,                            transoral; with biopsy, single or multiple Diagnosis Code(s):        --- Professional ---                           K26.9, Duodenal ulcer, unspecified as acute or                            chronic, without hemorrhage or perforation                           K26.4, Chronic or unspecified duodenal ulcer with                             hemorrhage                           D62, Acute posthemorrhagic anemia                           K92.1, Melena (includes Hematochezia) CPT copyright 2016 American Medical Association. All rights reserved. The codes documented in this report are preliminary and upon coder review may  be revised to meet current compliance requirements. Bernette Redbirdobert Janus Vlcek, MD 12/20/2016 10:54:26 AM This report has been signed electronically. Number of Addenda: 0

## 2016-12-20 NOTE — Progress Notes (Signed)
EGD well-tolerated--please see proc rept.  Pt has very large duod ulcer w/ dirty base, but there was no active bleeding--pt is at low-to-moderate risk for further bleeding in next 24 hrs.  Have ordered diet for pt and added sucralfate to his regimen.  Would continue Protonix infusion for the next 24 hrs so as to optimize acid control during this next critical phase; thereafter, can be transitioned to PO PPI.  Pt interested in having colonoscopy--don't think that should be done this admission, but encouraged pt to call my office to arrange.  Dischg tomorrow is possible if hgb stable and tolerates diet.  Florencia Reasonsobert V. Eshika Reckart, M.D. Pager [phone] If no answer or after 5 PM call [phone]

## 2016-12-20 NOTE — Anesthesia Preprocedure Evaluation (Signed)
Anesthesia Evaluation  Patient identified by MRN, date of birth, ID band Patient awake    Reviewed: Allergy & Precautions, NPO status , Patient's Chart, lab work & pertinent test results  Airway Mallampati: II  TM Distance: >3 FB Neck ROM: Full    Dental  (+) Teeth Intact, Dental Advisory Given   Pulmonary Current Smoker,    Pulmonary exam normal breath sounds clear to auscultation       Cardiovascular hypertension, Normal cardiovascular exam Rhythm:Regular Rate:Normal     Neuro/Psych PSYCHIATRIC DISORDERS Bipolar Disorder TBI    GI/Hepatic negative GI ROS, Neg liver ROS,   Endo/Other  negative endocrine ROS  Renal/GU Renal disease (AKI)     Musculoskeletal negative musculoskeletal ROS (+)   Abdominal   Peds  Hematology  (+) Blood dyscrasia (Hgb 6.7), anemia ,   Anesthesia Other Findings Day of surgery medications reviewed with the patient.  Reproductive/Obstetrics                             Anesthesia Physical Anesthesia Plan  ASA: II  Anesthesia Plan: MAC   Post-op Pain Management:    Induction: Intravenous  Airway Management Planned: Nasal Cannula  Additional Equipment:   Intra-op Plan:   Post-operative Plan:   Informed Consent: I have reviewed the patients History and Physical, chart, labs and discussed the procedure including the risks, benefits and alternatives for the proposed anesthesia with the patient or authorized representative who has indicated his/her understanding and acceptance.   Dental advisory given  Plan Discussed with: CRNA and Anesthesiologist  Anesthesia Plan Comments: (Discussed risks/benefits/alternatives to MAC sedation including need for ventilatory support, hypotension, need for conversion to general anesthesia.  All patient questions answered.  Patient/guardian wishes to proceed.)        Anesthesia Quick Evaluation

## 2016-12-20 NOTE — Transfer of Care (Signed)
Immediate Anesthesia Transfer of Care Note  Patient: Johnny Banks  Procedure(s) Performed: Procedure(s): ESOPHAGOGASTRODUODENOSCOPY (EGD) (N/A)  Patient Location: PACU  Anesthesia Type:MAC  Level of Consciousness: Patient easily awoken, sedated, comfortable, cooperative, following commands, responds to stimulation.   Airway & Oxygen Therapy: Patient spontaneously breathing, ventilating well, oxygen via simple oxygen mask.  Post-op Assessment: Report given to PACU RN, vital signs reviewed and stable, moving all extremities.   Post vital signs: Reviewed and stable.  Complications: No apparent anesthesia complications  Last Vitals:  Vitals:   12/20/16 0947 12/20/16 1055  BP: 118/76 104/66  Pulse:  (!) 59  Resp: 10 13  Temp: 36.7 C     Last Pain:  Vitals:   12/20/16 1055  TempSrc: Oral  PainSc:       Patients Stated Pain Goal: 0 (73/22/02 5427)  Complications: No apparent anesthesia complications

## 2016-12-20 NOTE — Progress Notes (Signed)
Addendum to previous note:  Okay for patient to be moved to regular floor bed.  Florencia Reasonsobert V. Blayre Papania, M.D. Pager [phone] If no answer or after 5 PM call [phone]

## 2016-12-20 NOTE — Progress Notes (Signed)
MEDICATION RELATED CONSULT NOTE - INITIAL   Pharmacy Consult for IV Iron  No Known Allergies  Patient Measurements: Height: 6\' 1"  (185.4 cm) Weight: 169 lb 8.5 oz (76.9 kg) IBW/kg (Calculated) : 79.9  Vital Signs: Temp: 98.6 F (37 C) (02/08 0400) Temp Source: Oral (02/08 0400) BP: 118/76 (02/08 0600) Pulse Rate: 83 (02/07 2050) Intake/Output from previous day: 02/07 0701 - 02/08 0700 In: 2292.3 [P.O.:480; I.V.:1076.3; Blood:736] Out: 3025 [Urine:3025] Intake/Output from this shift: No intake/output data recorded.  Labs:  Recent Labs  12/18/16 2351 12/19/16 0831 12/20/16 0648  WBC 8.2  --   --   HGB 3.6* 5.1* 6.7*  HCT 10.5* 14.6* 19.0*  PLT 241  --   --   CREATININE 0.87  --   --   ALBUMIN 2.6*  --   --   PROT 4.8*  --   --   AST 25  --   --   ALT 10*  --   --   ALKPHOS 42  --   --   BILITOT 0.2*  --   --    Estimated Creatinine Clearance: 101.9 mL/min (by C-G formula based on SCr of 0.87 mg/dL).  Medical History: Past Medical History:  Diagnosis Date  . Bipolar 1 disorder (HCC)   . Hypertension   . TBI (traumatic brain injury) (HCC)     Assessment: [age] yoM admitted with acute blood loss and symptomatic anemia likely due to upper GI bleed. Per MD notes, bleeding has been going on for weeks. Hgb 3.6 on admission and has improved to 6.7 following 2 units PRBC so far, 2 additional units ordered. IV iron ordered.   Plan:  Feraheme 510 mg IV x 1 today. F/u iron studies, Hgb.  Plan to administer a second dose of Feraheme in 3 days.  Clance Bollunyon, Nissan Frazzini 12/20/2016,8:05 AM

## 2016-12-20 NOTE — Care Management Note (Signed)
Case Management Note  Patient Details  Name: Johnny Banks MRN: [id number] Date of Birth: [birth year]  Subjective/Objective:     Active G.I. Bleed with anemia               Action/Plan:  Date:  December 20, 2016 Chart reviewed for concurrent status and case management needs. Will continue to follow patient progress. Discharge Planning: following for needs Expected discharge date: [id number] Marcelle SmilingRhonda Candita Borenstein, BSN, [place]3, [place]M   [phone] Expected Discharge Date:   (unknown)               Expected Discharge Plan:  Home/Self Care  In-House Referral:     Discharge planning Services     Post Acute Care Choice:    Choice offered to:     DME Arranged:    DME Agency:     HH Arranged:    HH Agency:     Status of Service:  In process, will continue to follow  If discussed at Long Length of Stay Meetings, dates discussed:    Additional Comments:  Golda AcreDavis, Trachelle Low Lynn, RN 12/20/2016, 12:01 PM

## 2016-12-20 NOTE — Anesthesia Postprocedure Evaluation (Signed)
Anesthesia Post Note  Patient: Johnny Banks  Procedure(s) Performed: Procedure(s) (LRB): ESOPHAGOGASTRODUODENOSCOPY (EGD) (N/A)  Patient location during evaluation: Endoscopy Anesthesia Type: MAC Level of consciousness: awake and alert Pain management: pain level controlled Vital Signs Assessment: post-procedure vital signs reviewed and stable Respiratory status: spontaneous breathing, nonlabored ventilation, respiratory function stable and patient connected to nasal cannula oxygen Cardiovascular status: stable and blood pressure returned to baseline Anesthetic complications: no       Last Vitals:  Vitals:   12/20/16 1055 12/20/16 1100  BP: 104/66   Pulse: (!) 59   Resp: 13   Temp:  (!) 35.8 C    Last Pain:  Vitals:   12/20/16 1100  TempSrc: Axillary  PainSc:                  Catalina Gravel

## 2016-12-21 ENCOUNTER — Encounter (HOSPITAL_COMMUNITY): Payer: Self-pay | Admitting: Gastroenterology

## 2016-12-21 DIAGNOSIS — R5381 Other malaise: Secondary | ICD-10-CM

## 2016-12-21 DIAGNOSIS — K269 Duodenal ulcer, unspecified as acute or chronic, without hemorrhage or perforation: Secondary | ICD-10-CM

## 2016-12-21 LAB — BASIC METABOLIC PANEL
ANION GAP: 7 (ref 5–15)
BUN: 15 mg/dL (ref 6–20)
CO2: 24 mmol/L (ref 22–32)
CREATININE: 0.81 mg/dL (ref 0.61–1.24)
Calcium: 7.9 mg/dL — ABNORMAL LOW (ref 8.9–10.3)
Chloride: 100 mmol/L — ABNORMAL LOW (ref 101–111)
GFR calc non Af Amer: >60 mL/min (ref >60)
GLUCOSE: 132 mg/dL — AB (ref 65–99)
Potassium: 3.4 mmol/L — ABNORMAL LOW (ref 3.5–5.1)
Sodium: 131 mmol/L — ABNORMAL LOW (ref 135–145)

## 2016-12-21 LAB — CBC
HEMATOCRIT: 25.3 % — AB (ref 39.0–52.0)
Hemoglobin: 8.8 g/dL — ABNORMAL LOW (ref 13.0–17.0)
MCH: 31.9 pg (ref 26.0–34.0)
MCHC: 34.8 g/dL (ref 30.0–36.0)
MCV: 91.7 fL (ref 78.0–100.0)
PLATELETS: 196 10*3/uL (ref 150–400)
RBC: 2.76 MIL/uL — ABNORMAL LOW (ref 4.22–5.81)
RDW: 16.8 % — AB (ref 11.5–15.5)
WBC: 6.4 10*3/uL (ref 4.0–10.5)

## 2016-12-21 LAB — SOLUBLE TRANSFERRIN RECEPTOR: TRANSFERRIN RECEPTOR: 14.7 nmol/L (ref 12.2–27.3)

## 2016-12-21 MED ORDER — PANTOPRAZOLE SODIUM 40 MG PO TBEC
40.0000 mg | DELAYED_RELEASE_TABLET | Freq: Two times a day (BID) | ORAL | Status: DC
  Administered 2016-12-21 – 2016-12-24 (×7): 40 mg via ORAL
  Filled 2016-12-21 (×7): qty 1

## 2016-12-21 MED ORDER — POLYSACCHARIDE IRON COMPLEX 150 MG PO CAPS
150.0000 mg | ORAL_CAPSULE | Freq: Every day | ORAL | Status: DC
  Administered 2016-12-21 – 2016-12-24 (×4): 150 mg via ORAL
  Filled 2016-12-21 (×4): qty 1

## 2016-12-21 MED ORDER — POTASSIUM CHLORIDE CRYS ER 20 MEQ PO TBCR
40.0000 meq | EXTENDED_RELEASE_TABLET | ORAL | Status: AC
  Administered 2016-12-21: 40 meq via ORAL
  Filled 2016-12-21 (×2): qty 2

## 2016-12-21 NOTE — Evaluation (Signed)
Physical Therapy Evaluation Patient Details Name: Johnny Banks MRN: [id number] DOB: 1961/07/16 Today's Date: 12/21/2016   History of Present Illness  56 yo male admitted with UGIB. Hx of bipolar d/o, HTN, TBI, L hip fx-awaiting surgery per pt  Clinical Impression  On eval, pt required Min assist for mobility. He walked ~20 feet with a RW. Pain rated "15" with activity. Pt presents with general weakness, decreased activity tolerance, and impaired gait and balance. Pt essentially has to be able to function and mobilize at a mod independent level in order to d/c home safely. Currently pt does not feel he can manage at home. Recommend SNF prior to return home to regain independence and safety with functional mobility prior to returning home.     Follow Up Recommendations SNF    Equipment Recommendations   (continuing to assess. Possibly TBD at next venue. )    Recommendations for Other Services       Precautions / Restrictions Precautions Precautions: Fall Restrictions Weight Bearing Restrictions: No Other Position/Activity Restrictions: pt uses TDWB-PWB status due to L hip fx      Mobility  Bed Mobility               General bed mobility comments: oob in recliner  Transfers Overall transfer level: Needs assistance Equipment used: Rolling walker (2 wheeled) Transfers: Sit to/from Stand Sit to Stand: Min assist         General transfer comment: Assist to rise, stabilzie, control descent. VCs safety, technique, hand placement. Increased time and effort to get to full standing  Ambulation/Gait Ambulation/Gait assistance: Min assist Ambulation Distance (Feet): 20 Feet Assistive device: Rolling walker (2 wheeled) Gait Pattern/deviations: Step-to pattern;Antalgic;Decreased stance time - left     General Gait Details: Externally rotated L LE. Assist to stabilize throughout ambulation. Very slow, effortful gait. Pt using TDWB-PWB status. Followed with recliner.    Stairs            Wheelchair Mobility    Modified Rankin (Stroke Patients Only)       Balance Overall balance assessment: Needs assistance;History of Falls           Standing balance-Leahy Scale: Poor Standing balance comment: required RW                             Pertinent Vitals/Pain Pain Assessment: 0-10 Pain Score: 10-Worst pain ever Faces Pain Scale: Hurts even more Pain Location: L hip with activity Pain Intervention(s): Limited activity within patient's tolerance    Home Living Family/patient expects to be discharged to:: Unsure Living Arrangements: Other (Comment) (roomate) Available Help at Discharge: Friend(s);Available PRN/intermittently Type of Home: Apartment Home Access: Stairs to enter   Entrance Stairs-Number of Steps: 2-back Home Layout: One level Home Equipment: Crutches Additional Comments: crutches not in safe condition per pt    Prior Function Level of Independence: Independent with assistive device(s)         Comments: ambulatory with crutches due to chronic L hip pain after fx     Hand Dominance        Extremity/Trunk Assessment   Upper Extremity Assessment Upper Extremity Assessment: Generalized weakness (decreased shoulder range of motion bilaterally)    Lower Extremity Assessment Lower Extremity Assessment: Generalized weakness;LLE deficits/detail LLE Deficits / Details: Knee ext at least 3/5. did not test hip strength due to pain    Cervical / Trunk Assessment Cervical / Trunk Assessment: Normal  Communication  Communication: No difficulties  Cognition Arousal/Alertness: Awake/alert Behavior During Therapy: WFL for tasks assessed/performed Overall Cognitive Status: Within Functional Limits for tasks assessed                      General Comments      Exercises     Assessment/Plan    PT Assessment Patient needs continued PT services  PT Problem List Decreased strength;Decreased  mobility;Decreased range of motion;Decreased activity tolerance;Decreased balance;Decreased knowledge of use of DME;Pain          PT Treatment Interventions DME instruction;Therapeutic activities;Gait training;Therapeutic exercise;Balance training;Functional mobility training    PT Goals (Current goals can be found in the Care Plan section)  Acute Rehab PT Goals Patient Stated Goal: less pain. hip surgery.  PT Goal Formulation: With patient Time For Goal Achievement: 01/04/17 Potential to Achieve Goals: Fair    Frequency Min 3X/week   Barriers to discharge Decreased caregiver support pt states he lives with a roomate that can assist with meals but not much else    Co-evaluation               End of Session Equipment Utilized During Treatment: Gait belt Activity Tolerance: Patient limited by pain Patient left: in chair;with call bell/phone within reach           Time: 1429-1455 PT Time Calculation (min) (ACUTE ONLY): 26 min   Charges:   PT Evaluation $PT Eval Moderate Complexity: 1 Procedure     PT G Codes:        Rebeca Alert, MPT Pager: [phone]

## 2016-12-21 NOTE — Progress Notes (Signed)
Ate a great breakfast but still has abd pn--also multiple other somatic c/o's including vertigo and inability to lift his arms above his head.  Appropriate rise in hgb from 6.7 to 8.8 following tx of 2 additional units of prc's (6 total), and BUN remains nl.  No BM's.  Bx pending.  IMPR:  1. Large DU from NSAID's w/ dirty base 2. Profound anemia (hgb 3) on adm, but no evid of bleeding since adm 3 days ago 3. Polysubstance abuse   RECOMM:  1. Ok from GI standpoint for dischg any time altho other factors may delay dischg incl severe hip problems 2. Await bx's for H. Pylori and treat If + 3. Switch to PO Protonix 4. Whenever pt goes home, I don't think he needs sucralfate but would continue bid PPI 5. Would avoid NSAID's (not sure pt will do so b/o his pain, and he's not a good candidate for narcs) so would probably keep on bid PPI forever. 6. Have made f/u ov w/ me for March 8 at 8:45 to arrange scr colonoscopy and possible f/u egd to confirm ulcer healing 7. In the meantime, pt will need to f/u w/ PCP to monitor hgb, hemoccult status, etc. 8. Will follow w/ you for at least another day if pt remains in hospital--call us if questions  Florencia Reasonsobert V. Geryl Dohn, M.D. Pager [phone] If no answer or after 5 PM call [phone]

## 2016-12-21 NOTE — Progress Notes (Signed)
[facility] PROGRESS NOTE  Cherrie GauzeDavid K Amadon ZOX:[id number] DOB: 01-10-1961 DOA: 12/18/2016 PCP: Dorrene GermanEdwin A Avbuere, MD  Interim summary and HPI 56 y.o. male with medical history significant of EtOH and tobacco abuse.  Patient presents to the ED with 3 week history of black tarry stool which has become bloody recently.  Last night he "felt like he was going to die".  Symptoms have been especially bad over the past 4 days.  Symptoms include lightheadedness, weakness, abd pain, nausea.  He has been self medicating with EtOH taking up to 3x 42 oz beers a day and the use of NSAID's.  Assessment/Plan: 1-UGIB: patient with melanotic stools and severely low Hgb on admission. Found to have very large duodenal ulcer on EGD. Currently no active bleeding. -risk factors includes: alcohol abuse, tobacco abuse and use of NSAID's -will continue IV PPI and carafate as per GI recommendations (while inpatient, at least for another 24 hours) -will follow Hgb trend and transfuse as needed -S/P 6 units of PRBC's, last 2 units given on 12/20/16 -Hgb 8.7 today. -no further signs of melena or active bleeding seen. -IV iron has also been ordered. Will now use niferex daily -will continue avoiding NSAID's and heparin products -patient encourage to quit drinking alcohol and quit smoking.  2-alcohol abuse -cessation counseling provided -will continue CIWA protocol -continue thiamine and folic acid -no signs of withdrawal appreciated   3-tobacco abuse: -cessation counseling provided -will continue nicoderm  4-bipolar disorder  -no SI or hallucinations  -will continue home medication regimen (paxil and Seroquel)  5-physical deconditioning: -will ask PT to evaluate and will follow rec's for safe discharge  Code Status: Full Family Communication: no family at bedside  Disposition Plan: will transfer to telemetry bed (for close monitoring on CIWA protocol due to alcohol abuse); continue IV PPI until tomorrow.  Assess capacity with PT. Follow Hgb trend.   Consultants:  GI (Dr. Matthias HughsBuccini)  Procedures:  EGD 12/20/16: very large duodenal ulcer w/ dirty base, but there was no active bleeding  Antibiotics:  None   HPI/Subjective: Afebrile, denies CP and SOB. Patient feeling better overall. Main complaint is feeling unsafe with current balance; also with pain in shoulders and hip.  Objective: Vitals:   12/21/16 1800 12/21/16 2033  BP: 128/80 124/81  Pulse: 70 77  Resp:  18  Temp:  98.3 F (36.8 C)    Intake/Output Summary (Last 24 hours) at 12/21/16 2204 Last data filed at 12/21/16 2129  Gross per 24 hour  Intake           2640.5 ml  Output             2500 ml  Net            140.5 ml   Filed Weights   12/18/16 2333 12/19/16 1515  Weight: 83.9 kg (185 lb) 76.9 kg (169 lb 8.5 oz)    Exam:   General:  Afebrile, feeling weak, deconditioned and with unsafe gait. Reporting pain on his shoulders and hip. No further episodes of Melena, nausea, hematemesis   Cardiovascular: S1 and S2, no rubs, no gallops  Respiratory: CTA bilaterally  Abdomen: soft, no distended, positive BS, no guarding   Musculoskeletal: no edema or cyanosis; limited ROM on his upper ext due to shoulder discomfort. Also with decrease ROM on his hip (L)  Data Reviewed: Basic Metabolic Panel:  Recent Labs Lab 12/18/16 2351 12/21/16 0547  NA 132* 131*  K 4.1 3.4*  CL 104 100*  CO2 17* 24  GLUCOSE 176* 132*  BUN 18 15  CREATININE 0.87 0.81  CALCIUM 7.5* 7.9*   Liver Function Tests:  Recent Labs Lab 12/18/16 2351  AST 25  ALT 10*  ALKPHOS 42  BILITOT 0.2*  PROT 4.8*  ALBUMIN 2.6*    Recent Labs Lab 12/18/16 2351  LIPASE 24   CBC:  Recent Labs Lab 12/18/16 2351 12/19/16 0831 12/20/16 0648 12/21/16 0547  WBC 8.2  --   --  6.4  HGB 3.6* 5.1* 6.7* 8.8*  HCT 10.5* 14.6* 19.0* 25.3*  MCV 99.1  --   --  91.7  PLT 241  --   --  196    CBG: No results for input(s): GLUCAP in the last  168 hours.  Recent Results (from the past 240 hour(s))  MRSA PCR Screening     Status: None   Collection Time: 12/19/16  4:03 PM  Result Value Ref Range Status   MRSA by PCR NEGATIVE NEGATIVE Final    Comment:        The GeneXpert MRSA Assay (FDA approved for NASAL specimens only), is one component of a comprehensive MRSA colonization surveillance program. It is not intended to diagnose MRSA infection nor to guide or monitor treatment for MRSA infections.      Studies: No results found.  Scheduled Meds: . folic acid  1 mg Intravenous Daily  . gabapentin  600 mg Oral BID  . iron polysaccharides  150 mg Oral Daily  . methocarbamol  750 mg Oral BID  . nicotine  14 mg Transdermal Daily  . pantoprazole  40 mg Oral BID AC  . PARoxetine  20 mg Oral Daily  . sucralfate  1 g Oral QID  . thiamine  100 mg Intravenous Daily   Continuous Infusions: . lactated ringers Stopped (12/21/16 0513)    Principal Problem:   Melena Active Problems:   Acute blood loss anemia   ETOH abuse   UGIB (upper gastrointestinal bleed)   Symptomatic anemia   Tobacco abuse    Time spent: 25 minutes    Vassie Loll  [facility] Pager [phone]. If 7PM-7AM, please contact night-coverage at [email or web address], password [facility] 12/21/2016, 10:04 PM  LOS: 2 days

## 2016-12-22 DIAGNOSIS — K922 Gastrointestinal hemorrhage, unspecified: Secondary | ICD-10-CM

## 2016-12-22 LAB — TYPE AND SCREEN
BLOOD PRODUCT EXPIRATION DATE: 201803032359
BLOOD PRODUCT EXPIRATION DATE: 201803032359
BLOOD PRODUCT EXPIRATION DATE: 201803032359
BLOOD PRODUCT EXPIRATION DATE: 201803042359
Blood Product Expiration Date: 201803032359
Blood Product Expiration Date: 201803032359
Blood Product Expiration Date: 201803042359
Blood Product Expiration Date: 201803042359
ISSUE DATE / TIME: 201802070105
ISSUE DATE / TIME: 201802070323
ISSUE DATE / TIME: 201802071314
ISSUE DATE / TIME: 201802071803
ISSUE DATE / TIME: 201802080913
ISSUE DATE / TIME: 201802080913
UNIT TYPE AND RH: 6200
UNIT TYPE AND RH: 6200
UNIT TYPE AND RH: 6200
Unit Type and Rh: 6200
Unit Type and Rh: 6200
Unit Type and Rh: 6200
Unit Type and Rh: 6200
Unit Type and Rh: 6200

## 2016-12-22 LAB — CBC
HCT: 25.8 % — ABNORMAL LOW (ref 39.0–52.0)
HEMOGLOBIN: 9.1 g/dL — AB (ref 13.0–17.0)
MCH: 32.7 pg (ref 26.0–34.0)
MCHC: 35.3 g/dL (ref 30.0–36.0)
MCV: 92.8 fL (ref 78.0–100.0)
PLATELETS: 224 10*3/uL (ref 150–400)
RBC: 2.78 MIL/uL — AB (ref 4.22–5.81)
RDW: 16.7 % — ABNORMAL HIGH (ref 11.5–15.5)
WBC: 5.1 10*3/uL (ref 4.0–10.5)

## 2016-12-22 LAB — BASIC METABOLIC PANEL
ANION GAP: 6 (ref 5–15)
BUN: 8 mg/dL (ref 6–20)
CALCIUM: 8 mg/dL — AB (ref 8.9–10.3)
CHLORIDE: 101 mmol/L (ref 101–111)
CO2: 25 mmol/L (ref 22–32)
Creatinine, Ser: 0.66 mg/dL (ref 0.61–1.24)
GFR calc non Af Amer: >60 mL/min (ref >60)
GLUCOSE: 104 mg/dL — AB (ref 65–99)
POTASSIUM: 3.6 mmol/L (ref 3.5–5.1)
Sodium: 132 mmol/L — ABNORMAL LOW (ref 135–145)

## 2016-12-22 LAB — VITAMIN B12: VITAMIN B 12: 277 pg/mL (ref 180–914)

## 2016-12-22 LAB — MAGNESIUM: Magnesium: 1.7 mg/dL (ref 1.7–2.4)

## 2016-12-22 LAB — PHOSPHORUS: Phosphorus: 2.9 mg/dL (ref 2.5–4.6)

## 2016-12-22 MED ORDER — FOLIC ACID 1 MG PO TABS
1.0000 mg | ORAL_TABLET | Freq: Every day | ORAL | Status: DC
  Administered 2016-12-23 – 2016-12-24 (×2): 1 mg via ORAL
  Filled 2016-12-22 (×2): qty 1

## 2016-12-22 MED ORDER — VITAMIN B-1 100 MG PO TABS
100.0000 mg | ORAL_TABLET | Freq: Every day | ORAL | Status: DC
  Administered 2016-12-23 – 2016-12-24 (×2): 100 mg via ORAL
  Filled 2016-12-22 (×2): qty 1

## 2016-12-22 NOTE — Clinical Social Work Note (Signed)
Clinical Social Work Assessment  Patient Details  Name: Johnny Banks MRN: [id number] Date of Birth: 11-29-1960  Date of referral:  12/22/16               Reason for consult:  Facility Placement                Permission sought to share information with:  Facility Art therapist granted to share information::  Yes, Verbal Permission Granted  Name::        Agency::     Relationship::     Contact Information:     Housing/Transportation Living arrangements for the past 2 months:  Single Family Home Source of Information:  Patient Patient Interpreter Needed:  None Criminal Activity/Legal Involvement Pertinent to Current Situation/Hospitalization:  No - Comment as needed Significant Relationships:  Other(Comment) (roommate & nurse friend, Magda Paganini) Lives with:  Roommate Do you feel safe going back to the place where you live?  No Need for family participation in patient care:  No (Coment)  Care giving concerns:  CSW reviewed PT evaluation recommending SNF at discharge.    Social Worker assessment / plan:  CSW met with patient re: discharge planning. Patient is agreeable with plan for SNF - states that he lives with a roommate but he is not very helpful. CSW informed patient that he would have to stay in the SNF for 30 days in order for Medicaid to cover SNF. CSW sent information out to [place] surrounding counties to try to find a Medicaid bed. CSW spoke with patient re: ETOH abuse - patient states that he decided once he was admitted to the hospital that he was going to quit cold [place] and has not had any desire while here in the hospital to drink.   Employment status:  Disabled (Comment on whether or not currently receiving Disability) Insurance information:  Medicaid In [place] PT Recommendations:  [place] / Referral to community resources:  [place]  Patient/Family's Response to care:    Patient/Family's  Understanding of and Emotional Response to Diagnosis, Current Treatment, and Prognosis:    Emotional Assessment Appearance:  Appears stated age Attitude/Demeanor/Rapport:    Affect (typically observed):    Orientation:  Oriented to Self, Oriented to Place, Oriented to  Time, Oriented to Situation Alcohol / Substance use:    Psych involvement (Current and /or in the community):     Discharge Needs  Concerns to be addressed:    Readmission within the last 30 days:    Current discharge risk:    Barriers to Discharge:      Standley Brooking, LCSW 12/22/2016, 11:36 AM

## 2016-12-22 NOTE — Clinical Social Work Placement (Signed)
Awaiting bed offers, may be difficult due to [facility] - patient aware that [facility] & that he may have to go [place]. Will follow-up with bed offers when available.    Lincoln MaxinKelly Narcisa Ganesh, LCSW [facility] Clinical Social Worker cell #: [phone]    CLINICAL SOCIAL WORK PLACEMENT  NOTE  Date:  12/22/2016  Patient Details  Name: Johnny Banks MRN: [id number] Date of Birth: May 18, 1961  Clinical Social Work is seeking post-discharge placement for this patient at the Skilled  Nursing Facility level of care (*CSW will initial, date and re-position this form in  chart as items are completed):  Yes   Patient/family provided with [facility] Clinical Social Work Department's list of facilities offering this level of care within the geographic area requested by the patient (or if unable, by the patient's family).  Yes   Patient/family informed of their freedom to choose among providers that offer the needed level of care, that participate in Medicare, Medicaid or managed care program needed by the patient, have an available bed and are willing to accept the patient.  Yes   Patient/family informed of [facility] ownership interest in [facility] and [facility], as well as of the fact that they are under no obligation to receive care at these facilities.  PASRR submitted to EDS on 12/22/16     PASRR number received on 12/22/16     Existing PASRR number confirmed on       FL2 transmitted to all facilities in geographic area requested by pt/family on 12/22/16     FL2 transmitted to all facilities within larger geographic area on       Patient informed that his/her managed care company has contracts with or will negotiate with certain facilities, including the following:            Patient/family informed of bed offers received.  Patient chooses bed at       Physician recommends and  patient chooses bed at      Patient to be transferred to   on  .  Patient to be transferred to facility by       Patient family notified on   of transfer.  Name of family member notified:        PHYSICIAN       Additional Comment:    _______________________________________________ Arlyss RepressHarrison, Rani Sisney F, LCSW 12/22/2016, 11:43 AM

## 2016-12-22 NOTE — Progress Notes (Signed)
[facility] Progress Note  Subjective: The patient is doing well today and has no specific complaints. There are no signs of active GI bleeding. His hemoglobin and hematocrit have improved. He is eating and has no abdominal pain.  Objective: Vital signs in last 24 hours: Temp:  [97.9 F (36.6 C)-98.3 F (36.8 C)] 97.9 F (36.6 C) (02/10 0507) Pulse Rate:  [68-77] 71 (02/10 0507) Resp:  [18-20] 20 (02/10 0507) BP: (115-128)/(78-81) 115/78 (02/10 0507) SpO2:  [96 %-100 %] 99 % (02/10 0507) Weight change:    PE:  No distress  Heart regular rhythm  Lungs clear  Abdomen soft and nontender  Lab Results: Results for orders placed or performed during the hospital encounter of 12/18/16 (from the past 24 hour(s))  CBC     Status: Abnormal   Collection Time: 12/22/16  5:22 AM  Result Value Ref Range   WBC 5.1 4.0 - 10.5 K/uL   RBC 2.78 (L) 4.22 - 5.81 MIL/uL   Hemoglobin 9.1 (L) 13.0 - 17.0 g/dL   HCT 96.225.8 (L) 95.239.0 - 84.152.0 %   MCV 92.8 78.0 - 100.0 fL   MCH 32.7 26.0 - 34.0 pg   MCHC 35.3 30.0 - 36.0 g/dL   RDW 32.416.7 (H) 40.111.5 - 02.715.5 %   Platelets 224 150 - 400 K/uL  Magnesium     Status: None   Collection Time: 12/22/16  5:22 AM  Result Value Ref Range   Magnesium 1.7 1.7 - 2.4 mg/dL  Basic metabolic panel     Status: Abnormal   Collection Time: 12/22/16  5:22 AM  Result Value Ref Range   Sodium 132 (L) 135 - 145 mmol/L   Potassium 3.6 3.5 - 5.1 mmol/L   Chloride 101 101 - 111 mmol/L   CO2 25 22 - 32 mmol/L   Glucose, Bld 104 (H) 65 - 99 mg/dL   BUN 8 6 - 20 mg/dL   Creatinine, Ser 2.530.66 0.61 - 1.24 mg/dL   Calcium 8.0 (L) 8.9 - 10.3 mg/dL   GFR calc non Af Amer >60 >60 mL/min   GFR calc Af Amer >60 >60 mL/min   Anion gap 6 5 - 15  Phosphorus     Status: None   Collection Time: 12/22/16  5:22 AM  Result Value Ref Range   Phosphorus 2.9 2.5 - 4.6 mg/dL  Vitamin G64B12     Status: None   Collection Time: 12/22/16  5:22 AM  Result Value Ref Range   Vitamin  B-12 277 180 - 914 pg/mL    Studies/Results: No results found.    Assessment: Duodenal ulcer  Plan:   From a GI standpoint he can be discharged. We will sign off. Patient tells me that he will be going to rehabilitation. Call us if needed.    Gwenevere AbbotSAM F Luman Holway 12/22/2016, 9:47 AM  Pager: [phone] If no answer or after 5 PM call [phone] Lab Results  Component Value Date   HGB 9.1 (L) 12/22/2016   HGB 8.8 (L) 12/21/2016   HGB 6.7 (LL) 12/20/2016   HCT 25.8 (L) 12/22/2016   HCT 25.3 (L) 12/21/2016   HCT 19.0 (L) 12/20/2016   ALKPHOS 42 12/18/2016   ALKPHOS 84 09/23/2015   ALKPHOS 46 04/03/2013   AST 25 12/18/2016   AST 40 09/23/2015   AST 13 04/03/2013   ALT 10 (L) 12/18/2016   ALT 28 09/23/2015   ALT 10 04/03/2013

## 2016-12-22 NOTE — NC FL2 (Signed)
[place] MEDICAID FL2 LEVEL OF CARE SCREENING TOOL     IDENTIFICATION  Patient Name: Johnny GauzeDavid K Marsland Birthdate: 1961-02-03 Sex: male Admission Date (Current Location): 12/18/2016  [facility] [place]   Facility and Address:  [facility],  [address], [place] [id number]      Provider Number: [id number]  Attending Physician Name and Address:  Vassie Lollarlos Madera, MD  Relative Name and Phone Number:       Current Level of Care: Hospital Recommended Level of Care: Skilled Nursing Facility Prior Approval Number:    Date Approved/Denied:   PASRR Number: [id number]  Discharge Plan: SNF    Current Diagnoses: Patient Active Problem List   Diagnosis Date Noted  . Symptomatic anemia   . Tobacco abuse   . Melena 12/19/2016  . Acute blood loss anemia 12/19/2016  . ETOH abuse 12/19/2016  . UGIB (upper gastrointestinal bleed) 12/19/2016  . Hypotension, unspecified 04/04/2013  . Sepsis (HCC) 04/02/2013  . Bipolar affective disorder (HCC) 04/02/2013  . Polysubstance abuse 04/02/2013  . HTN (hypertension) 04/02/2013  . Syncope 04/02/2013  . Acute kidney injury (HCC) 04/02/2013    Orientation RESPIRATION BLADDER Height & Weight     Time, Self, Situation, Place  Normal Continent Weight: 169 lb 8.5 oz (76.9 kg) Height:  6\' 1"  (185.4 cm)  BEHAVIORAL SYMPTOMS/MOOD NEUROLOGICAL BOWEL NUTRITION STATUS      Continent Diet (Heart)  AMBULATORY STATUS COMMUNICATION OF NEEDS Skin   Extensive Assist Verbally Normal                       Personal Care Assistance Level of Assistance  Bathing, Dressing Bathing Assistance: Limited assistance   Dressing Assistance: Limited assistance     Functional Limitations Info             SPECIAL CARE FACTORS FREQUENCY  PT (By licensed PT)     PT Frequency: 5              Contractures      Additional Factors Info  Code Status, Allergies Code Status Info: Fullcode Allergies Info: NKDA            Current Medications (12/22/2016):  This is the current hospital active medication list Current Facility-Administered Medications  Medication Dose Route Frequency Provider Last Rate Last Dose  . folic acid injection 1 mg  1 mg Intravenous Daily Hillary BowJared M Gardner, DO   1 mg at 12/21/16 1043  . gabapentin (NEURONTIN) capsule 600 mg  600 mg Oral BID Hillary BowJared M Gardner, DO   600 mg at 12/22/16 0941  . iron polysaccharides (NIFEREX) capsule 150 mg  150 mg Oral Daily Vassie Lollarlos Madera, MD   150 mg at 12/22/16 0940  . lactated ringers infusion   Intravenous Continuous Bernette Redbirdobert Buccini, MD   Stopped at 12/21/16 [phone]  . LORazepam (ATIVAN) injection 2-3 mg  2-3 mg Intravenous Q1H PRN Leanne ChangKatherine P Schorr, NP   2 mg at 12/21/16 1818  . methocarbamol (ROBAXIN) tablet 750 mg  750 mg Oral BID Hillary BowJared M Gardner, DO   750 mg at 12/22/16 0940  . nicotine (NICODERM CQ - dosed in mg/24 hours) patch 14 mg  14 mg Transdermal Daily Vassie Lollarlos Madera, MD   14 mg at 12/22/16 0940  . pantoprazole (PROTONIX) EC tablet 40 mg  40 mg Oral BID AC Bernette Redbirdobert Buccini, MD   40 mg at 12/22/16 0815  . PARoxetine (PAXIL) tablet 20 mg  20 mg Oral Daily  Hillary Bow, DO   20 mg at 12/22/16 0941  . QUEtiapine (SEROQUEL) tablet 50 mg  50 mg Oral Daily PRN Hillary Bow, DO   50 mg at 12/21/16 2222  . sucralfate (CARAFATE) tablet 1 g  1 g Oral QID Bernette Redbird, MD   1 g at 12/22/16 0940  . thiamine (B-1) injection 100 mg  100 mg Intravenous Daily Hillary Bow, DO   100 mg at 12/21/16 1042     Discharge Medications: Please see discharge summary for a list of discharge medications.  Relevant Imaging Results:  Relevant Lab Results:   Additional Information SSN: [id number]  Arlyss Repress, LCSW

## 2016-12-22 NOTE — Progress Notes (Signed)
[facility] PROGRESS NOTE  Cherrie GauzeDavid K Difabio ZOX:[id number] DOB: 29-Oct-1961 DOA: 12/18/2016 PCP: Dorrene GermanEdwin A Avbuere, MD  Interim summary and HPI 56 y.o. male with medical history significant of EtOH and tobacco abuse.  Patient presents to the ED with 3 week history of black tarry stool which has become bloody recently.  Last night he "felt like he was going to die".  Symptoms have been especially bad over the past 4 days.  Symptoms include lightheadedness, weakness, abd pain, nausea.  He has been self medicating with EtOH taking up to 3x 42 oz beers a day and the use of NSAID's.  Assessment/Plan: 1-UGIB: patient with melanotic stools and severely low Hgb on admission. Found to have very large duodenal ulcer on EGD. Currently no active bleeding. -risk factors includes: alcohol abuse, tobacco abuse and use of NSAID's -will continue PPI BID and also carafate while inpatient as per GI rec's.  -follow H. Pylori test from biopsies taken during EGD. -will follow Hgb trend and transfuse as needed -S/P 6 units of PRBC's, last 2 units given on 12/20/16 -Hgb 9.1 today. -no further signs of melena or active bleeding seen. -IV iron has also given and patient started on niferex -will continue avoiding NSAID's and heparin products -patient encourage to quit drinking alcohol and quit smoking.  2-alcohol abuse -cessation counseling provided -will continue CIWA protocol -continue thiamine and folic acid -no signs of withdrawal appreciated  -has remained very stable. Will d/c telemetry   3-tobacco abuse: -cessation counseling provided -will continue nicoderm  4-bipolar disorder  -no SI or hallucinations  -will continue home medication regimen (paxil and Seroquel)  5-physical deconditioning: -PT has evaluated patient and recommended SNF -SW consulted for placement   Code Status: Full Family Communication: no family at bedside  Disposition Plan: will transition medications to PO, continue PPI and  will follow bed availability for placement.    Consultants:  GI (Dr. Matthias HughsBuccini)  Procedures:  EGD 12/20/16: very large duodenal ulcer w/ dirty base, but there was no active bleeding  Antibiotics:  None   HPI/Subjective: Afebrile, denies CP and SOB. Patient feeling better overall. No nausea, no vomiting; deneis abd pain today and is tolerating diet.  Objective: Vitals:   12/22/16 0507 12/22/16 1413  BP: 115/78 135/86  Pulse: 71 75  Resp: 20 19  Temp: 97.9 F (36.6 C) 98.2 F (36.8 C)    Intake/Output Summary (Last 24 hours) at 12/22/16 1545 Last data filed at 12/22/16 1414  Gross per 24 hour  Intake             1440 ml  Output             2450 ml  Net            -1010 ml   Filed Weights   12/18/16 2333 12/19/16 1515  Weight: 83.9 kg (185 lb) 76.9 kg (169 lb 8.5 oz)    Exam:   General:  Afebrile, feeling weak, deconditioned, patient continue to have pain on his shoulders and hip. No further episodes of Melena, nausea or hematemesis. Tolerating diet and denying abd pain.   Cardiovascular: S1 and S2, no rubs, no gallops  Respiratory: CTA bilaterally  Abdomen: soft, no distended, positive BS, no guarding   Musculoskeletal: no edema or cyanosis; limited ROM on his upper ext due to shoulder discomfort. Also with decrease ROM on his hip (L)  Data Reviewed: Basic Metabolic Panel:  Recent Labs Lab 12/18/16 2351 12/21/16 0547 12/22/16 0522  NA 132*  131* 132*  K 4.1 3.4* 3.6  CL 104 100* 101  CO2 17* 24 25  GLUCOSE 176* 132* 104*  BUN 18 15 8   CREATININE 0.87 0.81 0.66  CALCIUM 7.5* 7.9* 8.0*  MG  --   --  1.7  PHOS  --   --  2.9   Liver Function Tests:  Recent Labs Lab 12/18/16 2351  AST 25  ALT 10*  ALKPHOS 42  BILITOT 0.2*  PROT 4.8*  ALBUMIN 2.6*    Recent Labs Lab 12/18/16 2351  LIPASE 24   CBC:  Recent Labs Lab 12/18/16 2351 12/19/16 0831 12/20/16 0648 12/21/16 0547 12/22/16 0522  WBC 8.2  --   --  6.4 5.1  HGB 3.6* 5.1* 6.7*  8.8* 9.1*  HCT 10.5* 14.6* 19.0* 25.3* 25.8*  MCV 99.1  --   --  91.7 92.8  PLT 241  --   --  196 224    CBG: No results for input(s): GLUCAP in the last 168 hours.  Recent Results (from the past 240 hour(s))  MRSA PCR Screening     Status: None   Collection Time: 12/19/16  4:03 PM  Result Value Ref Range Status   MRSA by PCR NEGATIVE NEGATIVE Final    Comment:        The GeneXpert MRSA Assay (FDA approved for NASAL specimens only), is one component of a comprehensive MRSA colonization surveillance program. It is not intended to diagnose MRSA infection nor to guide or monitor treatment for MRSA infections.      Studies: No results found.  Scheduled Meds: . folic acid  1 mg Oral Daily  . gabapentin  600 mg Oral BID  . iron polysaccharides  150 mg Oral Daily  . methocarbamol  750 mg Oral BID  . nicotine  14 mg Transdermal Daily  . pantoprazole  40 mg Oral BID AC  . PARoxetine  20 mg Oral Daily  . sucralfate  1 g Oral QID  . thiamine  100 mg Oral Daily   Continuous Infusions: . lactated ringers Stopped (12/21/16 0513)    Principal Problem:   Melena Active Problems:   Acute blood loss anemia   ETOH abuse   UGIB (upper gastrointestinal bleed)   Symptomatic anemia   Tobacco abuse    Time spent: 25 minutes    Vassie Loll  [facility] Pager [phone]. If 7PM-7AM, please contact night-coverage at [email or web address], password [facility] 12/22/2016, 3:45 PM  LOS: 3 days

## 2016-12-23 DIAGNOSIS — K264 Chronic or unspecified duodenal ulcer with hemorrhage: Principal | ICD-10-CM

## 2016-12-23 MED ORDER — VITAMIN B-12 1000 MCG PO TABS
1000.0000 ug | ORAL_TABLET | Freq: Every day | ORAL | Status: DC
  Administered 2016-12-24: 1000 ug via ORAL
  Filled 2016-12-23: qty 1

## 2016-12-23 MED ORDER — SODIUM CHLORIDE 0.9 % IV SOLN
510.0000 mg | Freq: Once | INTRAVENOUS | Status: AC
  Administered 2016-12-23: 510 mg via INTRAVENOUS
  Filled 2016-12-23: qty 17

## 2016-12-23 NOTE — Discharge Summary (Addendum)
Physician Discharge Summary  Johnny Banks JXB:[id number] DOB: July 31, 1961 DOA: 12/18/2016  PCP: Dorrene GermanEdwin A Avbuere, MD  Admit date: 12/18/2016 Discharge date: 12/24/2016  Time spent: 35 minutes  Recommendations for Outpatient Follow-up:  1. Repeat CBC in 1 week to follow Hgb trend   Discharge Diagnoses:  UGIB (upper gastrointestinal bleed) due to duodenal Ulcer Melena Acute blood loss anemia ETOH abuse Symptomatic anemia Tobacco abuse Bipolar disorder   Discharge Condition: stable and improved. Discharge to SNF for physical rehabilitation and conditioning.  Diet recommendation: regular diet   Filed Weights   12/18/16 2333 12/19/16 1515  Weight: 83.9 kg (185 lb) 76.9 kg (169 lb 8.5 oz)    History of present illness:  56 y.o.malewith medical history significant of EtOH and tobacco abuse. Patient presents to the ED with 3 week history of black tarry stool which has become bloody recently. Last night he "felt like he was going to die". Symptoms have been especially bad over the past 4 days. Symptoms include lightheadedness, weakness, abd pain, nausea. He has been self medicating with EtOH taking up to 3x 42 oz beers a day and the use of NSAID's.  Hospital Course:  1-UGIB/Duodenal Ulcer: patient with melanotic stools and severely low Hgb on admission. Found to have very large duodenal ulcer on EGD. Currently no active bleeding. -risk factors includes: alcohol abuse, tobacco abuse and use of NSAID's -will continue PPI BID and patient will follow up with GI as an outpatient to repeat EGD and to arrange for screening colonoscopy.  -follow H. Pylori test from biopsies taken during EGD (negative to time of discharge). -S/P 6 units of PRBC's, last 2 units given on 12/20/16 -last Hgb 9.5 at discharge. -repeat CBC in 1 week to follow Hgb trend  -no further signs of melena or active bleeding seen. -IV iron infusion completed during hospitalization and discharge on daily niferex -encourage to  avoid NSAID's  -patient also encouraged to quit drinking alcohol and to quit smoking.  2-alcohol abuse -cessation counseling provided -CIWA protocol used during hospitalization  -continue thiamine and folic acid -no signs of withdrawal appreciated  -remained very stable.  -B12 borderline normal, will provide maintenance supplementation.   3-tobacco abuse: -cessation counseling provided -will discharge with nicotine patch  4-bipolar disorder  -no SI or hallucinations  -will continue home medication regimen (paxil and Seroquel)  5-shoulder pain and hip pain/physical deconditioning: -PT has evaluated patient and recommended SNF -will discharge to nursing home for rehabilitation  -outpatient follow up with orthopedic service   Procedures:  EGD 12/20/16: very large duodenal ulcer w/ dirty base, but there was no active bleeding  Consultations:  GI  Discharge Exam: Vitals:   12/23/16 0631 12/23/16 1454  BP: 131/89 116/78  Pulse: 63 73  Resp: 18 18  Temp: 98.6 F (37 C) 98.9 F (37.2 C)     General:  Afebrile, no abd pain, in no distress. Patient with hip and shoulder pain and endorses feeling generally weak. No melena or hematochezia appreciated.  Cardiovascular: S1 and S2, no rubs, no gallops  Respiratory: CTA bilaterally  Abdomen: soft, no distended, positive BS, no guarding   Musculoskeletal: no edema or cyanosis; limited ROM on his upper ext due to shoulder discomfort. Also with decrease ROM on his hip (L)  Discharge Instructions    Current Discharge Medication List    START taking these medications   Details  acetaminophen (TYLENOL) 325 MG tablet Take 2 tablets (650 mg total) by mouth every 6 (six) hours as  needed for moderate pain, fever or headache.    folic acid (FOLVITE) 1 MG tablet Take 1 tablet (1 mg total) by mouth daily.    iron polysaccharides (NIFEREX) 150 MG capsule Take 1 capsule (150 mg total) by mouth daily.    nicotine (NICODERM CQ -  DOSED IN MG/24 HOURS) 14 mg/24hr patch Place 1 patch (14 mg total) onto the skin daily.    pantoprazole (PROTONIX) 40 MG tablet Take 1 tablet (40 mg total) by mouth 2 (two) times daily before a meal.    thiamine 100 MG tablet Take 1 tablet (100 mg total) by mouth daily.    vitamin B-12 1000 MCG tablet Take 1 tablet (1,000 mcg total) by mouth daily.      CONTINUE these medications which have NOT CHANGED   Details  gabapentin (NEURONTIN) 300 MG capsule Take 600 mg by mouth 2 (two) times daily.     methocarbamol (ROBAXIN) 750 MG tablet Take 750 mg by mouth 2 (two) times daily.    PARoxetine (PAXIL) 20 MG tablet Take 20 mg by mouth daily.    QUEtiapine (SEROQUEL) 50 MG tablet Take 50 mg by mouth daily as needed (for sleep).       STOP taking these medications     diclofenac (VOLTAREN) 75 MG EC tablet        No Known Allergies    The results of significant diagnostics from this hospitalization (including imaging, microbiology, ancillary and laboratory) are listed below for reference.    Significant Diagnostic Studies: No results found.  Microbiology: Recent Results (from the past 240 hour(s))  MRSA PCR Screening     Status: None   Collection Time: 12/19/16  4:03 PM  Result Value Ref Range Status   MRSA by PCR NEGATIVE NEGATIVE Final    Comment:        The GeneXpert MRSA Assay (FDA approved for NASAL specimens only), is one component of a comprehensive MRSA colonization surveillance program. It is not intended to diagnose MRSA infection nor to guide or monitor treatment for MRSA infections.      Labs: Basic Metabolic Panel:  Recent Labs Lab 12/18/16 2351 12/21/16 0547 12/22/16 0522  NA 132* 131* 132*  K 4.1 3.4* 3.6  CL 104 100* 101  CO2 17* 24 25  GLUCOSE 176* 132* 104*  BUN 18 15 8   CREATININE 0.87 0.81 0.66  CALCIUM 7.5* 7.9* 8.0*  MG  --   --  1.7  PHOS  --   --  2.9   Liver Function Tests:  Recent Labs Lab 12/18/16 2351  AST 25  ALT 10*   ALKPHOS 42  BILITOT 0.2*  PROT 4.8*  ALBUMIN 2.6*    Recent Labs Lab 12/18/16 2351  LIPASE 24   CBC:  Recent Labs Lab 12/18/16 2351 12/19/16 0831 12/20/16 0648 12/21/16 0547 12/22/16 0522 12/24/16 0504  WBC 8.2  --   --  6.4 5.1 5.3  HGB 3.6* 5.1* 6.7* 8.8* 9.1* 9.5*  HCT 10.5* 14.6* 19.0* 25.3* 25.8* 27.7*  MCV 99.1  --   --  91.7 92.8 95.2  PLT 241  --   --  196 224 304    Signed:  Vassie Loll MD.  [facility] 12/23/2016, 5:08 PM

## 2016-12-23 NOTE — Progress Notes (Signed)
Patient continues to have no bed offers.  [facility] to eval patient in the hospital on 2/12 for possible admission if patient approved.  [facility]- [facility] has denied patient for admission.  CSW will continue to follow  Burna SisJenna H. Christyann Manolis, LCSW Clinical Social Worker [phone]

## 2016-12-23 NOTE — Progress Notes (Signed)
MEDICATION RELATED CONSULT NOTE - Follow up  Pharmacy Consult for IV Iron  No Known Allergies  Patient Measurements: Height: 6\' 1"  (185.4 cm) Weight: 169 lb 8.5 oz (76.9 kg) IBW/kg (Calculated) : 79.9  Vital Signs: Temp: 98.6 F (37 C) (02/11 0631) Temp Source: Oral (02/11 0631) BP: 131/89 (02/11 0631) Pulse Rate: 63 (02/11 0631)  Labs:  Recent Labs  12/21/16 0547 12/22/16 0522  WBC 6.4 5.1  HGB 8.8* 9.1*  HCT 25.3* 25.8*  PLT 196 224  CREATININE 0.81 0.66  MG  --  1.7  PHOS  --  2.9   Estimated Creatinine Clearance: 110.8 mL/min (by C-G formula based on SCr of 0.66 mg/dL).  Assessment: [age] yoM admitted with acute blood loss and symptomatic anemia likely due to upper GI bleed. Per MD notes, bleeding has been going on for weeks. Hgb 3.6 on admission, now improving with PRBC transfusion.  Pharmacy is consulted for IV iron replacement.  Iron/TIBC/Ferritin/ %Sat    Component Value Date/Time   IRON 28 (L) 12/20/2016 0900   TIBC 286 12/20/2016 0900   FERRITIN 30 12/20/2016 0900   IRONPCTSAT 10 (L) 12/20/2016 0900   First Feraheme 510mg  IV dose given on 2/8 Hgb 9.1 (2/10 labs, last transfusion on 2/8)  Plan:   Feraheme 510 mg IV x 1 today.  This will complete the second and final dose of IV iron.  Continue Niferex PO iron per MD orders.  Pharmacy will sign off.  Please re-consult as needed.  Lynann Beaverhristine Izzabella Besse PharmD, BCPS Pager [phone] 12/23/2016 9:19 AM

## 2016-12-23 NOTE — Progress Notes (Signed)
[facility] PROGRESS NOTE  STEEL KERNEY ZOX:[id number] DOB: 17-Nov-1960 DOA: 12/18/2016 PCP: Dorrene German, MD  Interim summary and HPI 56 y.o. male with medical history significant of EtOH and tobacco abuse.  Patient presents to the ED with 3 week history of black tarry stool which has become bloody recently.  Last night he "felt like he was going to die".  Symptoms have been especially bad over the past 4 days.  Symptoms include lightheadedness, weakness, abd pain, nausea.  He has been self medicating with EtOH taking up to 3x 42 oz beers a day and the use of NSAID's.  Assessment/Plan: 1-UGIB: patient with melanotic stools and severely low Hgb on admission. Found to have very large duodenal ulcer on EGD. Currently no active bleeding. -risk factors includes: alcohol abuse, tobacco abuse and use of NSAID's -will continue PPI BID and also carafate while inpatient as per GI rec's.  -follow H. Pylori test from biopsies taken during EGD. -will follow Hgb trend and transfuse as needed -S/P 6 units of PRBC's, last 2 units given on 12/20/16 -last Hgb 9.1 on 2/10. -repeat CBC in am -no further signs of melena or active bleeding seen. -IV iron infusion completed. Will continue daily niferex -will continue avoiding NSAID's and heparin products -patient encourage to quit drinking alcohol and quit smoking.  2-alcohol abuse -cessation counseling provided -will continue CIWA protocol -continue thiamine and folic acid -no signs of withdrawal appreciated  -has remained very stable. Will d/c telemetry   3-tobacco abuse: -cessation counseling provided -will continue nicoderm  4-bipolar disorder  -no SI or hallucinations  -will continue home medication regimen (paxil and Seroquel)  5-physical deconditioning: -PT has evaluated patient and recommended SNF -SW consulted for placement   Code Status: Full Family Communication: no family at bedside  Disposition Plan: will continue PO PPI, start  B12 and continue niferex. Follow bed availability for placement.    Consultants:  GI (Dr. Matthias Hughs)  Procedures:  EGD 12/20/16: very large duodenal ulcer w/ dirty base, but there was no active bleeding  Antibiotics:  None   HPI/Subjective: Afebrile, denies CP and SOB. Patient feeling better overall. No nausea, no vomiting; deneis abd pain today and is tolerating diet w/o problems.  Objective: Vitals:   12/23/16 0631 12/23/16 1454  BP: 131/89 116/78  Pulse: 63 73  Resp: 18 18  Temp: 98.6 F (37 C) 98.9 F (37.2 C)    Intake/Output Summary (Last 24 hours) at 12/23/16 1648 Last data filed at 12/23/16 0630  Gross per 24 hour  Intake             1200 ml  Output             3200 ml  Net            -2000 ml   Filed Weights   12/18/16 2333 12/19/16 1515  Weight: 83.9 kg (185 lb) 76.9 kg (169 lb 8.5 oz)    Exam:   General:  Afebrile, no abd pain, in no distress. Patient with hip and shoulder pain and endorses feeling generally weak. No melena or hematochezia appreciated.  Cardiovascular: S1 and S2, no rubs, no gallops  Respiratory: CTA bilaterally  Abdomen: soft, no distended, positive BS, no guarding   Musculoskeletal: no edema or cyanosis; limited ROM on his upper ext due to shoulder discomfort. Also with decrease ROM on his hip (L)  Data Reviewed: Basic Metabolic Panel:  Recent Labs Lab 12/18/16 2351 12/21/16 0547 12/22/16 0522  NA 132*  131* 132*  K 4.1 3.4* 3.6  CL 104 100* 101  CO2 17* 24 25  GLUCOSE 176* 132* 104*  BUN 18 15 8   CREATININE 0.87 0.81 0.66  CALCIUM 7.5* 7.9* 8.0*  MG  --   --  1.7  PHOS  --   --  2.9   Liver Function Tests:  Recent Labs Lab 12/18/16 2351  AST 25  ALT 10*  ALKPHOS 42  BILITOT 0.2*  PROT 4.8*  ALBUMIN 2.6*    Recent Labs Lab 12/18/16 2351  LIPASE 24   CBC:  Recent Labs Lab 12/18/16 2351 12/19/16 0831 12/20/16 0648 12/21/16 0547 12/22/16 0522  WBC 8.2  --   --  6.4 5.1  HGB 3.6* 5.1* 6.7* 8.8*  9.1*  HCT 10.5* 14.6* 19.0* 25.3* 25.8*  MCV 99.1  --   --  91.7 92.8  PLT 241  --   --  196 224    CBG: No results for input(s): GLUCAP in the last 168 hours.  Recent Results (from the past 240 hour(s))  MRSA PCR Screening     Status: None   Collection Time: 12/19/16  4:03 PM  Result Value Ref Range Status   MRSA by PCR NEGATIVE NEGATIVE Final    Comment:        The GeneXpert MRSA Assay (FDA approved for NASAL specimens only), is one component of a comprehensive MRSA colonization surveillance program. It is not intended to diagnose MRSA infection nor to guide or monitor treatment for MRSA infections.      Studies: No results found.  Scheduled Meds: . folic acid  1 mg Oral Daily  . gabapentin  600 mg Oral BID  . iron polysaccharides  150 mg Oral Daily  . methocarbamol  750 mg Oral BID  . nicotine  14 mg Transdermal Daily  . pantoprazole  40 mg Oral BID AC  . PARoxetine  20 mg Oral Daily  . sucralfate  1 g Oral QID  . thiamine  100 mg Oral Daily  . vitamin B-12  1,000 mcg Oral Daily   Continuous Infusions: . lactated ringers Stopped (12/21/16 0513)    Principal Problem:   Melena Active Problems:   Acute blood loss anemia   ETOH abuse   UGIB (upper gastrointestinal bleed)   Symptomatic anemia   Tobacco abuse    Time spent: 25 minutes    Vassie LollMadera, Newel Oien  [facility] Pager [phone]. If 7PM-7AM, please contact night-coverage at [email or web address], password [email or web address] 12/23/2016, 4:48 PM  LOS: 4 days

## 2016-12-24 DIAGNOSIS — R5381 Other malaise: Secondary | ICD-10-CM

## 2016-12-24 LAB — CBC
HEMATOCRIT: 27.7 % — AB (ref 39.0–52.0)
Hemoglobin: 9.5 g/dL — ABNORMAL LOW (ref 13.0–17.0)
MCH: 32.6 pg (ref 26.0–34.0)
MCHC: 34.3 g/dL (ref 30.0–36.0)
MCV: 95.2 fL (ref 78.0–100.0)
Platelets: 304 10*3/uL (ref 150–400)
RBC: 2.91 MIL/uL — ABNORMAL LOW (ref 4.22–5.81)
RDW: 16.8 % — AB (ref 11.5–15.5)
WBC: 5.3 10*3/uL (ref 4.0–10.5)

## 2016-12-24 MED ORDER — FOLIC ACID 1 MG PO TABS: 1.0000 mg | ORAL_TABLET | Freq: Every day | ORAL | Status: AC

## 2016-12-24 MED ORDER — PANTOPRAZOLE SODIUM 40 MG PO TBEC: 40.0000 mg | DELAYED_RELEASE_TABLET | Freq: Two times a day (BID) | ORAL | Status: DC

## 2016-12-24 MED ORDER — CYANOCOBALAMIN 1000 MCG PO TABS: 1000.0000 ug | ORAL_TABLET | Freq: Every day | ORAL | Status: AC

## 2016-12-24 MED ORDER — NICOTINE 14 MG/24HR TD PT24: 14.0000 mg | MEDICATED_PATCH | Freq: Every day | TRANSDERMAL | Status: DC

## 2016-12-24 MED ORDER — POLYSACCHARIDE IRON COMPLEX 150 MG PO CAPS: 150.0000 mg | ORAL_CAPSULE | Freq: Every day | ORAL | Status: AC

## 2016-12-24 MED ORDER — THIAMINE HCL 100 MG PO TABS: 100.0000 mg | ORAL_TABLET | Freq: Every day | ORAL | Status: AC

## 2016-12-24 MED ORDER — ACETAMINOPHEN 325 MG PO TABS: 650.0000 mg | ORAL_TABLET | Freq: Four times a day (QID) | ORAL | Status: DC | PRN

## 2016-12-24 NOTE — Clinical Social Work Note (Signed)
[facility] once medically stable for discharge.   Derenda FennelBashira Conchita Truxillo, MSW [phone] 12/24/2016 11:41 AM

## 2016-12-24 NOTE — Clinical Social Work Placement (Signed)
Medical Social Worker facilitated patient discharge including contacting patient family and facility to confirm patient discharge plans.  Clinical information faxed to facility and family agreeable with plan.  MSW [facility]  RN to call report prior to discharge.  Medical Social Worker will sign off for now as social work intervention is no longer needed. Please consult us again if new need arises.  CLINICAL SOCIAL WORK PLACEMENT  NOTE  Date:  12/24/2016  Patient Details  Name: Johnny Banks MRN: [id number] Date of Birth: Jan 17, 1961  Clinical Social Work is seeking post-discharge placement for this patient at the Skilled  Nursing Facility level of care (*CSW will initial, date and re-position this form in  chart as items are completed):  Yes   Patient/family provided with  Clinical Social Work Department's list of facilities offering this level of care within the geographic area requested by the patient (or if unable, by the patient's family).  Yes   Patient/family informed of their freedom to choose among providers that offer the needed level of care, that participate in Medicare, Medicaid or managed care program needed by the patient, have an available bed and are willing to accept the patient.  Yes   Patient/family informed of 's ownership interest in [facility] and [facility], as well as of the fact that they are under no obligation to receive care at these facilities.  PASRR submitted to EDS on 12/22/16     PASRR number received on 12/22/16     Existing PASRR number confirmed on       FL2 transmitted to all facilities in geographic area requested by pt/family on 12/22/16     FL2 transmitted to all facilities within larger geographic area on       Patient informed that his/her managed care company has contracts with or will negotiate with certain facilities, including the following:         Yes   Patient/family informed of bed offers received.  [facility]     Physician recommends and patient chooses bed at      Patient to be transferred to  [facility] ) on 12/24/16.  Patient to be transferred to facility by  [facility] )     Patient family notified on 12/24/16 of transfer.  Name of family member notified:   (Pt to notify family/friend)     PHYSICIAN Please sign FL2, Please prepare priority discharge summary, including medications     Additional Comment:    _______________________________________________ Derenda FennelNixon, Miko Markwood A 12/24/2016, 2:24 PM

## 2016-12-24 NOTE — Progress Notes (Signed)
LCSW received call from MD regarding patient placement and status. Call placed to liaison for SNF: [facility] in reference to coming to see patient. Tammy will be here in the next 20 minutes to evaluate patient. Reviewed medical record with liaison.  Plan: Tentative plan: SNF pending offers.  Will follow up once referrals have been reviewed.  Will follow up.  Deretha EmoryHannah Jamika Sadek LCSW, MSW Clinical Social Work: Optician, dispensingystem [employer] Coverage for :  [phone]

## 2016-12-25 ENCOUNTER — Encounter: Payer: Self-pay | Admitting: Internal Medicine

## 2016-12-25 ENCOUNTER — Non-Acute Institutional Stay (SKILLED_NURSING_FACILITY): Payer: Medicaid Other | Admitting: Internal Medicine

## 2016-12-25 DIAGNOSIS — G4700 Insomnia, unspecified: Secondary | ICD-10-CM

## 2016-12-25 DIAGNOSIS — M25512 Pain in left shoulder: Secondary | ICD-10-CM

## 2016-12-25 DIAGNOSIS — Z720 Tobacco use: Secondary | ICD-10-CM

## 2016-12-25 DIAGNOSIS — M65339 Trigger finger, unspecified middle finger: Secondary | ICD-10-CM | POA: Diagnosis not present

## 2016-12-25 DIAGNOSIS — F319 Bipolar disorder, unspecified: Secondary | ICD-10-CM | POA: Diagnosis not present

## 2016-12-25 DIAGNOSIS — M25551 Pain in right hip: Secondary | ICD-10-CM | POA: Diagnosis not present

## 2016-12-25 DIAGNOSIS — M25552 Pain in left hip: Secondary | ICD-10-CM | POA: Diagnosis not present

## 2016-12-25 DIAGNOSIS — R5381 Other malaise: Secondary | ICD-10-CM | POA: Diagnosis not present

## 2016-12-25 DIAGNOSIS — M25511 Pain in right shoulder: Secondary | ICD-10-CM

## 2016-12-25 DIAGNOSIS — D649 Anemia, unspecified: Secondary | ICD-10-CM

## 2016-12-25 DIAGNOSIS — F1010 Alcohol abuse, uncomplicated: Secondary | ICD-10-CM

## 2016-12-25 NOTE — Progress Notes (Signed)
Patient ID: Johnny Banks, male   DOB: 1961-08-01, 56 y.o.   MRN: [id number]    HISTORY AND PHYSICAL   DATE: 12/25/2016  Location:    [place] Room Number: 158 A Place of Service: SNF (31)   Extended Emergency Contact Information Primary Emergency Contact: Bass,Leslie  [place] Phone: [phone] Relation: Other  Advanced Directive information Does Patient Have a Medical Advance Directive?: No, Would patient like information on creating a medical advance directive?: No - Patient declined  Chief Complaint  Patient presents with  . New Admit To SNF    HPI:  56 yo male seen today as a new admission into SNF following hospital stay for melena, acute blood loss anemia, Etoh abuse, UGIB, symptomatic anemia, physical deconditioning, tobacco abuse, bipolar d/o. He presented to the ED with 3 week hx black tarry stools. He increased Etoh use to 3x 42 oz beers per day with NSAID. GI consulted. EGD revealed very large duodenal ulcer with dirty base but no active bleeding. Gastric bx results revealed marked chronic minimally active gastritis; Neg H pylori;  No intestinal metaplasia/dysplasia/malignanacy. He was given IV iron and Etoh abuse counseling. Hgb 3.6--> transfused 6 units PRBCs -->9.5; albumin 2.6; Etoh <5; FOBT (+); Cr 0.66; Na 132 at d/c. He presents to SNF for short term rehab.  Today he reports insomnia and chronic pain in multiple joints but especially L>R hip. He has seen Ortho Marolyn Hammock in the past and would like to f/u again. He was told he needs sx to repair. He has tried trazodone in the past with success. He reports hx psych d/o and has taken seroquel. He is not interested in returning to his apt to live and would like to s/w SW about changing residences. He reports he quit drinking since recent hospital admission but still has desire to smoke. He is wearing nicotine patch. No further bloody stool or melena. Still has abdominal  discomfort.  Bipolar d/o/ hx TBI - mood stable on paxil, prn seroquel.   Chronic tobacco abuse - has nicotine patch  Etoh abuse - gets thiamine, B12 and folate supplement. He was given Etoh cessation counseling prior to hospital d/c.  Chronic pain syndrome - stable on neurontin  Past Medical History:  Diagnosis Date  . Bipolar 1 disorder ([place])   . Hypertension   . TBI (traumatic brain injury) [facility])     Past Surgical History:  Procedure Laterality Date  . APPENDECTOMY    . ESOPHAGOGASTRODUODENOSCOPY N/A 12/20/2016   Procedure: ESOPHAGOGASTRODUODENOSCOPY (EGD);  Surgeon: Ronald Lobo, MD;  Location: [facility];  Service: Endoscopy;  Laterality: N/A;  . HIP FRACTURE SURGERY      Patient Care Team: Nolene Ebbs, MD as PCP - General (Internal Medicine)  Social History   Social History  . Marital status: Single    Spouse name: N/A  . Number of children: N/A  . Years of education: N/A   Occupational History  . Not on file.   Social History Main Topics  . Smoking status: Current Every Day Smoker    Packs/day: 2.00    Types: Cigarettes  . Smokeless tobacco: Never Used  . Alcohol use Yes     Comment: drinks 3 40 ounce bottles beer  . Drug use: Yes    Types: Marijuana     Comment: 2-3 times a week  . Sexual activity: Not on file   Other Topics Concern  . Not on file   Social History  Narrative  . No narrative on file     reports that he has been smoking Cigarettes.  He has been smoking about 2.00 packs per day. He has never used smokeless tobacco. He reports that he drinks alcohol. He reports that he uses drugs, including Marijuana.  Family History  Problem Relation Age of Onset  . Asthma Mother   . Heart failure Mother   . Hypertension Father   . Depression Sister   . Alcohol abuse Other    Family Status  Relation Status  . Mother Deceased  . Father Deceased  . Sister Alive  . Brother Alive  . Other     Immunization History  Administered Date(s)  Administered  . Pneumococcal Polysaccharide-23 04/03/2013  . Tdap 08/02/2016    No Known Allergies  Medications: Patient's Medications  New Prescriptions   No medications on file  Previous Medications   ACETAMINOPHEN (TYLENOL) 325 MG TABLET    Take 2 tablets (650 mg total) by mouth every 6 (six) hours as needed for moderate pain, fever or headache.   FOLIC ACID (FOLVITE) 1 MG TABLET    Take 1 tablet (1 mg total) by mouth daily.   GABAPENTIN (NEURONTIN) 300 MG CAPSULE    Take 600 mg by mouth 2 (two) times daily.    IRON POLYSACCHARIDES (NIFEREX) 150 MG CAPSULE    Take 1 capsule (150 mg total) by mouth daily.   METHOCARBAMOL (ROBAXIN) 750 MG TABLET    Take 750 mg by mouth 2 (two) times daily.   NICOTINE (NICODERM CQ - DOSED IN MG/24 HOURS) 14 MG/24HR PATCH    Place 1 patch (14 mg total) onto the skin daily.   PANTOPRAZOLE (PROTONIX) 40 MG TABLET    Take 1 tablet (40 mg total) by mouth 2 (two) times daily before a meal.   PAROXETINE (PAXIL) 20 MG TABLET    Take 20 mg by mouth daily.   QUETIAPINE (SEROQUEL) 50 MG TABLET    Take 50 mg by mouth daily as needed (for sleep).    THIAMINE 100 MG TABLET    Take 1 tablet (100 mg total) by mouth daily.   VITAMIN B-12 1000 MCG TABLET    Take 1 tablet (1,000 mcg total) by mouth daily.  Modified Medications   No medications on file  Discontinued Medications   No medications on file    Review of Systems  Unable to perform ROS: Psychiatric disorder    Vitals:   12/25/16 1007  BP: 118/78  Pulse: 91  Resp: 18  Temp: 97.5 F (36.4 C)  TempSrc: Oral  SpO2: 96%   There is no height or weight on file to calculate BMI.  Physical Exam  Constitutional: He appears well-developed and well-nourished.  Looks uncomfortable, frail appearing in NAD, sitting on edge of bed. Nicotine patch 21 mg/hr on left arm  HENT:  Mouth/Throat: Oropharynx is clear and moist.  Eyes: Pupils are equal, round, and reactive to light. No scleral icterus.  Neck: Neck  supple. Carotid bruit is not present. No thyromegaly present.  Cardiovascular: Normal rate, regular rhythm, normal heart sounds and intact distal pulses.  Exam reveals no gallop and no friction rub.   No murmur heard. no distal LE swelling. No calf TTP  Pulmonary/Chest: Effort normal. He has wheezes (occasional end expiratory). He has no rales. He exhibits no tenderness.  Abdominal: Soft. Bowel sounds are normal. He exhibits no distension, no abdominal bruit, no pulsatile midline mass and no mass. There is no hepatomegaly.  There is tenderness (generally). There is no rebound and no guarding.  Musculoskeletal: He exhibits edema, tenderness and deformity (left hand trigger finger with PIP joint swelling).  Lymphadenopathy:    He has no cervical adenopathy.  Neurological: He is alert. Gait (antalgic) abnormal.  Skin: Skin is warm and dry. No rash noted.  Psychiatric: He has a normal mood and affect. Judgment and thought content normal. His mood appears not anxious. His speech is rapid and/or pressured. He is agitated.     Labs reviewed: Admission on 12/18/2016, Discharged on 12/24/2016  Component Date Value Ref Range Status  . Sodium 12/18/2016 132* 135 - 145 mmol/L Final  . Potassium 12/18/2016 4.1  3.5 - 5.1 mmol/L Final  . Chloride 12/18/2016 104  101 - 111 mmol/L Final  . CO2 12/18/2016 17* 22 - 32 mmol/L Final  . Glucose, Bld 12/18/2016 176* 65 - 99 mg/dL Final  . BUN 12/18/2016 18  6 - 20 mg/dL Final  . Creatinine, Ser 12/18/2016 0.87  0.61 - 1.24 mg/dL Final  . Calcium 12/18/2016 7.5* 8.9 - 10.3 mg/dL Final  . Total Protein 12/18/2016 4.8* 6.5 - 8.1 g/dL Final  . Albumin 12/18/2016 2.6* 3.5 - 5.0 g/dL Final  . AST 12/18/2016 25  15 - 41 U/L Final  . ALT 12/18/2016 10* 17 - 63 U/L Final  . Alkaline Phosphatase 12/18/2016 42  38 - 126 U/L Final  . Total Bilirubin 12/18/2016 0.2* 0.3 - 1.2 mg/dL Final  . GFR calc non Af Amer 12/18/2016 >60  >60 mL/min Final  . GFR calc Af Amer  12/18/2016 >60  >60 mL/min Final   Comment: (NOTE) The eGFR has been calculated using the CKD EPI equation. This calculation has not been validated in all clinical situations. eGFR's persistently <60 mL/min signify possible Chronic Kidney Disease.   . Anion gap 12/18/2016 11  5 - 15 Final  . WBC 12/18/2016 8.2  4.0 - 10.5 K/uL Final  . RBC 12/18/2016 1.06* 4.22 - 5.81 MIL/uL Final  . Hemoglobin 12/18/2016 3.6* 13.0 - 17.0 g/dL Final   Comment: REPEATED TO VERIFY CRITICAL RESULT CALLED TO, READ BACK BY AND VERIFIED WITH: MARQUEZ,A RN 2.7.18 '@0029'$  ZANDO,C   . HCT 12/18/2016 10.5* 39.0 - 52.0 % Final  . MCV 12/18/2016 99.1  78.0 - 100.0 fL Final  . MCH 12/18/2016 34.0  26.0 - 34.0 pg Final  . MCHC 12/18/2016 34.3  30.0 - 36.0 g/dL Final  . RDW 12/18/2016 13.9  11.5 - 15.5 % Final  . Platelets 12/18/2016 241  150 - 400 K/uL Final  . ISSUE DATE / TIME 12/18/2016 [id number]   Final  . Blood Product Unit Number 12/18/2016 [id number]   Final  . PRODUCT CODE 12/18/2016 [id number]   Final  . Unit Type and Rh 12/18/2016 6200   Final  . Blood Product Expiration Date 12/18/2016 [id number]   Final  . ISSUE DATE / TIME 12/18/2016 [id number]   Final  . Blood Product Unit Number 12/18/2016 [id number]   Final  . PRODUCT CODE 12/18/2016 [id number]   Final  . Unit Type and Rh 12/18/2016 6200   Final  . Blood Product Expiration Date 12/18/2016 [id number]   Final  . ISSUE DATE / TIME 12/18/2016 [id number]   Final  . Blood Product Unit Number 12/18/2016 [id number]   Final  . PRODUCT CODE 12/18/2016 [id number]   Final  . Unit Type and Rh 12/18/2016 6200   Final  . Blood Product Expiration Date 12/18/2016 [id number]  Final  . ISSUE DATE / TIME 12/18/2016 [id number]   Final  . Blood Product Unit Number 12/18/2016 [id number]   Final  . PRODUCT CODE 12/18/2016 [id number]   Final  . Unit Type and Rh 12/18/2016 6200   Final  . Blood Product Expiration Date 12/18/2016 [id number]   Final   . ISSUE DATE / TIME 12/18/2016 [id number]   Final  . Blood Product Unit Number 12/18/2016 [id number]   Final  . PRODUCT CODE 12/18/2016 [id number]   Final  . Unit Type and Rh 12/18/2016 6200   Final  . Blood Product Expiration Date 12/18/2016 [id number]   Final  . ISSUE DATE / TIME 12/18/2016 [id number]   Final  . Blood Product Unit Number 12/18/2016 [id number]   Final  . PRODUCT CODE 12/18/2016 [id number]   Final  . Unit Type and Rh 12/18/2016 6200   Final  . Blood Product Expiration Date 12/18/2016 [id number]   Final  . Blood Product Unit Number 12/18/2016 [id number]   Final  . Unit Type and Rh 12/18/2016 6200   Final  . Blood Product Expiration Date 12/18/2016 [id number]   Final  . Blood Product Unit Number 12/18/2016 [id number]   Final  . Unit Type and Rh 12/18/2016 6200   Final  . Blood Product Expiration Date 12/18/2016 [id number]   Final  . Fecal Occult Bld 12/18/2016 POSITIVE* NEGATIVE Final  . Lipase 12/18/2016 24  11 - 51 U/L Final  . Color, Urine 12/19/2016 YELLOW  YELLOW Final  . APPearance 12/19/2016 HAZY* CLEAR Final  . Specific Gravity, Urine 12/19/2016 1.016  1.005 - 1.030 Final  . pH 12/19/2016 5.0  5.0 - 8.0 Final  . Glucose, UA 12/19/2016 NEGATIVE  NEGATIVE mg/dL Final  . Hgb urine dipstick 12/19/2016 SMALL* NEGATIVE Final  . Bilirubin Urine 12/19/2016 NEGATIVE  NEGATIVE Final  . Ketones, ur 12/19/2016 NEGATIVE  NEGATIVE mg/dL Final  . Protein, ur 12/19/2016 NEGATIVE  NEGATIVE mg/dL Final  . Nitrite 12/19/2016 NEGATIVE  NEGATIVE Final  . Leukocytes, UA 12/19/2016 NEGATIVE  NEGATIVE Final  . RBC / HPF 12/19/2016 0-5  0 - 5 RBC/hpf Final  . WBC, UA 12/19/2016 0-5  0 - 5 WBC/hpf Final  . Bacteria, UA 12/19/2016 RARE* NONE SEEN Final  . Squamous Epithelial / LPF 12/19/2016 0-5* NONE SEEN Final  . Mucous 12/19/2016 PRESENT   Final  . Hyaline Casts, UA 12/19/2016 PRESENT   Final  . Alcohol, Ethyl (B) 12/18/2016 <5  <5 mg/dL Final   Comment:         LOWEST DETECTABLE LIMIT FOR SERUM ALCOHOL IS 5 mg/dL FOR MEDICAL PURPOSES ONLY   . Order Confirmation 12/19/2016 ORDER PROCESSED BY BLOOD BANK   Final  . ABO/RH(D) 12/18/2016 A POS   Final  . Hemoglobin 12/19/2016 5.1* 13.0 - 17.0 g/dL Final   Comment: CRITICAL RESULT CALLED TO, READ BACK BY AND VERIFIED WITH: STETTLER,H. RN '@0856'$  ON 2.7.18 BY NMCCOY DELTA CHECK NOTED POST TRANSFUSION SPECIMEN   . HCT 12/19/2016 14.6* 39.0 - 52.0 % Final  . Prothrombin Time 12/19/2016 16.6* 11.4 - 15.2 seconds Final  . INR 12/19/2016 1.34   Final  . Order Confirmation 12/19/2016 ORDER PROCESSED BY BLOOD BANK   Final  . MRSA by PCR 12/19/2016 NEGATIVE  NEGATIVE Final   Comment:        The GeneXpert MRSA Assay (FDA approved for NASAL specimens only), is one component of a comprehensive MRSA colonization surveillance program. It is not intended to diagnose MRSA infection nor to  guide or monitor treatment for MRSA infections.   . Hemoglobin 12/20/2016 6.7* 13.0 - 17.0 g/dL Final   Comment: DELTA CHECK NOTED POST TRANSFUSION SPECIMEN CRITICAL RESULT CALLED TO, READ BACK BY AND VERIFIED WITH: BROOKS,K. RN '@0717'$  ON 2.8.18 BY NMCCOY   . HCT 12/20/2016 19.0* 39.0 - 52.0 % Final  . Order Confirmation 12/20/2016 ORDER PROCESSED BY BLOOD BANK   Final  . Iron 12/20/2016 28* 45 - 182 ug/dL Final  . TIBC 12/20/2016 286  250 - 450 ug/dL Final  . Saturation Ratios 12/20/2016 10* 17.9 - 39.5 % Final  . UIBC 12/20/2016 258  ug/dL Final  . Ferritin 12/20/2016 30  24 - 336 ng/mL Final  . Transferrin Receptor 12/20/2016 14.7  12.2 - 27.3 nmol/L Final   Comment: (NOTE) Performed At: [facility] [place], [place] [id number] Lindon Romp MD IP:[id number]   . WBC 12/21/2016 6.4  4.0 - 10.5 K/uL Final  . RBC 12/21/2016 2.76* 4.22 - 5.81 MIL/uL Final  . Hemoglobin 12/21/2016 8.8* 13.0 - 17.0 g/dL Final   Comment: DELTA CHECK NOTED REPEATED TO VERIFY POST TRANSFUSION SPECIMEN   .  HCT 12/21/2016 25.3* 39.0 - 52.0 % Final  . MCV 12/21/2016 91.7  78.0 - 100.0 fL Final  . MCH 12/21/2016 31.9  26.0 - 34.0 pg Final  . MCHC 12/21/2016 34.8  30.0 - 36.0 g/dL Final  . RDW 12/21/2016 16.8* 11.5 - 15.5 % Final  . Platelets 12/21/2016 196  150 - 400 K/uL Final  . Sodium 12/21/2016 131* 135 - 145 mmol/L Final  . Potassium 12/21/2016 3.4* 3.5 - 5.1 mmol/L Final  . Chloride 12/21/2016 100* 101 - 111 mmol/L Final  . CO2 12/21/2016 24  22 - 32 mmol/L Final  . Glucose, Bld 12/21/2016 132* 65 - 99 mg/dL Final  . BUN 12/21/2016 15  6 - 20 mg/dL Final  . Creatinine, Ser 12/21/2016 0.81  0.61 - 1.24 mg/dL Final  . Calcium 12/21/2016 7.9* 8.9 - 10.3 mg/dL Final  . GFR calc non Af Amer 12/21/2016 >60  >60 mL/min Final  . GFR calc Af Amer 12/21/2016 >60  >60 mL/min Final   Comment: (NOTE) The eGFR has been calculated using the CKD EPI equation. This calculation has not been validated in all clinical situations. eGFR's persistently <60 mL/min signify possible Chronic Kidney Disease.   . Anion gap 12/21/2016 7  5 - 15 Final  . WBC 12/22/2016 5.1  4.0 - 10.5 K/uL Final  . RBC 12/22/2016 2.78* 4.22 - 5.81 MIL/uL Final  . Hemoglobin 12/22/2016 9.1* 13.0 - 17.0 g/dL Final  . HCT 12/22/2016 25.8* 39.0 - 52.0 % Final  . MCV 12/22/2016 92.8  78.0 - 100.0 fL Final  . MCH 12/22/2016 32.7  26.0 - 34.0 pg Final  . MCHC 12/22/2016 35.3  30.0 - 36.0 g/dL Final  . RDW 12/22/2016 16.7* 11.5 - 15.5 % Final  . Platelets 12/22/2016 224  150 - 400 K/uL Final  . Magnesium 12/22/2016 1.7  1.7 - 2.4 mg/dL Final  . Sodium 12/22/2016 132* 135 - 145 mmol/L Final  . Potassium 12/22/2016 3.6  3.5 - 5.1 mmol/L Final  . Chloride 12/22/2016 101  101 - 111 mmol/L Final  . CO2 12/22/2016 25  22 - 32 mmol/L Final  . Glucose, Bld 12/22/2016 104* 65 - 99 mg/dL Final  . BUN 12/22/2016 8  6 - 20 mg/dL Final  . Creatinine, Ser 12/22/2016 0.66  0.61 - 1.24 mg/dL Final  .  Calcium 12/22/2016 8.0* 8.9 - 10.3 mg/dL Final   . GFR calc non Af Amer 12/22/2016 >60  >60 mL/min Final  . GFR calc Af Amer 12/22/2016 >60  >60 mL/min Final   Comment: (NOTE) The eGFR has been calculated using the CKD EPI equation. This calculation has not been validated in all clinical situations. eGFR's persistently <60 mL/min signify possible Chronic Kidney Disease.   . Anion gap 12/22/2016 6  5 - 15 Final  . Phosphorus 12/22/2016 2.9  2.5 - 4.6 mg/dL Final  . Vitamin B-12 12/22/2016 277  180 - 914 pg/mL Final   Comment: (NOTE) This assay is not validated for testing neonatal or myeloproliferative syndrome specimens for Vitamin B12 levels. Performed at [facility], [place] [address] [place], [place] [address]   . WBC 12/24/2016 5.3  4.0 - 10.5 K/uL Final  . RBC 12/24/2016 2.91* 4.22 - 5.81 MIL/uL Final  . Hemoglobin 12/24/2016 9.5* 13.0 - 17.0 g/dL Final  . HCT 12/24/2016 27.7* 39.0 - 52.0 % Final  . MCV 12/24/2016 95.2  78.0 - 100.0 fL Final  . MCH 12/24/2016 32.6  26.0 - 34.0 pg Final  . MCHC 12/24/2016 34.3  30.0 - 36.0 g/dL Final  . RDW 12/24/2016 16.8* 11.5 - 15.5 % Final  . Platelets 12/24/2016 304  150 - 400 K/uL Final    No results found.   Assessment/Plan   ICD-9-CM ICD-10-CM   1. Pain of both hip joints 719.45 M25.551     M25.552   2. Pain of both shoulder joints 719.41 M25.511     M25.512   3. Trigger middle finger, unspecified laterality 727.03 M65.339   4. Insomnia, unspecified type 780.52 G47.00   5. Tobacco abuse 305.1 Z72.0   6. ETOH abuse 305.00 F10.10   7. Bipolar affective disorder, remission status unspecified ([place]) 296.80 F31.9   8. Physical deconditioning 799.3 R53.81   9. Anemia, unspecified type 285.9 D64.9    due to acute blood loss   Repeat CBC and BMP in 1 week  Tylenol '650mg'$  po BID for pain. D/c prn Tylenol  Trazodone 50 mg po qhs for insomnia  F/u with GI to have repeat EGD and a screening colonoscopy  Refer to Ortho for b/l hip pain, shoulder pain and trigger  finger  Cont current meds as ordered  discussed Etoh cessation  PT/Ot/ST as ordered  GOAL: short term rehab and d/c home when medically appropriate. Communicated with pt and nursing.  Will follow  Sachin Ferencz S. Perlie Gold  [facility] [address] [place], [place] [address] [phone] Cell (Monday-Friday 8 AM - 5 PM) [phone] After 5 PM and follow prompts

## 2016-12-31 LAB — CBC AND DIFFERENTIAL
HCT: 31 % — AB (ref 41–53)
HEMOGLOBIN: 10 g/dL — AB (ref 13.5–17.5)
NEUTROS ABS: 3 /uL
PLATELETS: 343 10*3/uL (ref 150–399)
WBC: 4.3 10*3/mL

## 2016-12-31 LAB — BASIC METABOLIC PANEL
BUN: 15 mg/dL (ref 4–21)
Creatinine: 0.7 mg/dL (ref 0.6–1.3)
Glucose: 91 mg/dL
Potassium: 4.9 mmol/L (ref 3.4–5.3)
Sodium: 140 mmol/L (ref 137–147)

## 2017-01-04 ENCOUNTER — Ambulatory Visit (INDEPENDENT_AMBULATORY_CARE_PROVIDER_SITE_OTHER): Payer: Self-pay

## 2017-01-04 ENCOUNTER — Encounter (INDEPENDENT_AMBULATORY_CARE_PROVIDER_SITE_OTHER): Payer: Self-pay | Admitting: Orthopedic Surgery

## 2017-01-04 ENCOUNTER — Ambulatory Visit (INDEPENDENT_AMBULATORY_CARE_PROVIDER_SITE_OTHER): Payer: Medicaid Other | Admitting: Orthopedic Surgery

## 2017-01-04 ENCOUNTER — Ambulatory Visit (INDEPENDENT_AMBULATORY_CARE_PROVIDER_SITE_OTHER): Payer: Medicaid Other

## 2017-01-04 DIAGNOSIS — G8929 Other chronic pain: Secondary | ICD-10-CM

## 2017-01-04 DIAGNOSIS — M25511 Pain in right shoulder: Secondary | ICD-10-CM | POA: Diagnosis not present

## 2017-01-04 DIAGNOSIS — M25512 Pain in left shoulder: Secondary | ICD-10-CM | POA: Diagnosis not present

## 2017-01-04 NOTE — Progress Notes (Signed)
Office Visit Note   Patient: Johnny Banks           Date of Birth: 1961/02/24           MRN: [id number] Visit Date: 01/04/2017 Requested by: Fleet ContrasEdwin Avbuere, MD [address] [place], [place] [id number] PCP: Dorrene GermanEdwin A Avbuere, MD  Subjective: Chief Complaint  Patient presents with  . Right Shoulder - Pain  . Left Shoulder - Pain    HPI Johnny HuaDavid is a 56 year old patient with bilateral shoulder pain.  He day she has bilateral shoulder rotator cuff tears.  This was self diagnosed.  He's had multiple falls on his left arm and shoulder most recently but both have been involved in the past.  These falls at times been related to his alcohol use.  He did fall in the shower on the right shoulder many years ago.  He has seen Dr. Deno Etiennehu years ago and was told that he needs bilateral hip replacements.  He states that he is rating get that done.  Unfortunately he was just discharged from the hospital from bleeding gastric ulcer at which time his hemoglobin by his report went down to 3.  He is still recovering from that episode.  He is in a wheelchair.  He is in a nursing home.  He states that he has not had anything to drink for about 3 or 4 weeks.  He is also a smoker.  In addition to that he has a known history of chronic ear MRSA infection which he was told would require 4 hour surgery for eradication.  Additionally he has not seen a dentist in a long time.              Review of Systems All systems reviewed are negative as they relate to the chief complaint within the history of present illness.  Patient denies  fevers or chills.    Assessment & Plan: Visit Diagnoses:  1. Chronic right shoulder pain   2. Chronic left shoulder pain     Plan: Impression is bilateral shoulder rotator cuff arthropathy with fairly well compensated right shoulder which has forward flexion and abduction above 90.  The left shoulder however is much less well compensated with anterior escape and forward flexion and abduction  and only about 25-30.  He will require surgical treatment of these at some time in the future particularly the left shoulder.  I think the right shoulder is pretty functional at this time and I would not recommend any further intervention on the right shoulder.  However he has significant other problems that he is to deal with first.  #1 he needs to recover from this episode where his hemoglobin went down to 3 from his gastric bleeding.  #2 he needs to have his year MRSA infection addressed.  #3 he needs to see the dentist for tooth extraction.  #4 after numbers 1 through 3 have been done he could see Dr. Deno Etiennehu for consideration of hip replacement.  I think his medical comorbidities are significant.  After hip replacement he could consider getting the left shoulder replaced with a reverse shoulder replacement.  That needs to be done after his hip replacements just to avoid the loading that would be required superiorly on that left shoulder following hip replacement surgery.  I will see him back if and when he decides to have all of this taken care of.  Follow-Up Instructions: No Follow-up on file.   Orders:  Orders Placed This Encounter  Procedures  . XR Shoulder Right  . XR Shoulder Left   No orders of the defined types were placed in this encounter.     Procedures: No procedures performed   Clinical Data: No additional findings.  Objective: Vital Signs: There were no vitals taken for this visit.  Physical Exam   Constitutional: Patient appears well-developed HEENT:  Head: Normocephalic Eyes:EOM are normal Neck: Normal range of motion Cardiovascular: Normal rate Pulmonary/chest: Effort normal Neurologic: Patient is alert Skin: Skin is warm Psychiatric: Patient has normal mood and affect    Ortho Exam orthopedic exam demonstrates pretty reasonable range of motion active and passive of the right shoulder.  Neck range of motion is intact.  Motor sensory function to the hands are  intact.  He has forward flexion actively and abduction active above 90 on the right.  He does have external rotation weakness on the right-hand side.  No other masses lymph adenopathy or skin changes noted in the right shoulder region.  On the left shoulder he only has about 25 of forward flexion and abduction.  Does have anterior superior escape with attempted forward flexion.  Fairly debilitated in terms of functional range of motion of that left shoulder.  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  He is in a wheelchair.  He does have some groin pain with internal/external rotation of each leg.  Specialty Comments:  No specialty comments available.  Imaging: No results found.   PMFS History: Patient Active Problem List   Diagnosis Date Noted  . Chronic right shoulder pain 01/04/2017  . Physical deconditioning   . Symptomatic anemia   . Tobacco abuse   . Melena 12/19/2016  . Acute blood loss anemia 12/19/2016  . ETOH abuse 12/19/2016  . UGIB (upper gastrointestinal bleed) 12/19/2016  . Hypotension, unspecified 04/04/2013  . Sepsis (HCC) 04/02/2013  . Bipolar affective disorder (HCC) 04/02/2013  . Polysubstance abuse 04/02/2013  . HTN (hypertension) 04/02/2013  . Syncope 04/02/2013  . Acute kidney injury (HCC) 04/02/2013   Past Medical History:  Diagnosis Date  . Bipolar 1 disorder (HCC)   . Hypertension   . TBI (traumatic brain injury) (HCC)     Family History  Problem Relation Age of Onset  . Asthma Mother   . Heart failure Mother   . Hypertension Father   . Depression Sister   . Alcohol abuse Other     Past Surgical History:  Procedure Laterality Date  . APPENDECTOMY    . ESOPHAGOGASTRODUODENOSCOPY N/A 12/20/2016   Procedure: ESOPHAGOGASTRODUODENOSCOPY (EGD);  Surgeon: Bernette Redbird, MD;  Location: [facility];  Service: Endoscopy;  Laterality: N/A;  . HIP FRACTURE SURGERY      Social History   Occupational History  . Not on file.   Social History Main Topics  . Smoking status: Current Every Day Smoker    Packs/day: 2.00    Types: Cigarettes  . Smokeless tobacco: Never Used  . Alcohol use Yes     Comment: drinks 3 40 ounce bottles beer  . Drug use: Yes    Types: Marijuana     Comment: 2-3 times a week  . Sexual activity: Not on file

## 2017-01-17 ENCOUNTER — Non-Acute Institutional Stay (SKILLED_NURSING_FACILITY): Payer: Medicaid Other | Admitting: Adult Health

## 2017-01-17 ENCOUNTER — Encounter: Payer: Self-pay | Admitting: Adult Health

## 2017-01-17 DIAGNOSIS — G8929 Other chronic pain: Secondary | ICD-10-CM

## 2017-01-17 DIAGNOSIS — M25512 Pain in left shoulder: Secondary | ICD-10-CM | POA: Diagnosis not present

## 2017-01-17 DIAGNOSIS — M25511 Pain in right shoulder: Secondary | ICD-10-CM | POA: Diagnosis not present

## 2017-01-17 DIAGNOSIS — F3161 Bipolar disorder, current episode mixed, mild: Secondary | ICD-10-CM | POA: Diagnosis not present

## 2017-01-17 NOTE — Progress Notes (Signed)
Location:   [facility] Room Number: 158 A Place of Service:  SNF (31)   CODE STATUS: Full Code  No Known Allergies  Chief Complaint  Patient presents with  . Acute Visit    Neck and shoulder pain    HPI:  He is complaining of pain that is not being managed in his bilateral shoulders and neck. He states that the pain is worse in his left shoulder. He tells me that he wants oxycodone and valium every 4 hours. We did discuss his pain medication regimen. He tells me tha that he is anxious as well.    Past Medical History:  Diagnosis Date  . Bipolar 1 disorder (HCC)   . Hypertension   . TBI (traumatic brain injury) [facility](HCC)     Past Surgical History:  Procedure Laterality Date  . APPENDECTOMY    . ESOPHAGOGASTRODUODENOSCOPY N/A 12/20/2016   Procedure: ESOPHAGOGASTRODUODENOSCOPY (EGD);  Surgeon: Bernette Redbirdobert Buccini, MD;  [facility];  Service: Endoscopy;  Laterality: N/A;  . HIP FRACTURE SURGERY      Social History   Social History  . Marital status: Single    Spouse name: N/A  . Number of children: N/A  . Years of education: N/A   Occupational History  . Not on file.   Social History Main Topics  . Smoking status: Current Every Day Smoker    Packs/day: 2.00    Types: Cigarettes  . Smokeless tobacco: Never Used  . Alcohol use Yes     Comment: drinks 3 40 ounce bottles beer  . Drug use: Yes    Types: Marijuana     Comment: 2-3 times a week  . Sexual activity: Not on file   Other Topics Concern  . Not on file   Social History Narrative  . No narrative on file   Family History  Problem Relation Age of Onset  . Asthma Mother   . Heart failure Mother   . Hypertension Father   . Depression Sister   . Alcohol abuse Other       VITAL SIGNS BP 135/77   Pulse 96   Temp 97.9 F (36.6 C)   Resp 19   Ht 6\' 1"  (1.854 m)   Wt 184 lb 6.4 oz (83.6 kg)   SpO2 95%   BMI 24.33 kg/m   Patient's Medications  New Prescriptions   No  medications on file  Previous Medications   ACETAMINOPHEN (TYLENOL) 325 MG TABLET    Take 2 tablets (650 mg total) by mouth every 6 (six) hours as needed for moderate pain, fever or headache.   DIAZEPAM (VALIUM) 5 MG TABLET    Take 5 mg by mouth every 12 (twelve) hours as needed for anxiety.   FOLIC ACID (FOLVITE) 1 MG TABLET    Take 1 tablet (1 mg total) by mouth daily.   GABAPENTIN (NEURONTIN) 300 MG CAPSULE    Take 600 mg by mouth 2 (two) times daily.    IRON POLYSACCHARIDES (NIFEREX) 150 MG CAPSULE    Take 1 capsule (150 mg total) by mouth daily.   METHOCARBAMOL (ROBAXIN) 750 MG TABLET    Take 750 mg by mouth 2 (two) times daily.   PANTOPRAZOLE (PROTONIX) 40 MG TABLET    Take 1 tablet (40 mg total) by mouth 2 (two) times daily before a meal.   PAROXETINE (PAXIL) 20 MG TABLET    Take 20 mg by mouth daily.   THIAMINE 100 MG TABLET  Take 1 tablet (100 mg total) by mouth daily.   TRAZODONE (DESYREL) 50 MG TABLET    Take 50 mg by mouth at bedtime.   VARENICLINE (CHANTIX) 0.5 MG TABLET    Take 0.5 mg by mouth 2 (two) times daily. For 4 days   VARENICLINE (CHANTIX) 1 MG TABLET    Take 1 mg by mouth 2 (two) times daily.   VITAMIN B-12 1000 MCG TABLET    Take 1 tablet (1,000 mcg total) by mouth daily.  Modified Medications   No medications on file  Discontinued Medications   NICOTINE (NICODERM CQ - DOSED IN MG/24 HOURS) 14 MG/24HR PATCH    Place 1 patch (14 mg total) onto the skin daily.   QUETIAPINE (SEROQUEL) 50 MG TABLET    Take 50 mg by mouth daily as needed (for sleep).      SIGNIFICANT DIAGNOSTIC EXAMS  LABS REVIEWED:   12-22-16: wbc 5.1; hgb 9.1; hct 25.8; mcv 92.; plt 224; glucose 104; bun 8; creat 0.66; k+ 3.6; na++ 132; ca 8.0; phos 2.9; vit B 12: 277   Review of Systems  Unable to perform ROS: Psychiatric disorder    Physical Exam  Constitutional: No distress.  Eyes: Conjunctivae are normal.  Neck: Neck supple. No JVD present. No thyromegaly present.  Cardiovascular:  Normal rate, regular rhythm and intact distal pulses.   Respiratory: Effort normal and breath sounds normal. No respiratory distress. He has no wheezes.  GI: Soft. Bowel sounds are normal. He exhibits no distension. There is no tenderness.  Musculoskeletal: He exhibits no edema.  Able to move all extremities  Left shoulder range of motion better than right   Lymphadenopathy:    He has no cervical adenopathy.  Neurological: He is alert.  Skin: Skin is warm and dry. He is not diaphoretic.  Psychiatric: He has a normal mood and affect.      ASSESSMENT/ PLAN:  1. Neck and bilateral shoulder pain 2. Anxiety/bipolar disease  Will change to robaxin 750- mg four times daily Will increase paxil to 40 mg daily  Will increase neurontin to 600 mg three times daily  Will increase tylenol to 1 gm three times daily  Will check bmp in one week  MD is aware of resident's narcotic use and is in agreement with current plan of care. We will attempt to wean resident as apropriate   Synthia Innocent NP [facility]  Contact [phone] Monday through Friday 8am- 5pm  After hours call [phone]

## 2017-01-21 ENCOUNTER — Encounter: Payer: Self-pay | Admitting: Adult Health

## 2017-01-21 NOTE — Progress Notes (Signed)
Opened in error

## 2017-01-24 ENCOUNTER — Non-Acute Institutional Stay (SKILLED_NURSING_FACILITY): Payer: Medicaid Other | Admitting: Adult Health

## 2017-01-24 ENCOUNTER — Encounter: Payer: Self-pay | Admitting: Adult Health

## 2017-01-24 DIAGNOSIS — D649 Anemia, unspecified: Secondary | ICD-10-CM | POA: Diagnosis not present

## 2017-01-24 DIAGNOSIS — F1910 Other psychoactive substance abuse, uncomplicated: Secondary | ICD-10-CM | POA: Diagnosis not present

## 2017-01-24 DIAGNOSIS — R5381 Other malaise: Secondary | ICD-10-CM

## 2017-01-24 DIAGNOSIS — K922 Gastrointestinal hemorrhage, unspecified: Secondary | ICD-10-CM

## 2017-01-24 NOTE — Progress Notes (Signed)
Location:   [facility] Room Number: 158A Place of Service:  SNF (31)    CODE STATUS: Full Code  No Known Allergies  Chief Complaint  Patient presents with  . Discharge Note    Discharge    HPI:  He is being discharged to home with home health for rn. He will need a a front wheel walker. He will need his prescriptions written and will need to follow up with his medical provider.    Past Medical History:  Diagnosis Date  . Bipolar 1 disorder (HCC)   . Hypertension   . TBI (traumatic brain injury) [facility])     Past Surgical History:  Procedure Laterality Date  . APPENDECTOMY    . ESOPHAGOGASTRODUODENOSCOPY N/A 12/20/2016   Procedure: ESOPHAGOGASTRODUODENOSCOPY (EGD);  Surgeon: Bernette Redbird, MD;  Location: [facility];  Service: Endoscopy;  Laterality: N/A;  . HIP FRACTURE SURGERY      Social History   Social History  . Marital status: Single    Spouse name: N/A  . Number of children: N/A  . Years of education: N/A   Occupational History  . Not on file.   Social History Main Topics  . Smoking status: Current Every Day Smoker    Packs/day: 2.00    Types: Cigarettes  . Smokeless tobacco: Never Used  . Alcohol use Yes     Comment: drinks 3 40 ounce bottles beer  . Drug use: Yes    Types: Marijuana     Comment: 2-3 times a week  . Sexual activity: Not on file   Other Topics Concern  . Not on file   Social History Narrative  . No narrative on file   Family History  Problem Relation Age of Onset  . Asthma Mother   . Heart failure Mother   . Hypertension Father   . Depression Sister   . Alcohol abuse Other     VITAL SIGNS BP 135/77   Pulse 96   Temp 97.9 F (36.6 C)   Resp 19   Ht 6\' 1"  (1.854 m)   Wt 184 lb 6.4 oz (83.6 kg)   SpO2 95%   BMI 24.33 kg/m   Patient's Medications  New Prescriptions   No medications on file  Previous Medications   ACETAMINOPHEN (TYLENOL) 500 MG TABLET    Take 1,000 mg by mouth every 8 (eight)  hours as needed.   AMINO ACIDS-PROTEIN HYDROLYS (FEEDING SUPPLEMENT, PRO-STAT SUGAR FREE 64,) LIQD    Take 30 mLs by mouth 3 (three) times daily with meals.   DIAZEPAM (VALIUM) 5 MG TABLET    Take 5 mg by mouth every 12 (twelve) hours as needed for anxiety.   FOLIC ACID (FOLVITE) 1 MG TABLET    Take 1 tablet (1 mg total) by mouth daily.   GABAPENTIN (NEURONTIN) 300 MG CAPSULE    Take 600 mg by mouth 3 (three) times daily.    IRON POLYSACCHARIDES (NIFEREX) 150 MG CAPSULE    Take 1 capsule (150 mg total) by mouth daily.   METHOCARBAMOL (ROBAXIN) 750 MG TABLET    Take 750 mg by mouth 4 (four) times daily.   PANTOPRAZOLE (PROTONIX) 40 MG TABLET    Take 40 mg by mouth daily.   PAROXETINE (PAXIL) 40 MG TABLET    Take 40 mg by mouth daily.   THIAMINE 100 MG TABLET    Take 1 tablet (100 mg total) by mouth daily.   TRAZODONE (DESYREL) 50 MG TABLET  Take 50 mg by mouth at bedtime.   VARENICLINE (CHANTIX) 1 MG TABLET    Take 1 mg by mouth 2 (two) times daily.   VITAMIN B-12 1000 MCG TABLET    Take 1 tablet (1,000 mcg total) by mouth daily.  Modified Medications   No medications on file  Discontinued Medications   No medications on file     SIGNIFICANT DIAGNOSTIC EXAMS    LABS REVIEWED:   12-22-16: wbc 5.1; hgb 9.1; hct 25.8; mcv 92.; plt 224; glucose 104; bun 8; creat 0.66; k+ 3.6; na++ 132; ca 8.0; phos 2.9; vit B 12: 277   Review of Systems  Unable to perform ROS: Psychiatric disorder    Physical Exam  Constitutional: No distress.  Eyes: Conjunctivae are normal.  Neck: Neck supple. No JVD present. No thyromegaly present.  Cardiovascular: Normal rate, regular rhythm and intact distal pulses.   Respiratory: Effort normal and breath sounds normal. No respiratory distress. He has no wheezes.  GI: Soft. Bowel sounds are normal. He exhibits no distension. There is no tenderness.  Musculoskeletal: He exhibits no edema.  Able to move all extremities  Left shoulder range of motion better  than right   Lymphadenopathy:    He has no cervical adenopathy.  Neurological: He is alert.  Skin: Skin is warm and dry. He is not diaphoretic.  Psychiatric: He has a normal mood and affect.    ASSESSMENT/ PLAN:  Patient is being discharged with the following home health services:  rn to evaluate and treat as indicated for medication management   Patient is being discharged with the following durable medical equipment:  Front wheel walker in order to maintain his current level of independence with his adls.   Patient has been advised to f/u with their PCP in 1-2 weeks to bring them up to date on their rehab stay.  Social services at facility was responsible for arranging this appointment.  Pt was provided with a 30 day supply of prescriptions for medications and refills must be obtained from their PCP.  For controlled substances, a more limited supply may be provided adequate until PCP appointment only.#10 valium 2 mg tabs     Time spent with patient  45  minutes >50% time spent counseling; reviewing medical record; tests; labs; and developing future plan of care    Synthia InnocentDeborah Green NP [facility]  Contact [phone] Monday through Friday 8am- 5pm  After hours call [phone]

## 2018-02-06 ENCOUNTER — Ambulatory Visit (INDEPENDENT_AMBULATORY_CARE_PROVIDER_SITE_OTHER): Payer: Medicaid Other

## 2018-02-06 ENCOUNTER — Ambulatory Visit (INDEPENDENT_AMBULATORY_CARE_PROVIDER_SITE_OTHER): Payer: Medicaid Other | Admitting: Orthopaedic Surgery

## 2018-02-06 ENCOUNTER — Encounter (INDEPENDENT_AMBULATORY_CARE_PROVIDER_SITE_OTHER): Payer: Self-pay | Admitting: Orthopaedic Surgery

## 2018-02-06 DIAGNOSIS — M1612 Unilateral primary osteoarthritis, left hip: Secondary | ICD-10-CM

## 2018-02-06 DIAGNOSIS — M25552 Pain in left hip: Secondary | ICD-10-CM

## 2018-02-06 DIAGNOSIS — M1611 Unilateral primary osteoarthritis, right hip: Secondary | ICD-10-CM

## 2018-02-06 NOTE — Progress Notes (Signed)
Office Visit Note   Patient: Cherrie GauzeDavid K Fountain           Date of Birth: [birth year]           MRN: [id number] Visit Date: 02/06/2018              Requested by: Fleet ContrasAvbuere, Edwin, MD [address] [place] [id number] PCP: Fleet ContrasAvbuere, Edwin, MD   Assessment & Plan: Visit Diagnoses:  1. Primary osteoarthritis of left hip   2. Left hip pain   3. Primary osteoarthritis of right hip     Plan: Impression is severe bilateral hip degenerative joint disease worse on the left.  Left femoral head demonstrates resorption and protrusion of the pins into the acetabulum.  At this point I feel that anything short of a total hip replacement would not give him the pain relief that he needs.  He understands that a total hip replacement is the only reasonable solution.  He understands the risk benefits alternatives of surgery and he understands and wished to proceed.  Patient denies history of DVT.  Patient is in a stable housing situation currently and I feel that it is appropriate to proceed with surgery at this point.  We discussed the importance of being compliant with postoperative rehab and recommendations.  Questions encouraged and answered. Total face to face encounter time was greater than 25 minutes and over half of this time was spent in counseling and/or coordination of care.  Follow-Up Instructions: No follow-ups on file.   Orders:  Orders Placed This Encounter  Procedures  . XR HIP UNILAT W OR W/O PELVIS 2-3 VIEWS LEFT   No orders of the defined types were placed in this encounter.     Procedures: No procedures performed   Clinical Data: No additional findings.   Subjective: Chief Complaint  Patient presents with  . Left Hip - Pain, Follow-up    Patient is a 57 year old gentleman who lives in family care and [place] who comes in with severe left hip.  I previously saw him for this in 2015.  He had SCFE done in the 1970s for both of his hips.  He is subsequently develop  degenerative joint disease that is worse on the left.  He walks with a walker secondary to severe pain.  He does have a history of prior alcohol and drug use but he has been clean.  He smokes less than a pack of cigarettes a day.  He has severe difficulty with ADLs and chronic pain that is severe   Review of Systems  Constitutional: Negative.   All other systems reviewed and are negative.    Objective: Vital Signs: There were no vitals taken for this visit.  Physical Exam  Constitutional: He is oriented to person, place, and time. He appears well-developed and well-nourished.  HENT:  Head: Normocephalic and atraumatic.  Eyes: Pupils are equal, round, and reactive to light.  Neck: Neck supple.  Pulmonary/Chest: Effort normal.  Abdominal: Soft.  Musculoskeletal: Normal range of motion.  Neurological: He is alert and oriented to person, place, and time.  Skin: Skin is warm.  Psychiatric: He has a normal mood and affect. His behavior is normal. Judgment and thought content normal.  Nursing note and vitals reviewed.   Ortho Exam Bilateral hip exam shows no rotation essentially secondary to degenerative joint disease.  Left leg is slightly shorter than the right. Specialty Comments:  No specialty comments available.  Imaging: Xr Hip Unilat W Or W/o Pelvis 2-3  Views Left  Result Date: 02/06/2018 Severe degenerative joint disease of both hips worse on the left.  There is femoral head resorption with protrusion of paleness into the acetabulum    PMFS History: Patient Active Problem List   Diagnosis Date Noted  . Bilateral shoulder pain 01/04/2017  . Physical deconditioning   . Symptomatic anemia   . Tobacco abuse   . Melena 12/19/2016  . Acute blood loss anemia 12/19/2016  . ETOH abuse 12/19/2016  . UGIB (upper gastrointestinal bleed) 12/19/2016  . Hypotension, unspecified 04/04/2013  . Sepsis (HCC) 04/02/2013  . Bipolar affective disorder (HCC) 04/02/2013  . Polysubstance  abuse (HCC) 04/02/2013  . HTN (hypertension) 04/02/2013  . Syncope 04/02/2013  . Acute kidney injury (HCC) 04/02/2013   Past Medical History:  Diagnosis Date  . Bipolar 1 disorder (HCC)   . Hypertension   . TBI (traumatic brain injury) (HCC)     Family History  Problem Relation Age of Onset  . Asthma Mother   . Heart failure Mother   . Hypertension Father   . Depression Sister   . Alcohol abuse Other     Past Surgical History:  Procedure Laterality Date  . APPENDECTOMY    . ESOPHAGOGASTRODUODENOSCOPY N/A 12/20/2016   Procedure: ESOPHAGOGASTRODUODENOSCOPY (EGD);  Surgeon: Bernette Redbird, MD;  Location: [facility];  Service: Endoscopy;  Laterality: N/A;  . HIP FRACTURE SURGERY     Social History   Occupational History  . Not on file  Tobacco Use  . Smoking status: Current Every Day Smoker    Packs/day: 2.00    Types: Cigarettes  . Smokeless tobacco: Never Used  Substance and Sexual Activity  . Alcohol use: Yes    Comment: drinks 3 40 ounce bottles beer  . Drug use: Yes    Types: Marijuana    Comment: 2-3 times a week  . Sexual activity: Not on file

## 2018-09-23 ENCOUNTER — Other Ambulatory Visit (HOSPITAL_BASED_OUTPATIENT_CLINIC_OR_DEPARTMENT_OTHER): Payer: Self-pay

## 2018-09-23 DIAGNOSIS — G4730 Sleep apnea, unspecified: Secondary | ICD-10-CM

## 2018-10-24 ENCOUNTER — Ambulatory Visit (HOSPITAL_BASED_OUTPATIENT_CLINIC_OR_DEPARTMENT_OTHER): Payer: Medicaid Other | Attending: Internal Medicine | Admitting: Internal Medicine

## 2018-10-24 VITALS — Ht 73.0 in | Wt 288.0 lb

## 2018-10-24 DIAGNOSIS — G4719 Other hypersomnia: Secondary | ICD-10-CM | POA: Diagnosis not present

## 2018-10-24 DIAGNOSIS — R0683 Snoring: Secondary | ICD-10-CM | POA: Insufficient documentation

## 2018-10-24 DIAGNOSIS — I493 Ventricular premature depolarization: Secondary | ICD-10-CM | POA: Diagnosis not present

## 2018-10-24 DIAGNOSIS — R0902 Hypoxemia: Secondary | ICD-10-CM | POA: Diagnosis not present

## 2018-10-24 DIAGNOSIS — I10 Essential (primary) hypertension: Secondary | ICD-10-CM | POA: Diagnosis not present

## 2018-10-24 DIAGNOSIS — E669 Obesity, unspecified: Secondary | ICD-10-CM | POA: Insufficient documentation

## 2018-10-24 DIAGNOSIS — Z6838 Body mass index (BMI) 38.0-38.9, adult: Secondary | ICD-10-CM | POA: Insufficient documentation

## 2018-10-24 DIAGNOSIS — G4730 Sleep apnea, unspecified: Secondary | ICD-10-CM | POA: Diagnosis present

## 2018-11-01 DIAGNOSIS — G4730 Sleep apnea, unspecified: Secondary | ICD-10-CM | POA: Diagnosis not present

## 2018-11-01 NOTE — Procedures (Signed)
   Patient Name: Johnny Banks, Johnny Banks Study Date: 10/24/2018 Gender: Male D.O.B: Nov 01, 1961 Age (years): [age] Referring Provider: Fleet ContrasEdwin Avbuere Height (inches): 73 Interpreting Physician: Jetty Duhamellinton Becker Christopher MD, ABSM Weight (lbs): 288 RPSGT: Lowry RamMckinney, Takeya BMI: 38 MRN: [id number] Neck Size: 18.50  CLINICAL INFORMATION Sleep Study Type: NPSG Indication for sleep study: Excessive Daytime Sleepiness, Hypertension, Obesity, Snoring Epworth Sleepiness Score: 10  SLEEP STUDY TECHNIQUE As per the AASM Manual for the Scoring of Sleep and Associated Events v2.3 (April 2016) with a hypopnea requiring 4% desaturations.  The channels recorded and monitored were frontal, central and occipital EEG, electrooculogram (EOG), submentalis EMG (chin), nasal and oral airflow, thoracic and abdominal wall motion, anterior tibialis EMG, snore microphone, electrocardiogram, and pulse oximetry.  MEDICATIONS Medications self-administered by patient taken the night of the study : none reported  SLEEP ARCHITECTURE The study was initiated at 10:18:04 PM and ended at 5:18:09 AM.  Sleep onset time was 0.5 minutes and the sleep efficiency was 94.6%%. The total sleep time was 397.5 minutes.  Stage REM latency was 334.0 minutes.  The patient spent 2.1%% of the night in stage N1 sleep, 76.6%% in stage N2 sleep, 0.0%% in stage N3 and 21.3% in REM.  Alpha intrusion was absent.  Supine sleep was 42.77%.  RESPIRATORY PARAMETERS The overall apnea/hypopnea index (AHI) was 2.1 per hour. There were 7 total apneas, including 4 obstructive, 3 central and 0 mixed apneas. There were 7 hypopneas and 5 RERAs.  The AHI during Stage REM sleep was 2.8 per hour.  AHI while supine was 1.8 per hour.  The mean oxygen saturation was 91.1%. The minimum SpO2 during sleep was 74.0%.  loud snoring was noted during this study.  CARDIAC DATA The 2 lead EKG demonstrated sinus rhythm. The mean heart rate was 78.9 beats per minute. Other EKG  findings include: PVCs.  LEG MOVEMENT DATA The total PLMS were 0 with a resulting PLMS index of 0.0. Associated arousal with leg movement index was 2.9 .  IMPRESSIONS - No significant obstructive sleep apnea occurred during this study (AHI = 2.1/h). - No significant central sleep apnea occurred during this study (CAI = 0.5/h). - Moderate oxygen desaturation was noted during this study (Min O2 = 74.0%). Mean sat 91.1%.  - Totat time with saturation - The patient snored with loud snoring volume. - EKG findings include PVCs. - Clinically significant periodic limb movements did not occur during sleep. No significant associated arousals.  DIAGNOSIS - Nocturnal Hypoxemia (327.26 [G47.36 ICD-10]) - Primary snoring  RECOMMENDATIONS - Evaluate for supplemental O2 during sleep. - Be careful with alcohol, sedatives and other CNS depressants that may worsen sleep apnea and disrupt normal sleep architecture. - Sleep hygiene should be reviewed to assess factors that may improve sleep quality. - Weight management and regular exercise should be initiated or continued if appropriate.  [Electronically signed] 11/01/2018 02:03 PM  Jetty Duhamellinton Britlee Skolnik MD, ABSM Diplomate, American Board of Sleep Medicine   NPI: [id number]                          Jetty Duhamellinton Brihanna Devenport Diplomate, American Board of Sleep Medicine  ELECTRONICALLY SIGNED ON:  11/01/2018, 1:57 PM [facility] PH: [phone]   FX: [phone] ACCREDITED BY THE AMERICAN ACADEMY OF SLEEP MEDICINE

## 2018-11-27 ENCOUNTER — Ambulatory Visit (INDEPENDENT_AMBULATORY_CARE_PROVIDER_SITE_OTHER): Payer: Medicaid Other

## 2018-11-27 ENCOUNTER — Ambulatory Visit (INDEPENDENT_AMBULATORY_CARE_PROVIDER_SITE_OTHER): Payer: Medicaid Other | Admitting: Orthopaedic Surgery

## 2018-11-27 ENCOUNTER — Encounter (INDEPENDENT_AMBULATORY_CARE_PROVIDER_SITE_OTHER): Payer: Self-pay | Admitting: Orthopaedic Surgery

## 2018-11-27 VITALS — Ht 73.0 in | Wt 280.0 lb

## 2018-11-27 DIAGNOSIS — M1652 Unilateral post-traumatic osteoarthritis, left hip: Secondary | ICD-10-CM

## 2018-11-27 NOTE — Addendum Note (Signed)
Addended by: Albertina Parr on: 11/27/2018 01:02 PM   Modules accepted: Orders

## 2018-11-27 NOTE — Progress Notes (Signed)
Office Visit Note   Patient: Johnny Banks           Date of Birth: [birth year]           MRN: [id number] Visit Date: 11/27/2018              Requested by: Fleet ContrasAvbuere, Edwin, MD [address] [place], [place] [id number] PCP: Fleet ContrasAvbuere, Edwin, MD   Assessment & Plan: Visit Diagnoses:  1. 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  Patient has failed extensive conservative treatment.  He has gotten his social life back in order at this point.  Unfortunately he still smokes up to a pack of cigarettes a day.  He understands that he needs to cut back.  We will obtain albumin and prealbumin levels to assess his nutrition.  We will obtain a clearance from his PCP Dr. Concepcion ElkAvbuere.  We will schedule surgery pending clearance.  Questions encouraged and answered.  Risks and benefits and rehab and recovery were discussed with the patient.  Follow-Up Instructions: Return if symptoms worsen or fail to improve.   Orders:  Orders Placed This Encounter  Procedures  . XR HIP UNILAT W OR W/O PELVIS 2-3 VIEWS LEFT   No orders of the defined types were placed in this encounter.     Procedures: No procedures performed   Clinical Data: No additional findings.   Subjective: No chief complaint on file.   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  He lives in a group home and he has been stable medically.  He has occasional alcohol intake.  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  He has stable care at this point.  He presents today with his caretaker.  He has severe pain in his left hip that prevents him from ambulating any significant distances.  He has no quality of life.  He is in chronic  pain.   Review of Systems  Constitutional: Negative.   All other systems reviewed and are negative.    Objective: Vital Signs: Ht 6\' 1"  (1.854 m)   Wt 280 lb (127 kg)   BMI 36.94 kg/m   Physical Exam Vitals signs and nursing note reviewed.  Constitutional:      Appearance: He is well-developed.  HENT:     Head: Normocephalic and atraumatic.  Eyes:     Pupils: Pupils are equal, round, and reactive to light.  Neck:     Musculoskeletal: Neck supple.  Pulmonary:     Effort: Pulmonary effort is normal.  Abdominal:     Palpations: Abdomen is soft.  Musculoskeletal: Normal range of motion.  Skin:    General: Skin is warm.  Neurological:     Mental Status: He is alert and oriented to person, place, and time.  Psychiatric:        Behavior: Behavior normal.        Thought Content: Thought content normal.        Judgment: Judgment normal.     Ortho Exam Left hip exam shows minimal range of motion with significant pain.  No neurovascular compromise. Specialty Comments:  No specialty comments available.  Imaging: Xr Hip Unilat W Or W/o Pelvis 2-3 Views Left  Result Date: 11/27/2018 Advanced  DJD secondary to AVN with retained hardware    PMFS History: Patient Active Problem List   Diagnosis Date Noted  . Bilateral shoulder pain 01/04/2017  . Physical deconditioning   . Symptomatic anemia   . Tobacco abuse   . Melena 12/19/2016  . Acute blood loss anemia 12/19/2016  . ETOH abuse 12/19/2016  . UGIB (upper gastrointestinal bleed) 12/19/2016  . Hypotension, unspecified 04/04/2013  . Sepsis (HCC) 04/02/2013  . Bipolar affective disorder (HCC) 04/02/2013  . Polysubstance abuse (HCC) 04/02/2013  . HTN (hypertension) 04/02/2013  . Syncope 04/02/2013  . Acute kidney injury (HCC) 04/02/2013   Past Medical History:  Diagnosis Date  . Bipolar 1 disorder (HCC)   . Hypertension   . TBI (traumatic brain injury) (HCC)     Family History  Problem Relation Age of Onset   . Asthma Mother   . Heart failure Mother   . Hypertension Father   . Depression Sister   . Alcohol abuse Other     Past Surgical History:  Procedure Laterality Date  . APPENDECTOMY    . ESOPHAGOGASTRODUODENOSCOPY N/A 12/20/2016   Procedure: ESOPHAGOGASTRODUODENOSCOPY (EGD);  Surgeon: Bernette Redbird, MD;  Location: [facility];  Service: Endoscopy;  Laterality: N/A;  . HIP FRACTURE SURGERY     Social History   Occupational History  . Not on file  Tobacco Use  . Smoking status: Current Every Day Smoker    Packs/day: 2.00    Types: Cigarettes  . Smokeless tobacco: Never Used  Substance and Sexual Activity  . Alcohol use: Yes    Comment: drinks 3 40 ounce bottles beer  . Drug use: Yes    Types: Marijuana    Comment: 2-3 times a week  . Sexual activity: Not on file

## 2018-11-28 LAB — ALBUMIN: Albumin: 3.9 g/dL (ref 3.6–5.1)

## 2018-11-28 LAB — PREALBUMIN: PREALBUMIN: 18 mg/dL — AB (ref 21–43)

## 2018-11-28 LAB — EXTRA LAV TOP TUBE

## 2018-11-28 NOTE — Progress Notes (Signed)
Please let him know that his prealbumin is low which means he is lacking nutritionally so he needs to see his PCP about improving his nutrition and prealbumin so we can proceed with surgery

## 2019-01-15 ENCOUNTER — Telehealth (INDEPENDENT_AMBULATORY_CARE_PROVIDER_SITE_OTHER): Payer: Self-pay | Admitting: Orthopaedic Surgery

## 2019-01-15 NOTE — Telephone Encounter (Signed)
Patient called asked for a call back concerning his lab results. The number to contact patient is [phone]

## 2019-01-15 NOTE — Telephone Encounter (Signed)
We had already called patient about these labs previously. Not sure if there is anything new.   (Notes recorded by Albertina Parr, RMA on 11/28/2018 at 3:19 PM EST Called patient to advise. He is aware and will call his PCP.  ------  Notes recorded by Tarry Kos, MD on 11/28/2018 at 8:03 AM EST Please let him know that his prealbumin is low which means he is lacking nutritionally so he needs to see his PCP about improving his nutrition and prealbumin so we can proceed with surgery)    **Please call patient to advise in more detail about lab work.**

## 2019-01-22 ENCOUNTER — Telehealth (INDEPENDENT_AMBULATORY_CARE_PROVIDER_SITE_OTHER): Payer: Self-pay | Admitting: Orthopaedic Surgery

## 2019-01-22 NOTE — Telephone Encounter (Signed)
Patient called advised he want to talk to someone today concerning his labs. Patient said he was not told anything about his labs over a month ago. The number to contact patient is [phone]

## 2019-01-22 NOTE — Telephone Encounter (Signed)
We DID call patient with results. See previous messages. PLEASE call patient to advise in more detail. Don't know if Dr. Roda Shutters ever call him after I called him.       We had already called patient about these labs previously. Not sure if there is anything new.   (Notes recorded by Albertina Parr, RMA on 11/28/2018 at 3:19 PM EST Called patient to advise. He is aware and will call his PCP.  ------  Notes recorded by Tarry Kos, MD on 11/28/2018 at 8:03 AM EST Please let him know that his prealbumin is low which means he is lacking nutritionally so he needs to see his PCP about improving his nutrition and prealbumin so we can proceed with surgery)    **Please call patient to advise in more detail about lab work.**

## 2019-01-22 NOTE — Telephone Encounter (Signed)
Spoke to patient.  Sounds like pcp has advised him on things to eat.  Patient will call pcp to see at what point he needs to go back and have his labs redrawn

## 2020-03-24 ENCOUNTER — Ambulatory Visit: Payer: Medicaid Other | Admitting: Orthopaedic Surgery

## 2021-02-15 ENCOUNTER — Ambulatory Visit: Payer: Medicaid Other | Admitting: Orthopaedic Surgery

## 2021-07-13 ENCOUNTER — Ambulatory Visit: Payer: Medicaid Other | Admitting: Dermatology

## 2021-07-13 ENCOUNTER — Other Ambulatory Visit: Payer: Self-pay

## 2021-07-13 DIAGNOSIS — L720 Epidermal cyst: Secondary | ICD-10-CM | POA: Diagnosis not present

## 2021-07-13 DIAGNOSIS — L280 Lichen simplex chronicus: Secondary | ICD-10-CM

## 2021-07-13 DIAGNOSIS — L304 Erythema intertrigo: Secondary | ICD-10-CM

## 2021-07-13 MED ORDER — ELIDEL 1 % EX CREA: TOPICAL_CREAM | Freq: Every day | CUTANEOUS | 2 refills | Status: DC

## 2021-07-13 NOTE — Progress Notes (Signed)
   New Patient Visit  Subjective  Johnny Banks is a 60 y.o. male who presents for the following: Spot Check (Pt c/o of itchiness and bumps in the scrotum area. He states that his PCP told him that it is fungal and has been treating with Lamisil, clotrimazole cream, and clotrimazole/betamethasone cream. Pt has been using hyrocortisone cream in the areas to relieve the itch but states that nothing is clearing it up. ).  Objective  Well appearing patient in no apparent distress; mood and affect are within normal limits.  A focused examination was performed including scrotum. Relevant physical exam findings are noted in the Assessment and Plan.  Scrotum Multiple white firm papules  Assessment & Plan  Epidermal cyst Scrotum Discussed that they would need to be removed with in office excision and we will only remove one at a time. Will discuss excisions at f/u.  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 Apply to affected area at bedtime.  Start [facility] intertrigo cream. Apply to affected area in the mornings.  ELIDEL 1 % cream - Scrotum Apply topically at bedtime.  Intertrigo is a chronic recurrent rash that occurs in skin fold areas that may be associated with friction; heat; moisture; yeast; fungus; and bacteria.  It is exacerbated by increased movement / activity; sweating; and higher atmospheric temperature.  Return in about 6 weeks (around 08/24/2021) for intertrigo .  IEpifania Gore, CMA, am acting as scribe for Armida Sans, MD. Documentation: I have reviewed the above documentation for accuracy and completeness, and I agree with the above.  Armida Sans, MD

## 2021-07-13 NOTE — Patient Instructions (Signed)
Start Elidel. Apply to affected area at bedtime.  Start [facility] intertrigo cream. Apply to affected area in the mornings.    Instructions for [facility] Medications  One or more of your medications was sent to the [facility] mail order compounding pharmacy. You will receive an email from them and can purchase the medicine through that link. It will then be mailed to your home at the address you confirmed. If for any reason you do not receive an email from them, please check your spam folder. If you still do not find the email, please let us know. [facility] phone number is [phone].

## 2021-07-19 ENCOUNTER — Encounter: Payer: Self-pay | Admitting: Dermatology

## 2021-08-24 ENCOUNTER — Encounter: Payer: Self-pay | Admitting: Dermatology

## 2021-08-24 ENCOUNTER — Ambulatory Visit (INDEPENDENT_AMBULATORY_CARE_PROVIDER_SITE_OTHER): Payer: Medicaid Other | Admitting: Dermatology

## 2021-08-24 ENCOUNTER — Other Ambulatory Visit: Payer: Self-pay

## 2021-08-24 DIAGNOSIS — L304 Erythema intertrigo: Secondary | ICD-10-CM | POA: Diagnosis not present

## 2021-08-24 DIAGNOSIS — L720 Epidermal cyst: Secondary | ICD-10-CM

## 2021-08-24 NOTE — Progress Notes (Signed)
   Follow-Up Visit   Subjective  Johnny Banks is a 60 y.o. male who presents for the following: Follow-up (6 week recheck. Intertrigo, scrotum. Elidel was $58. Patient would like to change to similar medication that is covered by [employer]. Has been using [facility] intertrigo cream in morning and Elidel at bedtime. Patient states rash is better but has been itching lately because he is out of morning medicine. ).  The following portions of the chart were reviewed this encounter and updated as appropriate:  Tobacco  Allergies  Meds  Problems  Med Hx  Surg Hx  Fam Hx     Review of Systems: No other skin or systemic complaints except as noted in HPI or Assessment and Plan.  Objective  Well appearing patient in no apparent distress; mood and affect are within normal limits.  A focused examination was performed including face, groin. Relevant physical exam findings are noted in the Assessment and Plan.  Scrotum Area mostly clear today.  Scrotum Multiple white firm papules  Assessment & Plan  Intertrigo Groin Improving Intertrigo is a chronic recurrent rash that occurs in skin fold areas that may be associated with friction; heat; moisture; yeast; fungus; and bacteria.  It is exacerbated by increased movement / activity; sweating; and higher atmospheric temperature.  Elidel not cost effective for patient -so we will discontinue.  Can increase [facility] Intertrigo cream to BID, use QD PRN.  Related Medications ELIDEL 1 % cream Apply topically at bedtime.  Epidermal inclusion cyst Scrotum Benign-appearing. Exam most consistent with an epidermal inclusion cyst. Discussed that a cyst is a benign growth that can grow over time and sometimes get irritated or inflamed. Recommend observation if it is not bothersome. Discussed option of surgical excision to remove it if it is growing, symptomatic, or other changes noted. Please call for new or changing lesions so they can be  evaluated. Discussed that they would need to be removed with in office excision and we will only remove one at a time. The patient needs multiple joint replacements, and we will schedule cyst excisions of the scrotum around these other necessary procedures.  Return for Intertrigo recheck 6 months.  I, Lawson Radar, CMA, am acting as scribe for Armida Sans, MD. Documentation: I have reviewed the above documentation for accuracy and completeness, and I agree with the above.  Armida Sans, MD

## 2021-08-24 NOTE — Patient Instructions (Addendum)
Intertrigo is a chronic recurrent rash that occurs in skin fold areas that may be associated with friction; heat; moisture; yeast; fungus; and bacteria.  It is exacerbated by increased movement / activity; sweating; and higher atmospheric temperature.  Instructions for [facility] Medications  One or more of your medications was sent to the [facility] mail order compounding pharmacy. You will receive an email from them and can purchase the medicine through that link. It will then be mailed to your home at the address you confirmed. If for any reason you do not receive an email from them, please check your spam folder. If you still do not find the email, please let us know. [facility] phone number is [phone].   If you have any questions or concerns for your doctor, please call our main line at [phone] and press option 4 to reach your doctor's medical assistant. If no one answers, please leave a voicemail as directed and we will return your call as soon as possible. Messages left after 4 pm will be answered the following business day.   You may also send [place] a message via MyChart. We typically respond to MyChart messages within 1-2 business days.  For prescription refills, please ask your pharmacy to contact our office. Our fax number is [phone].  If you have an urgent issue when the clinic is closed that cannot wait until the next business day, you can page your doctor at the number below.    Please note that while we do our best to be available for urgent issues outside of office hours, we are not available 24/7.   If you have an urgent issue and are unable to reach [place], you may choose to seek medical care at your doctor's office, retail clinic, urgent care center, or emergency room.  If you have a medical emergency, please immediately call 911 or go to the emergency department.  Pager Numbers  - Dr. Gwen Pounds: [phone]  - Dr. Neale Burly: [phone]  - Dr. Roseanne Reno:  [phone]  In the event of inclement weather, please call our main line at [phone] for an update on the status of any delays or closures.  Dermatology Medication Tips: Please keep the boxes that topical medications come in in order to help keep track of the instructions about where and how to use these. Pharmacies typically print the medication instructions only on the boxes and not directly on the medication tubes.   If your medication is too expensive, please contact our office at [phone] option 4 or send [place] a message through MyChart.   We are unable to tell what your co-pay for medications will be in advance as this is different depending on your insurance coverage. However, we may be able to find a substitute medication at lower cost or fill out paperwork to get insurance to cover a needed medication.   If a prior authorization is required to get your medication covered by your insurance company, please allow [place] 1-2 business days to complete this process.  Drug prices often vary depending on where the prescription is filled and some pharmacies may offer cheaper prices.  The website www.goodrx.com contains coupons for medications through different pharmacies. The prices here do not account for what the cost may be with help from insurance (it may be cheaper with your insurance), but the website can give you the price if you did not use any insurance.  - You can print the associated coupon and take it with your prescription  to the pharmacy.  - You may also stop by our office during regular business hours and pick up a GoodRx coupon card.  - If you need your prescription sent electronically to a different pharmacy, notify our office through [facility] or by phone at [phone] option 4.

## 2021-08-31 ENCOUNTER — Ambulatory Visit (INDEPENDENT_AMBULATORY_CARE_PROVIDER_SITE_OTHER): Payer: Medicaid Other | Admitting: Orthopaedic Surgery

## 2021-08-31 ENCOUNTER — Other Ambulatory Visit: Payer: Self-pay

## 2021-08-31 ENCOUNTER — Encounter: Payer: Self-pay | Admitting: Orthopaedic Surgery

## 2021-08-31 ENCOUNTER — Ambulatory Visit: Payer: Self-pay

## 2021-08-31 DIAGNOSIS — M1651 Unilateral post-traumatic osteoarthritis, right hip: Secondary | ICD-10-CM | POA: Diagnosis not present

## 2021-08-31 DIAGNOSIS — M1652 Unilateral post-traumatic osteoarthritis, left hip: Secondary | ICD-10-CM | POA: Diagnosis not present

## 2021-08-31 DIAGNOSIS — M25551 Pain in right hip: Secondary | ICD-10-CM

## 2021-08-31 NOTE — Progress Notes (Signed)
Office Visit Note   Patient: Johnny Banks           Date of Birth: Mar 21, 1961           MRN: [id number] Visit Date: 08/31/2021              Requested by: Fleet Contras, MD [address] [place],  [place] [address] PCP: Fleet Contras, MD   Assessment & Plan: Visit Diagnoses:  1. Post-traumatic osteoarthritis of left hip   2. Post-traumatic osteoarthritis of right hip     Plan: Impression is bilateral hip posttraumatic arthritis.  At this point, the patient is still interested in proceeding with surgical intervention.  He is in agreement that he would like the left hip to be replaced first.  He notes that he is currently in a group home and plans to be there for some time.  Today, we will draw labs to include a prealbumin and hemoglobin A1c.  We will call him with the results.  If he is able to demonstrate responsibility and his labs are within normal limits, we will likely proceed with left total hip arthroplasty.  We will need PCP clearance if his lab work is acceptable for surgery.    Follow-Up Instructions: Return in about 2 weeks (around 09/14/2021).   Orders:  Orders Placed This Encounter  Procedures   XR HIPS BILAT W OR W/O PELVIS 3-4 VIEWS   Prealbumin   Hemoglobin A1C   No orders of the defined types were placed in this encounter.     Procedures: No procedures performed   Clinical Data: No additional findings.   Subjective: Chief Complaint  Patient presents with   Left Hip - Pain   Right Hip - Pain    HPI patient is a pleasant 60 year old gentleman who comes in today with continued bilateral hip pain.  He has been dealing with this for years.  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  He ambulates in a wheelchair and a walker but notes that he has significant pain with ambulation.  All of his pain is to the groin.  Unable to previously undergo surgery due to low  prealbumin.  Review of Systems as detailed in HPI.  All others reviewed and are negative.   Objective: Vital Signs: There were no vitals taken for this visit.  Physical Exam well-developed well-nourished gentleman in no acute distress.  Alert and oriented x3.  Ortho Exam bilateral hip exam shows markedly positive logroll with very little internal rotation.  He is neurovascular intact distally.  Specialty Comments:  No specialty comments available.  Imaging: XR HIPS BILAT W OR W/O PELVIS 3-4 VIEWS  Result Date: 09/01/2021 X-rays demonstrate significant degenerative changes to both hips from prior SCFE.  There are embedded Knowles pins in both hips.      PMFS History: Patient Active Problem List   Diagnosis Date Noted   Bilateral shoulder pain 01/04/2017   Physical deconditioning    Symptomatic anemia    Tobacco abuse    Melena 12/19/2016   Acute blood loss anemia 12/19/2016   ETOH abuse 12/19/2016   UGIB (upper gastrointestinal bleed) 12/19/2016   Hypotension, unspecified 04/04/2013   Sepsis (HCC) 04/02/2013   Bipolar affective disorder (HCC) 04/02/2013   Polysubstance abuse (HCC) 04/02/2013   HTN (hypertension) 04/02/2013   Syncope 04/02/2013   Acute kidney injury (HCC) 04/02/2013   Past Medical History:  Diagnosis Date  Bipolar 1 disorder (HCC)    Hypertension    TBI (traumatic brain injury)     Family History  Problem Relation Age of Onset   Asthma Mother    Heart failure Mother    Hypertension Father    Depression Sister    Alcohol abuse Other     Past Surgical History:  Procedure Laterality Date   APPENDECTOMY     ESOPHAGOGASTRODUODENOSCOPY N/A 12/20/2016   Procedure: ESOPHAGOGASTRODUODENOSCOPY (EGD);  Surgeon: Bernette Redbird, MD;  Location: [facility];  Service: Endoscopy;  Laterality: N/A;   HIP FRACTURE SURGERY     Social History   Occupational History   Not on file  Tobacco Use   Smoking status: Every Day    Packs/day: 2.00    Types:  Cigarettes   Smokeless tobacco: Never  Substance and Sexual Activity   Alcohol use: Yes    Comment: drinks 3 40 ounce bottles beer   Drug use: Yes    Types: Marijuana    Comment: 2-3 times a week   Sexual activity: Not on file

## 2021-09-01 LAB — HEMOGLOBIN A1C
Hgb A1c MFr Bld: 5.3 % of total Hgb (ref <5.7)
Mean Plasma Glucose: 105 mg/dL
eAG (mmol/L): 5.8 mmol/L

## 2021-09-01 LAB — PREALBUMIN: Prealbumin: 21 mg/dL (ref 21–43)

## 2021-09-04 ENCOUNTER — Telehealth: Payer: Self-pay

## 2021-09-04 NOTE — Telephone Encounter (Signed)
Pt called he would like a rx for Intertrigo that medicare will pay for, he can not afford [facility] Intertrigo cream

## 2021-09-05 MED ORDER — KETOCONAZOLE 2 % EX CREA: 1.0000 "application " | TOPICAL_CREAM | Freq: Two times a day (BID) | CUTANEOUS | 0 refills | Status: DC

## 2021-09-05 NOTE — Telephone Encounter (Signed)
Pt would like to try Ketoconazole cream for his intertrigo, erx'd Ketoconazole cream  to [facility]

## 2021-09-07 ENCOUNTER — Telehealth: Payer: Self-pay

## 2021-09-07 NOTE — Telephone Encounter (Signed)
They were within normal limits.  We are in the process of getting medical clearance for surgery.

## 2021-09-07 NOTE — Telephone Encounter (Signed)
Pt called asking for an update on his labs that were sent off

## 2021-09-08 NOTE — Telephone Encounter (Signed)
Line was disconnected. We were on a three way. Just need to advise on message below.

## 2021-09-14 ENCOUNTER — Encounter: Payer: Self-pay | Admitting: Orthopaedic Surgery

## 2021-09-14 ENCOUNTER — Other Ambulatory Visit: Payer: Self-pay

## 2021-09-14 ENCOUNTER — Ambulatory Visit (INDEPENDENT_AMBULATORY_CARE_PROVIDER_SITE_OTHER): Payer: Medicaid Other | Admitting: Orthopaedic Surgery

## 2021-09-14 DIAGNOSIS — M1652 Unilateral post-traumatic osteoarthritis, left hip: Secondary | ICD-10-CM | POA: Diagnosis not present

## 2021-09-14 DIAGNOSIS — M1651 Unilateral post-traumatic osteoarthritis, right hip: Secondary | ICD-10-CM | POA: Diagnosis not present

## 2021-09-14 NOTE — Progress Notes (Signed)
Office Visit Note   Patient: Johnny Banks           Date of Birth: November 14, 1960           MRN: [id number] Visit Date: 09/14/2021              Requested by: Fleet Contras, MD [address] [place],  [place] [address] PCP: Fleet Contras, MD   Assessment & Plan: Visit Diagnoses:  1. Post-traumatic osteoarthritis of left hip   2. Post-traumatic osteoarthritis of right hip     Plan: Impression is end-stage bilateral posttraumatic DJD.  His left hip is more severely affected.  There is presence of 4 Knowles pins from prior fixation for SCFE which were placed in the 1970s.  Given the combination the hardware, flexion contracture, external rotation contracture I feel that this would best be treated at a tertiary care center.  I recommended a referral to [facility] for subspecialty total joint fellowship trained surgeon who I feel will be most appropriate for his complex problem.  He is agreeable to the referral.  Follow-Up Instructions: No follow-ups on file.   Orders:  No orders of the defined types were placed in this encounter.  No orders of the defined types were placed in this encounter.     Procedures: No procedures performed   Clinical Data: No additional findings.   Subjective: Chief Complaint  Patient presents with   Right Hip - Pain   Left Hip - Pain    Johnny Banks returns today for a follow-up severe bilateral hip DJD.  Continues to have severe and debilitating pain.  He walks with a walker.  Lives in a group home.  His prealbumin and A1c levels were all acceptable for surgery.   Review of Systems  Constitutional: Negative.   All other systems reviewed and are negative.   Objective: Vital Signs: There were no vitals taken for this visit.  Physical Exam Vitals and nursing note reviewed.  Constitutional:      Appearance: He is well-developed.  Pulmonary:     Effort: Pulmonary effort is normal.  Abdominal:     Palpations: Abdomen  is soft.  Skin:    General: Skin is warm.  Neurological:     Mental Status: He is alert and oriented to person, place, and time.  Psychiatric:        Behavior: Behavior normal.        Thought Content: Thought content normal.        Judgment: Judgment normal.    Ortho Exam  Bilateral hips exhibit a mild flexion contracture and the left hip is essentially ankylosed at 25 degrees of external rotation.  Specialty Comments:  No specialty comments available.  Imaging: No results found.   PMFS History: Patient Active Problem List   Diagnosis Date Noted   Bilateral shoulder pain 01/04/2017   Physical deconditioning    Symptomatic anemia    Tobacco abuse    Melena 12/19/2016   Acute blood loss anemia 12/19/2016   ETOH abuse 12/19/2016   UGIB (upper gastrointestinal bleed) 12/19/2016   Hypotension, unspecified 04/04/2013   Sepsis (HCC) 04/02/2013   Bipolar affective disorder (HCC) 04/02/2013   Polysubstance abuse (HCC) 04/02/2013   HTN (hypertension) 04/02/2013   Syncope 04/02/2013   Acute kidney injury (HCC) 04/02/2013   Past Medical History:  Diagnosis Date   Bipolar 1 disorder (HCC)    Hypertension    TBI (traumatic brain injury)     Family  History  Problem Relation Age of Onset   Asthma Mother    Heart failure Mother    Hypertension Father    Depression Sister    Alcohol abuse Other     Past Surgical History:  Procedure Laterality Date   APPENDECTOMY     ESOPHAGOGASTRODUODENOSCOPY N/A 12/20/2016   Procedure: ESOPHAGOGASTRODUODENOSCOPY (EGD);  Surgeon: Bernette Redbird, MD;  Location: [facility];  Service: Endoscopy;  Laterality: N/A;   HIP FRACTURE SURGERY     Social History   Occupational History   Not on file  Tobacco Use   Smoking status: Every Day    Packs/day: 2.00    Types: Cigarettes   Smokeless tobacco: Never  Substance and Sexual Activity   Alcohol use: Yes    Comment: drinks 3 40 ounce bottles beer   Drug use: Yes    Types: Marijuana     Comment: 2-3 times a week   Sexual activity: Not on file

## 2021-10-02 ENCOUNTER — Other Ambulatory Visit: Payer: Self-pay | Admitting: Dermatology

## 2021-10-02 DIAGNOSIS — L304 Erythema intertrigo: Secondary | ICD-10-CM

## 2021-10-30 ENCOUNTER — Telehealth: Payer: Self-pay | Admitting: Orthopaedic Surgery

## 2021-10-30 NOTE — Telephone Encounter (Signed)
Patient aware CD is ready for pickup at front desk.  

## 2021-10-30 NOTE — Telephone Encounter (Signed)
Received call from pt. Dr. Roda Shutters referred him to another MD and needs hip & pelvis xrays on CD and would like to pickup on Thursday morning. Please call when ready. [phone]

## 2021-12-19 ENCOUNTER — Other Ambulatory Visit: Payer: Self-pay | Admitting: Dermatology

## 2022-02-28 ENCOUNTER — Ambulatory Visit: Payer: Medicaid Other | Admitting: Dermatology

## 2022-02-28 DIAGNOSIS — L814 Other melanin hyperpigmentation: Secondary | ICD-10-CM | POA: Diagnosis not present

## 2022-02-28 DIAGNOSIS — L304 Erythema intertrigo: Secondary | ICD-10-CM

## 2022-02-28 DIAGNOSIS — L720 Epidermal cyst: Secondary | ICD-10-CM

## 2022-02-28 DIAGNOSIS — L578 Other skin changes due to chronic exposure to nonionizing radiation: Secondary | ICD-10-CM | POA: Diagnosis not present

## 2022-02-28 MED ORDER — KETOCONAZOLE 2 % EX CREA: TOPICAL_CREAM | CUTANEOUS | 6 refills | Status: DC

## 2022-02-28 MED ORDER — PIMECROLIMUS 1 % EX CREA: TOPICAL_CREAM | CUTANEOUS | 6 refills | Status: DC

## 2022-02-28 NOTE — Patient Instructions (Signed)

## 2022-02-28 NOTE — Progress Notes (Signed)
? ?  Follow-Up Visit ?  ?Subjective  ?Johnny Banks is a 61 y.o. male who presents for the following: Rash (6 months f/u Intertrigo in the groin treating with Elidel cream and ketoconazole cream with a good response ). Cyst in the groin area growing, pt would like to have cyst checked today.  ?The patient has spots, moles and lesions to be evaluated, some may be new or changing and the patient has concerns that these could be cancer. ? ?The following portions of the chart were reviewed this encounter and updated as appropriate:  ? Tobacco  Allergies  Meds  Problems  Med Hx  Surg Hx  Fam Hx   ?  ?Review of Systems:  No other skin or systemic complaints except as noted in HPI or Assessment and Plan. ? ?Objective  ?Well appearing patient in no apparent distress; mood and affect are within normal limits. ? ?A focused examination was performed including groin. Relevant physical exam findings are noted in the Assessment and Plan. ? ?groin, scrotum ?Pinkness on the scrotum clear in the groin  ? ?scrotum ?Subcutaneous nodule.  ? ? ?Assessment & Plan  ?Intertrigo ?groin, scrotum ?Improving ?Intertrigo is a chronic recurrent rash that occurs in skin fold areas that may be associated with friction; heat; moisture; yeast; fungus; and bacteria.  It is exacerbated by increased movement / activity; sweating; and higher atmospheric temperature.  ?Chronic and persistent condition with duration or expected duration over one year. Condition is symptomatic / bothersome to patient. Not to goal. ?Continue Elidel and ketoconazole as prescribed ?Related Medications ?pimecrolimus (ELIDEL) 1 % cream ?Apply to affected skin in the morning ? ?ketoconazole (NIZORAL) 2 % cream ?Apply to affected skin at bedtime ? ?Epidermal inclusion cyst ?scrotum ?Benign-appearing. Exam most consistent with an epidermal inclusion cyst. Discussed that a cyst is a benign growth that can grow over time and sometimes get irritated or inflamed. Recommend  observation if it is not bothersome. Discussed option of surgical excision to remove it if it is growing, symptomatic, or other changes noted. Please call for new or changing lesions so they can be evaluated. ?  ?Pt report he is going for hip and knee replacements soon.  ?Recommend pt have his hip and knee replacements before cyst surgery  ? ?Actinic Damage ?- chronic, secondary to cumulative UV radiation exposure/sun exposure over time ?- diffuse scaly erythematous macules with underlying dyspigmentation ?- Recommend daily broad spectrum sunscreen SPF 30+ to sun-exposed areas, reapply every 2 hours as needed.  ?- Recommend staying in the shade or wearing long sleeves, sun glasses (UVA+UVB protection) and wide brim hats (4-inch brim around the entire circumference of the hat). ?- Call for new or changing lesions. ? ?Lentigines ?- Scattered tan macules ?- Due to sun exposure ?- Benign-appering, observe ?- Recommend daily broad spectrum sunscreen SPF 30+ to sun-exposed areas, reapply every 2 hours as needed. ?- Call for any changes ? ?Return in about 9 months (around 11/30/2022) for Intertrigo, cyst . ? ?I, Angelique Holm, CMA, am acting as scribe for Armida Sans, MD .  ?Documentation: I have reviewed the above documentation for accuracy and completeness, and I agree with the above. ? ?Armida Sans, MD ? ?

## 2022-03-09 ENCOUNTER — Encounter: Payer: Self-pay | Admitting: Dermatology

## 2022-03-23 ENCOUNTER — Other Ambulatory Visit: Payer: Self-pay | Admitting: Internal Medicine

## 2022-03-24 LAB — COMPLETE METABOLIC PANEL WITHOUT GFR
AG Ratio: 1.5 (calc) (ref 1.0–2.5)
ALT: 10 U/L (ref 9–46)
AST: 11 U/L (ref 10–35)
Albumin: 4.1 g/dL (ref 3.6–5.1)
Alkaline phosphatase (APISO): 62 U/L (ref 35–144)
BUN: 13 mg/dL (ref 7–25)
CO2: 26 mmol/L (ref 20–32)
Calcium: 9.1 mg/dL (ref 8.6–10.3)
Chloride: 102 mmol/L (ref 98–110)
Creat: 0.81 mg/dL (ref 0.70–1.35)
Globulin: 2.7 g/dL (ref 1.9–3.7)
Glucose, Bld: 76 mg/dL (ref 65–99)
Potassium: 4.5 mmol/L (ref 3.5–5.3)
Sodium: 138 mmol/L (ref 135–146)
Total Bilirubin: 0.5 mg/dL (ref 0.2–1.2)
Total Protein: 6.8 g/dL (ref 6.1–8.1)
eGFR: 100 mL/min/1.73m2

## 2022-03-24 LAB — CBC
HCT: 45 % (ref 38.5–50.0)
Hemoglobin: 15 g/dL (ref 13.2–17.1)
MCH: 31.4 pg (ref 27.0–33.0)
MCHC: 33.3 g/dL (ref 32.0–36.0)
MCV: 94.3 fL (ref 80.0–100.0)
MPV: 10.1 fL (ref 7.5–12.5)
Platelets: 205 10*3/uL (ref 140–400)
RBC: 4.77 10*6/uL (ref 4.20–5.80)
RDW: 12.9 % (ref 11.0–15.0)
WBC: 6.4 10*3/uL (ref 3.8–10.8)

## 2022-03-24 LAB — TSH: TSH: 0.52 mIU/L (ref 0.40–4.50)

## 2022-03-24 LAB — LIPID PANEL
Cholesterol: 162 mg/dL
HDL: 34 mg/dL — ABNORMAL LOW
LDL Cholesterol (Calc): 102 mg/dL — ABNORMAL HIGH
Non-HDL Cholesterol (Calc): 128 mg/dL
Total CHOL/HDL Ratio: 4.8 (calc)
Triglycerides: 161 mg/dL — ABNORMAL HIGH

## 2022-03-24 LAB — PSA: PSA: 0.85 ng/mL

## 2022-03-24 LAB — VITAMIN D 25 HYDROXY (VIT D DEFICIENCY, FRACTURES): Vit D, 25-Hydroxy: 37 ng/mL (ref 30–100)

## 2022-05-08 ENCOUNTER — Encounter (HOSPITAL_COMMUNITY): Payer: Self-pay

## 2022-05-08 ENCOUNTER — Emergency Department (HOSPITAL_COMMUNITY)
Admission: EM | Admit: 2022-05-08 | Discharge: 2022-05-09 | Disposition: A | Discharge status: Home / Self Care | Payer: Medicaid Other | Attending: Emergency Medicine | Admitting: Emergency Medicine

## 2022-05-08 ENCOUNTER — Emergency Department (HOSPITAL_COMMUNITY): Payer: Medicaid Other

## 2022-05-08 DIAGNOSIS — I10 Essential (primary) hypertension: Secondary | ICD-10-CM | POA: Insufficient documentation

## 2022-05-08 DIAGNOSIS — W050XXA Fall from non-moving wheelchair, initial encounter: Secondary | ICD-10-CM | POA: Diagnosis not present

## 2022-05-08 DIAGNOSIS — S0091XA Abrasion of unspecified part of head, initial encounter: Secondary | ICD-10-CM | POA: Insufficient documentation

## 2022-05-08 DIAGNOSIS — M25551 Pain in right hip: Secondary | ICD-10-CM | POA: Diagnosis not present

## 2022-05-08 DIAGNOSIS — G8929 Other chronic pain: Secondary | ICD-10-CM | POA: Diagnosis not present

## 2022-05-08 DIAGNOSIS — J449 Chronic obstructive pulmonary disease, unspecified: Secondary | ICD-10-CM | POA: Diagnosis not present

## 2022-05-08 DIAGNOSIS — Z79899 Other long term (current) drug therapy: Secondary | ICD-10-CM | POA: Insufficient documentation

## 2022-05-08 DIAGNOSIS — W19XXXA Unspecified fall, initial encounter: Secondary | ICD-10-CM

## 2022-05-08 DIAGNOSIS — F10920 Alcohol use, unspecified with intoxication, uncomplicated: Secondary | ICD-10-CM | POA: Diagnosis not present

## 2022-05-08 DIAGNOSIS — S0990XA Unspecified injury of head, initial encounter: Secondary | ICD-10-CM | POA: Diagnosis present

## 2022-05-08 DIAGNOSIS — M25552 Pain in left hip: Secondary | ICD-10-CM | POA: Insufficient documentation

## 2022-05-08 LAB — COMPREHENSIVE METABOLIC PANEL
ALT: 13 U/L (ref 0–44)
AST: 14 U/L — ABNORMAL LOW (ref 15–41)
Albumin: 4 g/dL (ref 3.5–5.0)
Alkaline Phosphatase: 58 U/L (ref 38–126)
Anion gap: 8 (ref 5–15)
BUN: 13 mg/dL (ref 8–23)
CO2: 28 mmol/L (ref 22–32)
Calcium: 9 mg/dL (ref 8.9–10.3)
Chloride: 106 mmol/L (ref 98–111)
Creatinine, Ser: 0.91 mg/dL (ref 0.61–1.24)
GFR, Estimated: >60 mL/min (ref >60)
Glucose, Bld: 103 mg/dL — ABNORMAL HIGH (ref 70–99)
Potassium: 3.8 mmol/L (ref 3.5–5.1)
Sodium: 142 mmol/L (ref 135–145)
Total Bilirubin: 0.6 mg/dL (ref 0.3–1.2)
Total Protein: 7.1 g/dL (ref 6.5–8.1)

## 2022-05-08 LAB — URINALYSIS, ROUTINE W REFLEX MICROSCOPIC
Bilirubin Urine: NEGATIVE
Glucose, UA: NEGATIVE mg/dL
Hgb urine dipstick: NEGATIVE
Ketones, ur: NEGATIVE mg/dL
Leukocytes,Ua: NEGATIVE
Nitrite: NEGATIVE
Protein, ur: NEGATIVE mg/dL
Specific Gravity, Urine: 1.004 — ABNORMAL LOW (ref 1.005–1.030)
pH: 6 (ref 5.0–8.0)

## 2022-05-08 LAB — RAPID URINE DRUG SCREEN, HOSP PERFORMED
Amphetamines: NOT DETECTED
Barbiturates: NOT DETECTED
Benzodiazepines: POSITIVE — AB
Cocaine: NOT DETECTED
Opiates: NOT DETECTED
Tetrahydrocannabinol: NOT DETECTED

## 2022-05-08 LAB — CBC
HCT: 42.7 % (ref 39.0–52.0)
Hemoglobin: 14.2 g/dL (ref 13.0–17.0)
MCH: 32.1 pg (ref 26.0–34.0)
MCHC: 33.3 g/dL (ref 30.0–36.0)
MCV: 96.4 fL (ref 80.0–100.0)
Platelets: 185 10*3/uL (ref 150–400)
RBC: 4.43 MIL/uL (ref 4.22–5.81)
RDW: 13.4 % (ref 11.5–15.5)
WBC: 8.2 10*3/uL (ref 4.0–10.5)
nRBC: 0 % (ref 0.0–0.2)

## 2022-05-08 LAB — ETHANOL: Alcohol, Ethyl (B): 259 mg/dL — ABNORMAL HIGH (ref <10)

## 2022-05-08 LAB — MAGNESIUM: Magnesium: 2.2 mg/dL (ref 1.7–2.4)

## 2022-05-08 MED ORDER — LACTATED RINGERS IV BOLUS
1000.0000 mL | Freq: Once | INTRAVENOUS | Status: AC
  Administered 2022-05-08: 1000 mL via INTRAVENOUS

## 2022-05-08 MED ORDER — THIAMINE HCL 100 MG/ML IJ SOLN
100.0000 mg | Freq: Once | INTRAMUSCULAR | Status: AC
  Administered 2022-05-08: 100 mg via INTRAVENOUS
  Filled 2022-05-08: qty 2

## 2022-05-08 NOTE — ED Provider Notes (Signed)
[facility] Provider Note   CSN: [id number] Arrival date & time: 05/08/22  1826     History  Chief Complaint  Patient presents with   Hip Pain   Fall    Johnny GauzeDavid K Banks is a 61 y.o. male.  HPI Patient presents after a fall.  This was reportedly out of his wheelchair.  Bystanders saw him laying half on the street, half on the sidewalk.  EMS was called.  Patient appeared intoxicated and reportedly blew a 0.17 with police officers.  He endorses acute on chronic bilateral hip pain.  EMS noted abrasion to the side of his head.  No other injuries are suspected.  Medical history includes alcohol abuse, polysubstance abuse, bipolar disorder, HTN, GERD, OSA, COPD, and GI bleeding.  Patient unable to provide further history due to altered mental state.    Home Medications Prior to Admission medications   Medication Sig Start Date End Date Taking? Authorizing Provider  acetaminophen (TYLENOL) 500 MG tablet Take 1,000 mg by mouth every 8 (eight) hours as needed.    [provider]  Amino Acids-Protein Hydrolys (FEEDING SUPPLEMENT, PRO-STAT SUGAR FREE 64,) LIQD Take 30 mLs by mouth 3 (three) times daily with meals.    [provider]  diazepam (VALIUM) 5 MG tablet Take 5 mg by mouth every 12 (twelve) hours as needed for anxiety.    [provider]  folic acid (FOLVITE) 1 MG tablet Take 1 tablet (1 mg total) by mouth daily. 12/25/16   Vassie LollMadera, Carlos, MD  gabapentin (NEURONTIN) 300 MG capsule Take 600 mg by mouth 3 (three) times daily.     [provider]  iron polysaccharides (NIFEREX) 150 MG capsule Take 1 capsule (150 mg total) by mouth daily. 12/25/16   Vassie LollMadera, Carlos, MD  ketoconazole (NIZORAL) 2 % cream Apply to affected skin at bedtime 02/28/22   Deirdre EvenerKowalski, Quindon C, MD  methocarbamol (ROBAXIN) 750 MG tablet Take 750 mg by mouth 4 (four) times daily.    [provider]  pantoprazole (PROTONIX) 40 MG tablet Take 40 mg by  mouth daily.    [provider]  PARoxetine (PAXIL) 40 MG tablet Take 40 mg by mouth daily.    [provider]  pimecrolimus (ELIDEL) 1 % cream Apply to affected skin in the morning 02/28/22   Deirdre EvenerKowalski, Bela C, MD  thiamine 100 MG tablet Take 1 tablet (100 mg total) by mouth daily. 12/25/16   Vassie LollMadera, Carlos, MD  traZODone (DESYREL) 50 MG tablet Take 50 mg by mouth at bedtime.    [provider]  vitamin B-12 1000 MCG tablet Take 1 tablet (1,000 mcg total) by mouth daily. 12/25/16   Vassie LollMadera, Carlos, MD      Allergies    Patient has no known allergies.    Review of Systems   Review of Systems  Unable to perform ROS: Mental status change    Physical Exam Updated Vital Signs BP (!) 138/93   Pulse 64   Temp 98.2 F (36.8 C) (Oral)   Resp 11   SpO2 100%  Physical Exam Vitals and nursing note reviewed.  Constitutional:      General: He is not in acute distress.    Appearance: He is well-developed. He is not toxic-appearing or diaphoretic.  HENT:     Head: Normocephalic and atraumatic.     Right Ear: External ear normal.     Left Ear: External ear normal.     Nose: Nose normal.  Mouth/Throat:     Mouth: Mucous membranes are moist.     Pharynx: Oropharynx is clear.  Eyes:     Extraocular Movements: Extraocular movements intact.     Conjunctiva/sclera: Conjunctivae normal.  Cardiovascular:     Rate and Rhythm: Normal rate and regular rhythm.     Heart sounds: No murmur heard. Pulmonary:     Effort: Pulmonary effort is normal. No respiratory distress.     Breath sounds: Normal breath sounds. No wheezing or rales.  Abdominal:     General: There is no distension.     Palpations: Abdomen is soft.     Tenderness: There is no abdominal tenderness.  Musculoskeletal:        General: No swelling. Normal range of motion.     Cervical back: Normal range of motion and neck supple. No rigidity.     Right lower leg: No edema.     Left lower leg: No edema.   Skin:    General: Skin is warm and dry.     Capillary Refill: Capillary refill takes less than 2 seconds.     Coloration: Skin is not jaundiced or pale.     Comments: Age-indeterminate abrasions to face and knees  Neurological:     General: No focal deficit present.     Mental Status: He is alert.     GCS: GCS eye subscore is 4. GCS verbal subscore is 4. GCS motor subscore is 6.     Cranial Nerves: No facial asymmetry.     Motor: Motor function is intact.  Psychiatric:        Speech: Speech is slurred.     ED Results / Procedures / Treatments   Labs (all labs ordered are listed, but only abnormal results are displayed) Labs Reviewed  COMPREHENSIVE METABOLIC PANEL - Abnormal; Notable for the following components:      Result Value   Glucose, Bld 103 (*)    AST 14 (*)    All other components within normal limits  ETHANOL - Abnormal; Notable for the following components:   Alcohol, Ethyl (B) 259 (*)    All other components within normal limits  URINALYSIS, ROUTINE W REFLEX MICROSCOPIC - Abnormal; Notable for the following components:   Color, Urine STRAW (*)    Specific Gravity, Urine 1.004 (*)    All other components within normal limits  RAPID URINE DRUG SCREEN, HOSP PERFORMED - Abnormal; Notable for the following components:   Benzodiazepines POSITIVE (*)    All other components within normal limits  CBC  MAGNESIUM  CBG MONITORING, ED    EKG None  Radiology DG Hips Bilat W or Wo Pelvis 3-4 Views  Result Date: 05/08/2022 CLINICAL DATA:  Fall with hip pain. EXAM: DG HIP (WITH OR WITHOUT PELVIS) 3-4V BILAT; CHEST  1 VIEW COMPARISON:  Chest PA Lat 09/11/2013 AP pelvis and bilateral hips dated 08/31/2021 AP pelvis 09/22/2015. FINDINGS: Chest: The heart silhouette has become moderately enlarged since the prior study. A widened superior mediastinum is noted most likely positional or technique related and/or due to lipomatosis. There is perihilar vascular prominence which  could be positional or technique related with no overt findings of pulmonary edema. No focal lung infiltrate or pleural effusion is seen, no pneumothorax or displaced rib fractures. Mild thoracic dextroscoliosis. AP pelvis and bilateral hip views: There is normal bone mineralization. There are advanced degenerative changes of the visualized lower lumbar spine. There is no AP evidence of pelvic fracture or diastasis with the  SI joints and pubic symphysis unremarkable, as visualized. Four pins are again noted drilled through the right femoral neck into the head and 3 on the left. Again the left hip pins appear to extend through the flattened femoral head a short distance into the acetabular bone which was seen on both of the prior studies. 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 There is exuberant osteophyte formation about both hips with bone-on-bone joint space loss. No proximal femoral fracture is seen. IMPRESSION: 1. Cardiomegaly developed since 2014 chest study, with central vascular prominence versus vascular exaggeration related to positioning and technique. There are no findings of pulmonary edema or acute pneumonia. 2. 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 3. Advanced degenerative changes of the visualized lower lumbar spine. Electronically Signed   By: Almira Bar M.D.   On: 05/08/2022 20:15   DG Chest 1 View  Result Date: 05/08/2022 CLINICAL DATA:  Fall with hip pain. EXAM: DG HIP (WITH OR WITHOUT PELVIS) 3-4V BILAT; CHEST  1 VIEW COMPARISON:  Chest PA Lat 09/11/2013 AP pelvis and  bilateral hips dated 08/31/2021 AP pelvis 09/22/2015. FINDINGS: Chest: The heart silhouette has become moderately enlarged since the prior study. A widened superior mediastinum is noted most likely positional or technique related and/or due to lipomatosis. There is perihilar vascular prominence which could be positional or technique related with no overt findings of pulmonary edema. No focal lung infiltrate or pleural effusion is seen, no pneumothorax or displaced rib fractures. Mild thoracic dextroscoliosis. AP pelvis and bilateral hip views: There is normal bone mineralization. There are advanced degenerative changes of the visualized lower lumbar spine. There is no AP evidence of pelvic fracture or diastasis with the SI joints and pubic symphysis unremarkable, as visualized. Four pins are again noted drilled through the right femoral neck into the head and 3 on the left. Again the left hip pins appear to extend through the flattened femoral head a short distance into the acetabular bone which was seen on both of the prior studies. 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 There is exuberant osteophyte formation about both hips with bone-on-bone joint space loss. No proximal femoral fracture is seen. IMPRESSION: 1. Cardiomegaly developed since 2014 chest study, with central vascular prominence versus vascular exaggeration related to positioning and technique. There are no findings of pulmonary edema or acute pneumonia. 2. 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 3. Advanced degenerative changes of the  visualized lower lumbar spine. Electronically Signed   By: Almira Bar M.D.   On: 05/08/2022 20:15   CT HEAD WO CONTRAST  Result Date: 05/08/2022 CLINICAL DATA:  Trauma EXAM: CT HEAD WITHOUT CONTRAST CT CERVICAL SPINE WITHOUT CONTRAST TECHNIQUE: Multidetector CT imaging of the head and cervical spine was performed following the standard protocol without intravenous contrast. Multiplanar CT image  reconstructions of the cervical spine were also generated. RADIATION DOSE REDUCTION: This exam was performed according to the departmental dose-optimization program which includes automated exposure control, adjustment of the mA and/or kV according to patient size and/or use of iterative reconstruction technique. COMPARISON:  CT head and cervical spine dated August 06, 2016 FINDINGS: CT HEAD FINDINGS Brain: Chronic white matter ischemic change. No evidence of acute infarction, hemorrhage, hydrocephalus, extra-axial collection or mass lesion/mass effect. Vascular: No hyperdense vessel or unexpected calcification. Skull: Normal. Negative for fracture or focal lesion. Sinuses/Orbits: No acute finding. Other: None. CT CERVICAL SPINE FINDINGS Alignment: Normal. Skull base and vertebrae: No acute fracture. No primary bone lesion or focal pathologic process. Soft tissues and spinal canal: No prevertebral fluid or swelling. No visible canal hematoma. Disc levels:  Moderate multilevel degenerative disc disease. Upper chest: Negative. 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 IMPRESSION: 1. No acute intracranial abnormality. 2. No evidence acute cervical spine fracture or traumatic malalignment. Electronically Signed   By: Allegra Lai M.D.   On: 05/08/2022 19:48   CT CERVICAL SPINE WO CONTRAST  Result Date: 05/08/2022 CLINICAL DATA:  Trauma EXAM: CT  HEAD WITHOUT CONTRAST CT CERVICAL SPINE WITHOUT CONTRAST TECHNIQUE: Multidetector CT imaging of the head and cervical spine was performed following the standard protocol without intravenous contrast. Multiplanar CT image reconstructions of the cervical spine were also generated. RADIATION DOSE REDUCTION: This exam was performed according to the departmental dose-optimization program which includes automated exposure control, adjustment of the mA and/or kV according to patient size and/or use of iterative reconstruction technique. COMPARISON:  CT head and cervical spine dated August 06, 2016 FINDINGS: CT HEAD FINDINGS Brain: Chronic white matter ischemic change. No evidence of acute infarction, hemorrhage, hydrocephalus, extra-axial collection or mass lesion/mass effect. Vascular: No hyperdense vessel or unexpected calcification. Skull: Normal. Negative for fracture or focal lesion. Sinuses/Orbits: No acute finding. Other: None. CT CERVICAL SPINE FINDINGS Alignment: Normal. Skull base and vertebrae: No acute fracture. No primary bone lesion or focal pathologic process. Soft tissues and spinal canal: No prevertebral fluid or swelling. No visible canal hematoma. Disc levels:  Moderate multilevel degenerative disc disease. Upper chest: Negative. 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 IMPRESSION: 1. No acute intracranial abnormality. 2. No evidence acute cervical spine fracture or traumatic malalignment. Electronically Signed   By: Allegra Lai M.D.   On: 05/08/2022 19:48    Procedures Procedures    Medications Ordered in ED Medications  lactated ringers bolus 1,000 mL (0 mLs Intravenous Stopped 05/08/22 2015)  thiamine (B-1) injection 100 mg (100 mg Intravenous Given 05/08/22 1850)    ED Course/ Medical Decision Making/ A&P                            Medical Decision Making Amount and/or Complexity of Data Reviewed Labs: ordered. Radiology: ordered.  Risk Prescription drug management.   This patient presents to the ED for concern of fall, this involves an extensive number of treatment options, and is a complaint that carries with it a high risk of complications and morbidity.  The differential diagnosis includes acute injury, intoxication, polypharmacy, CVA   Co morbidities  that complicate the patient evaluation  alcohol abuse, polysubstance abuse, bipolar disorder, HTN, GERD, OSA, COPD, and GI bleeding   Additional history obtained:  Additional history obtained from EMS External records from outside source obtained and reviewed including EMR   Lab Tests:  I Ordered, and personally interpreted labs.  The pertinent results include: Ethanol level of 259 with UDS positive for benzos.  Lab work otherwise unremarkable.   Imaging Studies ordered:  I ordered imaging studies including x-ray of chest and bilateral hips, CT of head and cervical spine I independently visualized and interpreted imaging which showed no acute findings I agree with the radiologist interpretation   Cardiac Monitoring: / EKG:  The patient was maintained on a cardiac monitor.  I personally viewed and interpreted the cardiac monitored which showed an underlying rhythm of: Sinus rhythm   Problem List / ED Course / Critical interventions / Medication management  Patient is a 61 year old male with history of alcohol abuse and polysubstance abuse, presenting for a fall out of his wheelchair in the setting of intoxication.  Patient reportedly endorsed bilateral hip pain to EMS.  This is a chronic condition for him.  On arrival, patient appears intoxicated with slurred speech.  He was placed on bedside cardiac monitor.  IV fluids and thiamine were provided.  Laboratory work-up was initiated and patient underwent imaging of chest, bilateral hips,  cervical spine, and head.  Imaging results showed no acute findings.  Laboratory results are notable for elevated ethanol level as well as positive benzos in his urine.  Patient was kept on bedside cardiac monitor and allowed to metabolize intoxicating substances.  Care of patient was signed out to oncoming ED provider. I ordered medication including IV fluids and thiamine for alcohol intoxication Reevaluation of the patient after these medicines showed that the patient stayed the same I have reviewed the patients home medicines and have made adjustments as needed   Social Determinants of Health:  History of alcohol and polysubstance abuse         Final Clinical Impression(s) / ED Diagnoses Final diagnoses:  Alcoholic intoxication without complication (HCC)  Fall, initial encounter    Rx / DC Orders ED Discharge Orders     None         Gloris Manchester, MD 05/09/22 0040

## 2022-05-09 NOTE — ED Notes (Signed)
Pt had soiled the bed, cleaned the bed and patient, placed on a purewick, readjusted in bed. Given warm blankets. Is still slurring words, is still drowsy.

## 2022-05-09 NOTE — ED Provider Notes (Signed)
  Physical Exam  BP (!) 153/104   Pulse 75   Temp 98.2 F (36.8 C) (Oral)   Resp 14   SpO2 98%   Physical Exam Vitals and nursing note reviewed.  Constitutional:      General: He is not in acute distress.    Appearance: He is well-developed. He is not diaphoretic.  HENT:     Head: Normocephalic and atraumatic.  Cardiovascular:     Rate and Rhythm: Normal rate and regular rhythm.     Heart sounds: No murmur heard.    No friction rub.  Pulmonary:     Effort: Pulmonary effort is normal. No respiratory distress.     Breath sounds: Normal breath sounds. No wheezing or rales.  Abdominal:     General: Bowel sounds are normal. There is no distension.     Palpations: Abdomen is soft.     Tenderness: There is no abdominal tenderness.  Musculoskeletal:        General: Normal range of motion.     Cervical back: Normal range of motion and neck supple.  Skin:    General: Skin is warm and dry.  Neurological:     Mental Status: He is alert and oriented to person, place, and time.     Coordination: Coordination normal.     Procedures  Procedures  ED Course / MDM   Care assumed from Dr. Durwin Nora at shift change.  Patient awaiting sobriety.  He apparently came here heavily intoxicated.  Patient is now awake, alert, and talking.  I feel as though patient can safely be discharged.      Geoffery Lyons, MD 05/09/22 0600

## 2022-05-09 NOTE — Discharge Instructions (Signed)
Follow up with your primary doctor °

## 2022-06-11 ENCOUNTER — Ambulatory Visit: Payer: Medicaid Other | Admitting: Physical Therapy

## 2022-06-14 ENCOUNTER — Encounter: Payer: Self-pay | Admitting: Physical Therapy

## 2022-06-14 ENCOUNTER — Encounter: Payer: Medicaid Other | Admitting: Physical Therapy

## 2022-06-14 ENCOUNTER — Ambulatory Visit: Payer: Medicaid Other | Attending: Physician Assistant | Admitting: Physical Therapy

## 2022-06-14 DIAGNOSIS — M25551 Pain in right hip: Secondary | ICD-10-CM | POA: Diagnosis present

## 2022-06-14 DIAGNOSIS — R262 Difficulty in walking, not elsewhere classified: Secondary | ICD-10-CM

## 2022-06-14 DIAGNOSIS — M25651 Stiffness of right hip, not elsewhere classified: Secondary | ICD-10-CM | POA: Diagnosis present

## 2022-06-14 DIAGNOSIS — R293 Abnormal posture: Secondary | ICD-10-CM | POA: Diagnosis present

## 2022-06-14 NOTE — Therapy (Signed)
OUTPATIENT PHYSICAL THERAPY EVALUATION   Patient Name: Johnny Banks MRN: [id number] DOB:January 19, 1961, 61 y.o., male Today's Date: 06/14/2022   PT End of Session - 06/14/22 1928     Visit Number 1    Number of Visits 15    Date for PT Re-Evaluation 09/06/22    Authorization Type MEDICAID [place] ACCESS reporting period from 06/14/2022    Authorization Time Period max combined 27 PT/OT/SLP per calendar year, Berkley Harvey is required    Authorization - Visit Number 1    Authorization - Number of Visits 1    Progress Note Due on Visit 10    PT Start Time 0955    PT Stop Time 1032    PT Time Calculation (min) 37 min    Activity Tolerance Patient tolerated treatment well    Behavior During Therapy WFL for tasks assessed/performed             Past Medical History:  Diagnosis Date   Bipolar 1 disorder (HCC)    Hypertension    TBI (traumatic brain injury) [facility])    Past Surgical History:  Procedure Laterality Date   APPENDECTOMY     ESOPHAGOGASTRODUODENOSCOPY N/A 12/20/2016   Procedure: ESOPHAGOGASTRODUODENOSCOPY (EGD);  Surgeon: Bernette Redbird, MD;  Location: [facility];  Service: Endoscopy;  Laterality: N/A;   HIP FRACTURE SURGERY     Patient Active Problem List   Diagnosis Date Noted   Bilateral shoulder pain 01/04/2017   Physical deconditioning    Symptomatic anemia    Tobacco abuse    Melena 12/19/2016   Acute blood loss anemia 12/19/2016   ETOH abuse 12/19/2016   UGIB (upper gastrointestinal bleed) 12/19/2016   Hypotension, unspecified 04/04/2013   Sepsis (HCC) 04/02/2013   Bipolar affective disorder (HCC) 04/02/2013   Polysubstance abuse (HCC) 04/02/2013   HTN (hypertension) 04/02/2013   Syncope 04/02/2013   Acute kidney injury (HCC) 04/02/2013    PCP: Fleet Contras, MD  REFERRING PROVIDER: Arnetha Massy Shilt, PA-C  REFERRING DIAG: other secondary osteoarthritis of right hip  THERAPY DIAG:  Pain in right hip  Stiffness of right hip, not elsewhere  classified  Abnormal posture  Difficulty in walking, not elsewhere classified  Rationale for Evaluation and Treatment: Rehabilitation  ONSET DATE: R THA 05/28/2022  SUBJECTIVE:   SUBJECTIVE STATEMENT: Patient states in the 1970s the cartilage in his hip joints deteriorated so the bones started to come out of his sockets. He had pins placed there which restricted the movement in both his hips. He has not been able to sit without his legs crossed since he was 61 years old when he had that hip surgery done. Eventually his doctor told him eventually the pins would not do any good any more. Originally he was going to get his L hip replaced first because he fell and fractured his L hip a little bit. However, after the fracture he was favoring his right hip and it started to hurt a lot and the pins were bent out of place. He underwent R THA posterior approach on 05/28/2022. He states the fracture in the left hip healed and he still needs to get that side replaced but it does not hurt current. He has lived in a group home for the last 5 years and 4 months. He states he is having trouble crossing his right foot over his left. Today was the first time he was able to get his shoes and socks on, and it took a lot of work. His physical condition worsened  and he gained weight while at the group home and his transportation has mostly in a wheelchair. He pushes his own w/c in the house and also pushes himself to a [place] about a half a mile away. He arrives with a RW and tries to do 3 laps up and down the hallway 3 times a day since his surgery. He recalls no precautions from his doctor for his hip surgery, but does recall some bed exercises. He states he has had multiple back surgeries. He states last week his R lower leg was extremely swollen, so he has been keeping his legs elevated. Last night he fell asleep in his wheelchair. He is on a blood thinner.   PERTINENT HISTORY: Patient is a 61 y.o. male who  presents to outpatient physical therapy with a referral for medical diagnosis other secondary osteoarthritis of right hip. This patient's chief complaints consist of bilateral hip stiffness and pain, stooped posture, difficulty with mobility S/P R THA posterior approach 05/28/2022 leading to the following functional deficits: difficulty with usual activities including household and community ambulation, transfers, and bed mobility, standing, putting R shoe on, stairs, squatting, preventing/treating genital rash his doctor told him was due to not being able to get up and walk around enough, mobility without falling, etc. Relevant past medical history and comorbidities include current smoker, smoker's cough, HTN, ETOH abuse, bipolar affective disorder, bipolar I disorder, polysubstance abuse, syncope, physical deconditioning, B shoulder pain (patient reports he needs both replaced), appendectomy, L hip pinning and chronic stiffness in B hips, prior R hip pinning, B hip dysfunciton since childhood, low back pain, TBI. Patient denies hx of cancer, stroke, seizures, heart problems, diabetes, unexplained weight loss, unexplained changes in bowel or bladder problems, unexplained stumbling or dropping things, osteoporosis, and spinal surgery  PAIN:  Are you having pain? Not currently: NPRS scale: Current: 0/10,  Best: 0/10, Worst: 6/10. Pain location: right lateral hip Pain description: fleeting pain Aggravating factors: moving a certain way, standing up and twisting.  Relieving factors: not triggering pain with certain movements   FUNCTIONAL LIMITATIONS: difficulty with usual activities including household and community ambulation, transfers, and bed mobility, standing, putting R shoe on, stairs, squatting, preventing/treating genital rash his doctor told him was due to not being able to get up and walk around enough, mobility without falling, etc.   PRECAUTIONS: Fall  WEIGHT BEARING RESTRICTIONS No  FALLS:   Has patient fallen in last 6 months? Yes. Number of falls 1 rolled off the bed one night.   LIVING ENVIRONMENT: Lives with:  5 other adults/clients and nurses aide/staff Lives in: Group home Stairs: No, has a ramp Has following equipment at home: Gilford Rile - 2 wheeled, Wheelchair (manual), shower chair, Grab bars, and tub/shower, toilet riser with grab bars.  OCCUPATION: disability  PLOF:  needed help with meals and medications, transportation. I with basic mobility using W/C and RW, dressing himself. Lives in group home. Before his hips got worse reports he lifted weights and was completely I with ambulation and  mobility.  PATIENT GOALS to be able to sit like a normal person, cross my legs like a normal person, just be normal, stand up straighter, be able to do a full squat   OBJECTIVE:   DIAGNOSTIC FINDINGS:  B hip xray report from 05/08/2022:  CLINICAL DATA:  Fall with hip pain.   EXAM: DG HIP (WITH OR WITHOUT PELVIS) 3-4V BILAT; CHEST  1 VIEW   COMPARISON:  Chest PA Lat 09/11/2013 AP pelvis  and bilateral hips dated 08/31/2021 AP pelvis 09/22/2015.   FINDINGS: Chest:   The heart silhouette has become moderately enlarged since the prior study. A widened superior mediastinum is noted most likely positional or technique related and/or due to lipomatosis.   There is perihilar vascular prominence which could be positional or technique related with no overt findings of pulmonary edema. No focal lung infiltrate or pleural effusion is seen, no pneumothorax or displaced rib fractures. Mild thoracic dextroscoliosis.   AP pelvis and bilateral hip views:   There is normal bone mineralization. There are advanced degenerative changes of the visualized lower lumbar spine.   There is no AP evidence of pelvic fracture or diastasis with the SI joints and pubic symphysis unremarkable, as visualized.   Four pins are again noted drilled through the right femoral neck into the head and 3  on the left.   Again the left hip pins appear to extend through the flattened femoral head a short distance into the acetabular bone which was seen on both of the prior studies.   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   There is exuberant osteophyte formation about both hips with bone-on-bone joint space loss. No proximal femoral fracture is seen.   IMPRESSION: 1. Cardiomegaly developed since 2014 chest study, with central vascular prominence versus vascular exaggeration related to positioning and technique. There are no findings of pulmonary edema or acute pneumonia. 2. 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 3. Advanced degenerative changes of the visualized lower lumbar spine.     Electronically Signed   By: Almira Bar M.D.   On: 05/08/2022 20:15   SELF- REPORTED FUNCTION FOTO score: 43/100 (hip questionnaire)  OBSERVATION/INSPECTION Posture Posture (standing): B UE support on RW, stooped ~ 20 degrees at hip/back, weigth shifted away from R LE, left toe out. Anthropometrics Tremor: none Body composition: BMI 33.6 Skin: The incision site not visualized, pt reports no concerns. Edema: B LE edema, R > L and pitting on R.  Functional Mobility Bed mobility: supine <> sit and rolling mod I due to B hip weakness, stiffness, pain, and need for increased time/effort. Transfers: sit <> stand mod I with B UE support from surface and on RW.  Gait: Ambulates with RW in very stooped posture with wide base of support, L toe out, lacks bilateral hip  extension.  NEUROLOGICAL Dermatomes L2-S2 appears equal and intact to light touch except the following: L4 slightly diminished on right compared to left.   PERIPHERAL JOINT MOTION (in degrees)  PASSIVE RANGE OF MOTION (PROM) *Indicates pain 06/14/22 Date Date  Joint/Motion R/L R/L R/L  Hip     Flexion  110/110 / /  Extension  -20/-35 / /  Comments:  06/14/2022: L hip approx - 10 degrees IR in sitting, no ER limitation. R hip IR/ER in seated position limited to approx 20 degrees ER and 10 degrees IR. B knees and ankles appear grossly [facility] for basic mobility.   MUSCLE PERFORMANCE (MMT):  *Indicates pain 06/14/22 Date Date  Joint/Motion R/L R/L R/L  Hip     Flexion (L1, L2) 2/2 / /  Abduction 2/1 / /  Knee     Extension (L3) 5/5 / /  Flexion (S2) 5/5 / /  Ankle/Foot     Dorsiflexion (L4) 5/5 / /  Great toe extension (L5) 4+/4+ / /  Eversion (S1) 5/5 / /  Plantarflexion (S1) 4/4 / /  Comments:  06/14/22: Unable to extend either hip to neutral for hip abduction testing.   FUNCTIONAL/BALANCE TESTS: Five Time Sit to Stand (5TSTS): 54 seconds from 18.5 inch plinth with B UE support on plinth and RW in front.   TODAY'S TREATMENT: education   PATIENT EDUCATION:  Education details: Education on diagnosis, prognosis, POC, anatomy and physiology of current condition.  Person educated: Patient Education method: Explanation Education comprehension: verbalized understanding and needs further education   HOME EXERCISE PROGRAM: TBD  ASSESSMENT:  CLINICAL IMPRESSION: Patient is a 61 y.o. male referred to outpatient physical therapy with a medical diagnosis of other secondary osteoarthritis of right hip who presents with signs and symptoms consistent with right hip stiffness, weakness, and dysfunction S/P R THA posterior approach 05/28/2022. Patient's presentation is complicated by chronic B hip problems resulting in pinning of both hips since he was 61 years old that restricted B hip ROM  and continues to restrict L hip ROM and cause pain. 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 His recent R THA improves his potential for normalized ROM, strength, and function of R hip but his function will likely continue to be limited by L hip which is still pinned with limited potential ROM available. Patient's rehab will also likely be affected by chronic back pain, B shoulder pain/dysfunction and his history of ETOH and multi-substance abuse, bipolar disorder, and dependence on others for transportation. Patient presents with significant pain, ROM, tissue integrity, posture, gait, balance, muscle performance (strength/power/endurance) and activity tolerance impairments that are limiting ability to complete his usual activities including household and community ambulation, transfers, and bed mobility, standing, putting R shoe on, stairs, squatting, preventing/treating genital rash his doctor told him was due to not being able to get up and walk around enough, mobility without falling, etc without difficulty. Patient will benefit from skilled physical therapy intervention to address current body structure impairments and activity limitations to improve function and work towards goals set in current POC in order to return to prior level of function or maximal functional improvement.   OBJECTIVE IMPAIRMENTS Abnormal gait, decreased activity tolerance, decreased balance, decreased endurance, decreased knowledge of condition, decreased knowledge of use of DME, decreased mobility, difficulty walking, decreased ROM, decreased strength, hypomobility, increased edema, increased fascial restrictions, impaired perceived functional ability, impaired flexibility, impaired sensation, impaired UE functional use, improper body mechanics, postural dysfunction, obesity, and pain.   ACTIVITY LIMITATIONS carrying, lifting, bending, standing, squatting, stairs,  transfers, bed mobility, bathing, toileting, dressing, hygiene/grooming, and locomotion level  PARTICIPATION LIMITATIONS: meal prep, cleaning, laundry, shopping, community activity, and   usual activities including household and community ambulation, transfers, and bed mobility, standing, putting R shoe on, stairs, squatting, preventing/treating genital rash his doctor told him was due to not being able to get up and walk around enough, mobility without falling, etc  PERSONAL FACTORS Fitness, Past/current experiences, Time since onset of injury/illness/exacerbation, Transportation, and 3+ comorbidities:   current smoker, smoker's cough, HTN, ETOH abuse, bipolar affective disorder, bipolar I disorder, polysubstance abuse, syncope, physical deconditioning, B shoulder pain (patient reports he needs both replaced), appendectomy, L hip pinning and chronic stiffness in B hips, prior R hip  pinning, B hip dysfunciton since childhood, low back pain, TBI are also affecting patient's functional outcome.   REHAB POTENTIAL: Fair see comorbid conditions  CLINICAL DECISION MAKING: Evolving/moderate complexity  EVALUATION COMPLEXITY: Moderate   GOALS: Goals reviewed with patient? No  SHORT TERM GOALS: Target date: 06/28/2022  Patient will be independent with initial home exercise program for self-management of symptoms. Baseline: Initial HEP to be provided at visit 2 as appropriate (06/14/22); Goal status: INITIAL   LONG TERM GOALS: Target date: 09/06/2022  Patient will be independent with a long-term home exercise program for self-management of symptoms.  Baseline: Initial HEP to be provided at visit 2 as appropriate (06/14/22); Goal status: INITIAL  2.  Patient will demonstrate improved FOTO to equal or greater than 52 by visit #13 to demonstrate improvement in overall condition and self-reported functional ability.  Baseline: 43 (06/14/22); Goal status: INITIAL  3.  Patient will demonstrate R hip  PROM extension to 20 degrees to improve his ability to stand and walk with upright posture.  Baseline:  lacking 20 degrees (06/14/22); Goal status: INITIAL  4.  Patient will demonstrate L hip abduction strength 4+/5 to improve his ability to ambulate with a less restrictive assistive device, such as a SPC. Baseline: 2/5 (06/14/22); Goal status: INITIAL  5.  Patient will ambulate 1000 feet with mod I using LRAD during 6 Minute Walk Test to demonstrate improved community and household mobility.  Baseline: to be tested visit 2 as appropriate (06/14/22); Goal status: INITIAL  6.  Patient will complete 5 Time Sit to Stand test in equal or less than 12 seconds from 18.5 inch plinth with no UE support to improve his fall risk and ability to transfer safely in his home.  Baseline: 54 seconds from 18.5 inch plinth with B UE support on plinth and RW in front. (06/14/2022); Goal status: INITIAL    PLAN: PT FREQUENCY: 1-2x/week  PT DURATION: 12 weeks  PLANNED INTERVENTIONS: Therapeutic exercises, Therapeutic activity, Neuromuscular re-education, Balance training, Gait training, Patient/Family education, Stair training, DME instructions, Electrical stimulation, Spinal mobilization, Cryotherapy, Moist heat, scar mobilization, Manual therapy, and Re-evaluation  PLAN FOR NEXT SESSION: update HEP as appropriate, complete 6MWT, interventions for improving hip ROM, LE and functional strength and balance exercises as appropriate.    [name]. [name], PT, DPT 06/14/22, 8:07 PM  [facility] [address] [place], [place] [address] P: [phone] I F: [phone]

## 2022-06-18 ENCOUNTER — Encounter: Payer: Self-pay | Admitting: Physical Therapy

## 2022-06-18 ENCOUNTER — Ambulatory Visit: Payer: Medicaid Other | Admitting: Physical Therapy

## 2022-06-18 VITALS — BP 134/86 | HR 87

## 2022-06-18 DIAGNOSIS — R293 Abnormal posture: Secondary | ICD-10-CM

## 2022-06-18 DIAGNOSIS — M25551 Pain in right hip: Secondary | ICD-10-CM

## 2022-06-18 DIAGNOSIS — M25651 Stiffness of right hip, not elsewhere classified: Secondary | ICD-10-CM

## 2022-06-18 DIAGNOSIS — R262 Difficulty in walking, not elsewhere classified: Secondary | ICD-10-CM

## 2022-06-18 NOTE — Therapy (Addendum)
OUTPATIENT PHYSICAL THERAPY TREATMENT NOTE   Patient Name: Johnny Banks MRN: [id number] DOB:Sep 29, 1961, 61 y.o., male Today's Date: 06/18/2022  PCP: Fleet Contras, MD REFERRING PROVIDER: Georgann Housekeeper, PA-C  END OF SESSION:   PT End of Session - 06/18/22 0958     Visit Number 2    Number of Visits 15    Date for PT Re-Evaluation 09/06/22    Authorization Type MEDICAID [place] ACCESS reporting period from 06/14/2022    Authorization Time Period max combined 27 PT/OT/SLP per calendar year, auth is required    Authorization - Visit Number 2    Authorization - Number of Visits 1    Progress Note Due on Visit 10    PT Start Time [id number]    PT Stop Time 1031    PT Time Calculation (min) 38 min    Activity Tolerance Patient tolerated treatment well    Behavior During Therapy WFL for tasks assessed/performed             Past Medical History:  Diagnosis Date   Bipolar 1 disorder (HCC)    Hypertension    TBI (traumatic brain injury) [facility])    Past Surgical History:  Procedure Laterality Date   APPENDECTOMY     ESOPHAGOGASTRODUODENOSCOPY N/A 12/20/2016   Procedure: ESOPHAGOGASTRODUODENOSCOPY (EGD);  Surgeon: Bernette Redbird, MD;  Location: [facility];  Service: Endoscopy;  Laterality: N/A;   HIP FRACTURE SURGERY     Patient Active Problem List   Diagnosis Date Noted   Bilateral shoulder pain 01/04/2017   Physical deconditioning    Symptomatic anemia    Tobacco abuse    Melena 12/19/2016   Acute blood loss anemia 12/19/2016   ETOH abuse 12/19/2016   UGIB (upper gastrointestinal bleed) 12/19/2016   Hypotension, unspecified 04/04/2013   Sepsis (HCC) 04/02/2013   Bipolar affective disorder (HCC) 04/02/2013   Polysubstance abuse (HCC) 04/02/2013   HTN (hypertension) 04/02/2013   Syncope 04/02/2013   Acute kidney injury (HCC) 04/02/2013    REFERRING DIAG: other secondary osteoarthritis of right hip  THERAPY DIAG:  Pain in right hip  Stiffness of right hip, not  elsewhere classified  Abnormal posture  Difficulty in walking, not elsewhere classified  Rationale for Evaluation and Treatment: Rehabilitation  PERTINENT HISTORY: Patient is a 61 y.o. male who presents to outpatient physical therapy with a referral for medical diagnosis other secondary osteoarthritis of right hip. This patient's chief complaints consist of bilateral hip stiffness and pain, stooped posture, difficulty with mobility S/P R THA posterior approach 05/28/2022 leading to the following functional deficits: difficulty with usual activities including household and community ambulation, transfers, and bed mobility, standing, putting R shoe on, stairs, squatting, preventing/treating genital rash his doctor told him was due to not being able to get up and walk around enough, mobility without falling, etc. Relevant past medical history and comorbidities include current smoker, smoker's cough, HTN, ETOH abuse, bipolar affective disorder, bipolar I disorder, polysubstance abuse, syncope, physical deconditioning, B shoulder pain (patient reports he needs both replaced), appendectomy, L hip pinning and chronic stiffness in B hips, prior R hip pinning, B hip dysfunciton since childhood, low back pain, TBI. Patient denies hx of cancer, stroke, seizures, heart problems, diabetes, unexplained weight loss, unexplained changes in bowel or bladder problems, unexplained stumbling or dropping things, osteoporosis, and spinal surgery  PRECAUTIONS: Fall  SUBJECTIVE: Patient arrives on RW. States his low back is bothering him today when he is standing.   PAIN:  Are you having pain? None  while seated. 5/10 in his low back when he stands up.    OBJECTIVE:   Vitals:   06/18/22 0959  BP: 134/86  Pulse: 87  SpO2: 99%    FUNCTIONAL/BALANCE TESTS: 6 Min Walk Test: 305 feet with RW, no rests.    TODAY'S TREATMENT: Therapeutic exercise: to centralize symptoms and improve ROM, strength, muscular endurance,  and activity tolerance required for successful completion of functional activities.  - vitals check prior to exercise (See above).  - ambulation around clinic for distance in 6 min (see 6 Minute Walk Test above).  - sit <> stand from armed chair to RW, 1x10 with attempt to stand up tall.  - seated hip flexor stretch, 3x30 seconds on each side.  - supine hip flexor 2x30 seconds right side, 1x30 seconds left side.  - hooklying LTR, 1x5 each direction.  - Education on HEP including handout   Pt required multimodal cuing for proper technique and to facilitate improved neuromuscular control, strength, range of motion, and functional ability resulting in improved performance and form.    PATIENT EDUCATION:  Education details: Exercise form/purpose. HEP. Reviewed cancelation/no-show policy with patient and confirmed patient has correct phone number for clinic; patient verbalized understanding (06/18/22). Person educated: Patient Education method: Explanation, demonstration, handout Education comprehension: verbalized understanding and needs further education   HOME EXERCISE PROGRAM: Access Code: [email or web address] URL: [email or web address] Date: 06/18/2022 Prepared by: Norton Blizzard  Exercises - Sit to Stand with Counter Support  - 1 x daily - 2 sets - 10 reps - supine hip flexor stretch  - 1 x daily - 3 reps - 30-60 seconds hold - Supine Lower Trunk Rotation  - 1 x daily - 1 sets - 20 reps   ASSESSMENT:   CLINICAL IMPRESSION: Patient tolerated treatment well overall with some discomfort in the low back and bilateral hips. He was provided with initial HEP which he demonstrated the ability to complete safely. He continues to be very limited in standing with upright posture due to B hip flexion contractures. Patient would benefit from continued management of limiting condition by skilled physical therapist to address remaining impairments and functional limitations to work towards stated  goals and return to PLOF or maximal functional independence.   Patient is a 61 y.o. male referred to outpatient physical therapy with a medical diagnosis of other secondary osteoarthritis of right hip who presents with signs and symptoms consistent with right hip stiffness, weakness, and dysfunction S/P R THA posterior approach 05/28/2022. Patient's presentation is complicated by chronic B hip problems resulting in pinning of both hips since he was 61 years old that restricted B hip ROM and continues to restrict L hip ROM and cause pain. 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 His recent R THA improves his potential for normalized ROM, strength, and function of R hip but his function will likely continue to be limited by L hip which is still pinned with limited potential ROM available. Patient's rehab will also likely be affected by chronic back pain, B shoulder pain/dysfunction and his history of ETOH and multi-substance abuse, bipolar disorder, and dependence on others for transportation. Patient presents with significant pain, ROM, tissue integrity, posture, gait, balance, muscle performance (strength/power/endurance) and activity tolerance impairments that are limiting ability to complete his usual activities including household and community ambulation, transfers, and bed mobility, standing, putting R shoe on, stairs, squatting, preventing/treating genital rash his doctor told him was due to not  being able to get up and walk around enough, mobility without falling, etc without difficulty. Patient will benefit from skilled physical therapy intervention to address current body structure impairments and activity limitations to improve function and work towards goals set in current POC in order to return to prior level of function or maximal functional improvement.    OBJECTIVE IMPAIRMENTS Abnormal gait, decreased activity tolerance, decreased  balance, decreased endurance, decreased knowledge of condition, decreased knowledge of use of DME, decreased mobility, difficulty walking, decreased ROM, decreased strength, hypomobility, increased edema, increased fascial restrictions, impaired perceived functional ability, impaired flexibility, impaired sensation, impaired UE functional use, improper body mechanics, postural dysfunction, obesity, and pain.    ACTIVITY LIMITATIONS carrying, lifting, bending, standing, squatting, stairs, transfers, bed mobility, bathing, toileting, dressing, hygiene/grooming, and locomotion level   PARTICIPATION LIMITATIONS: meal prep, cleaning, laundry, shopping, community activity, and   usual activities including household and community ambulation, transfers, and bed mobility, standing, putting R shoe on, stairs, squatting, preventing/treating genital rash his doctor told him was due to not being able to get up and walk around enough, mobility without falling, etc   PERSONAL FACTORS Fitness, Past/current experiences, Time since onset of injury/illness/exacerbation, Transportation, and 3+ comorbidities:   current smoker, smoker's cough, HTN, ETOH abuse, bipolar affective disorder, bipolar I disorder, polysubstance abuse, syncope, physical deconditioning, B shoulder pain (patient reports he needs both replaced), appendectomy, L hip pinning and chronic stiffness in B hips, prior R hip pinning, B hip dysfunciton since childhood, low back pain, TBI are also affecting patient's functional outcome.    REHAB POTENTIAL: Fair see comorbid conditions   CLINICAL DECISION MAKING: Evolving/moderate complexity   EVALUATION COMPLEXITY: Moderate     GOALS: Goals reviewed with patient? No   SHORT TERM GOALS: Target date: 06/28/2022   Patient will be independent with initial home exercise program for self-management of symptoms. Baseline: Initial HEP to be provided at visit 2 as appropriate (06/14/22); initial HEP provided at  visit 2 (06/18/2022);  Goal status: In-progress     LONG TERM GOALS: Target date: 09/06/2022   Patient will be independent with a long-term home exercise program for self-management of symptoms.  Baseline: Initial HEP to be provided at visit 2 as appropriate (06/14/22); initial HEP provided at visit 2 (06/18/2022);  Goal status: In-progress   2.  Patient will demonstrate improved FOTO to equal or greater than 52 by visit #13 to demonstrate improvement in overall condition and self-reported functional ability.  Baseline: 43 (06/14/22); Goal status: In-progress   3.  Patient will demonstrate R hip PROM extension to 20 degrees to improve his ability to stand and walk with upright posture.  Baseline:  lacking 20 degrees (06/14/22); Goal status: In-progress   4.  Patient will demonstrate L hip abduction strength 4+/5 to improve his ability to ambulate with a less restrictive assistive device, such as a SPC. Baseline: 2/5 (06/14/22); Goal status: In-progress   5.  Patient will ambulate 1000 feet with mod I using LRAD during 6 Minute Walk Test to demonstrate improved community and household mobility.  Baseline: to be tested visit 2 as appropriate (06/14/22); 305 feet with RW, no rests (06/18/2022);  Goal status: In-progress   6.  Patient will complete 5 Time Sit to Stand test in equal or less than 12 seconds from 18.5 inch plinth with no UE support to improve his fall risk and ability to transfer safely in his home.  Baseline: 54 seconds from 18.5 inch plinth with B UE support on plinth  and RW in front. (06/14/2022); Goal status: in progress       PLAN: PT FREQUENCY: 1-2x/week   PT DURATION: 12 weeks   PLANNED INTERVENTIONS: Therapeutic exercises, Therapeutic activity, Neuromuscular re-education, Balance training, Gait training, Patient/Family education, Stair training, DME instructions, Electrical stimulation, Spinal mobilization, Cryotherapy, Moist heat, scar mobilization, Manual therapy, and  Re-evaluation   PLAN FOR NEXT SESSION: update HEP as appropriate, complete , interventions for improving hip ROM, LE and functional strength and balance exercises as appropriate.      Cira Rue, PT, DPT 06/18/2022, 1:42 PM  [facility] [address] [place], [place] [address] P: [phone] I F: [phone]

## 2022-06-20 ENCOUNTER — Encounter: Payer: Self-pay | Admitting: Physical Therapy

## 2022-06-20 ENCOUNTER — Ambulatory Visit: Payer: Medicaid Other | Admitting: Physical Therapy

## 2022-06-20 DIAGNOSIS — R293 Abnormal posture: Secondary | ICD-10-CM

## 2022-06-20 DIAGNOSIS — M25551 Pain in right hip: Secondary | ICD-10-CM | POA: Diagnosis not present

## 2022-06-20 DIAGNOSIS — M25651 Stiffness of right hip, not elsewhere classified: Secondary | ICD-10-CM

## 2022-06-20 DIAGNOSIS — R262 Difficulty in walking, not elsewhere classified: Secondary | ICD-10-CM

## 2022-06-20 NOTE — Therapy (Signed)
OUTPATIENT PHYSICAL THERAPY TREATMENT NOTE   Patient Name: Johnny Banks MRN: [id number] DOB:04-02-1961, 61 y.o., male Today's Date: 06/20/2022  PCP: Fleet Contras, MD REFERRING PROVIDER: Georgann Housekeeper, PA-C  END OF SESSION:   PT End of Session - 06/20/22 0911     Visit Number 3    Number of Visits 15    Date for PT Re-Evaluation 09/06/22    Authorization Type MEDICAID [place] ACCESS reporting period from 06/14/2022    Authorization Time Period [employer] auth 8/7-8/15 3 PT visits        (max combined 27 PT/OT/SLP per calendar year, auth is required)    Authorization - Visit Number 2    Authorization - Number of Visits 3    Progress Note Due on Visit 10    PT Start Time 0904    PT Stop Time 0942    PT Time Calculation (min) 38 min    Activity Tolerance Patient tolerated treatment well    Behavior During Therapy [facility] for tasks assessed/performed              Past Medical History:  Diagnosis Date   Bipolar 1 disorder (HCC)    Hypertension    TBI (traumatic brain injury) [facility])    Past Surgical History:  Procedure Laterality Date   APPENDECTOMY     ESOPHAGOGASTRODUODENOSCOPY N/A 12/20/2016   Procedure: ESOPHAGOGASTRODUODENOSCOPY (EGD);  Surgeon: Bernette Redbird, MD;  Location: [facility];  Service: Endoscopy;  Laterality: N/A;   HIP FRACTURE SURGERY     Patient Active Problem List   Diagnosis Date Noted   Bilateral shoulder pain 01/04/2017   Physical deconditioning    Symptomatic anemia    Tobacco abuse    Melena 12/19/2016   Acute blood loss anemia 12/19/2016   ETOH abuse 12/19/2016   UGIB (upper gastrointestinal bleed) 12/19/2016   Hypotension, unspecified 04/04/2013   Sepsis (HCC) 04/02/2013   Bipolar affective disorder (HCC) 04/02/2013   Polysubstance abuse (HCC) 04/02/2013   HTN (hypertension) 04/02/2013   Syncope 04/02/2013   Acute kidney injury (HCC) 04/02/2013    REFERRING DIAG: other secondary osteoarthritis of right hip  THERAPY DIAG:  Pain in  right hip  Stiffness of right hip, not elsewhere classified  Abnormal posture  Difficulty in walking, not elsewhere classified  Rationale for Evaluation and Treatment: Rehabilitation  PERTINENT HISTORY: Patient is a 61 y.o. male who presents to outpatient physical therapy with a referral for medical diagnosis other secondary osteoarthritis of right hip. This patient's chief complaints consist of bilateral hip stiffness and pain, stooped posture, difficulty with mobility S/P R THA posterior approach 05/28/2022 leading to the following functional deficits: difficulty with usual activities including household and community ambulation, transfers, and bed mobility, standing, putting R shoe on, stairs, squatting, preventing/treating genital rash his doctor told him was due to not being able to get up and walk around enough, mobility without falling, etc. Relevant past medical history and comorbidities include current smoker, smoker's cough, HTN, ETOH abuse, bipolar affective disorder, bipolar I disorder, polysubstance abuse, syncope, physical deconditioning, B shoulder pain (patient reports he needs both replaced), appendectomy, L hip pinning and chronic stiffness in B hips, prior R hip pinning, B hip dysfunciton since childhood, low back pain, TBI. Patient denies hx of cancer, stroke, seizures, heart problems, diabetes, unexplained weight loss, unexplained changes in bowel or bladder problems, unexplained stumbling or dropping things, osteoporosis, and spinal surgery  PRECAUTIONS: Fall  SUBJECTIVE: Patient arrives on RW. States he is feeling sore this morning.  He describes the feeling as "kind of numb" from the waist down. States his low back is bothering him the  most. State she was sore after last PT session and from his HEP.   PAIN:  Are you having pain? None while seated. 2/10 in his low back and B LE.    OBJECTIVE:    TODAY'S TREATMENT: Therapeutic exercise: to centralize symptoms and improve  ROM, strength, muscular endurance, and activity tolerance required for successful completion of functional activities.  - NuStep level 1 using bilateral lower extremities. Seat setting  12. For improved extremity mobility, muscular endurance, and activity tolerance; and to induce the analgesic effect of aerobic exercise, stimulate improved joint nutrition, and prepare body structures and systems for following interventions. x 6  minutes. Average SPM = 30 (cued to try to get above 50 spm). - step up to 6 inch step at bottom of stair, leading up with right and down with left, 2x10. Has difficulty flexing R hip, heavy UE use.  - hooklying lower trunk rotation, 2 min at self selected pace.  - supine hip flexor 3x30 seconds each side with contralateral hip flexion AAROM assisted by B UE using towel behind knee and PT support to decrease pull on low back and improve effectiveness of stretch.  - supine to prone rolling with min A and cuing for arm placement - prone lying for 3 min  to stretch back and hips - prone to quadruped to standing with guarding and cuing  Pt required multimodal cuing for proper technique and to facilitate improved neuromuscular control, strength, range of motion, and functional ability resulting in improved performance and form.    PATIENT EDUCATION:  Education details: Exercise form/purpose. HEP. Reviewed cancelation/no-show policy with patient and confirmed patient has correct phone number for clinic; patient verbalized understanding (06/18/22). Person educated: Patient Education method: Explanation, demonstration, handout Education comprehension: verbalized understanding and needs further education   HOME EXERCISE PROGRAM: Access Code: [email or web address] URL: [email or web address] Date: 06/18/2022 Prepared by: Norton Blizzard  Exercises - Sit to Stand with Counter Support  - 1 x daily - 2 sets - 10 reps - supine hip flexor stretch  - 1 x daily - 3 reps - 30-60 seconds  hold - Supine Lower Trunk Rotation  - 1 x daily - 1 sets - 20 reps   ASSESSMENT:   CLINICAL IMPRESSION: Patient tolerated treatment well overall but continued with large amount of groaning and vocalization which improved with cuing for breathing and relaxation. Patient continues to be severely limited in upright posture due to bilateral hip flexion contractures and low back stiffness. Plan to update goals and continue working on improving hip ROM and LE and functional strength and balance as appropriate next session. Patient would benefit from continued management of limiting condition by skilled physical therapist to address remaining impairments and functional limitations to work towards stated goals and return to PLOF or maximal functional independence.   Patient is a 61 y.o. male referred to outpatient physical therapy with a medical diagnosis of other secondary osteoarthritis of right hip who presents with signs and symptoms consistent with right hip stiffness, weakness, and dysfunction S/P R THA posterior approach 05/28/2022. Patient's presentation is complicated by chronic B hip problems resulting in pinning of both hips since he was 61 years old that restricted B hip ROM and continues to restrict L hip ROM and cause pain. 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 His recent  R THA improves his potential for normalized ROM, strength, and function of R hip but his function will likely continue to be limited by L hip which is still pinned with limited potential ROM available. Patient's rehab will also likely be affected by chronic back pain, B shoulder pain/dysfunction and his history of ETOH and multi-substance abuse, bipolar disorder, and dependence on others for transportation. Patient presents with significant pain, ROM, tissue integrity, posture, gait, balance, muscle performance (strength/power/endurance) and activity tolerance  impairments that are limiting ability to complete his usual activities including household and community ambulation, transfers, and bed mobility, standing, putting R shoe on, stairs, squatting, preventing/treating genital rash his doctor told him was due to not being able to get up and walk around enough, mobility without falling, etc without difficulty. Patient will benefit from skilled physical therapy intervention to address current body structure impairments and activity limitations to improve function and work towards goals set in current POC in order to return to prior level of function or maximal functional improvement.    OBJECTIVE IMPAIRMENTS Abnormal gait, decreased activity tolerance, decreased balance, decreased endurance, decreased knowledge of condition, decreased knowledge of use of DME, decreased mobility, difficulty walking, decreased ROM, decreased strength, hypomobility, increased edema, increased fascial restrictions, impaired perceived functional ability, impaired flexibility, impaired sensation, impaired UE functional use, improper body mechanics, postural dysfunction, obesity, and pain.    ACTIVITY LIMITATIONS carrying, lifting, bending, standing, squatting, stairs, transfers, bed mobility, bathing, toileting, dressing, hygiene/grooming, and locomotion level   PARTICIPATION LIMITATIONS: meal prep, cleaning, laundry, shopping, community activity, and   usual activities including household and community ambulation, transfers, and bed mobility, standing, putting R shoe on, stairs, squatting, preventing/treating genital rash his doctor told him was due to not being able to get up and walk around enough, mobility without falling, etc   PERSONAL FACTORS Fitness, Past/current experiences, Time since onset of injury/illness/exacerbation, Transportation, and 3+ comorbidities:   current smoker, smoker's cough, HTN, ETOH abuse, bipolar affective disorder, bipolar I disorder, polysubstance abuse,  syncope, physical deconditioning, B shoulder pain (patient reports he needs both replaced), appendectomy, L hip pinning and chronic stiffness in B hips, prior R hip pinning, B hip dysfunciton since childhood, low back pain, TBI are also affecting patient's functional outcome.    REHAB POTENTIAL: Fair see comorbid conditions   CLINICAL DECISION MAKING: Evolving/moderate complexity   EVALUATION COMPLEXITY: Moderate     GOALS: Goals reviewed with patient? No   SHORT TERM GOALS: Target date: 06/28/2022   Patient will be independent with initial home exercise program for self-management of symptoms. Baseline: Initial HEP to be provided at visit 2 as appropriate (06/14/22); initial HEP provided at visit 2 (06/18/2022);  Goal status: In-progress     LONG TERM GOALS: Target date: 09/06/2022   Patient will be independent with a long-term home exercise program for self-management of symptoms.  Baseline: Initial HEP to be provided at visit 2 as appropriate (06/14/22); initial HEP provided at visit 2 (06/18/2022);  Goal status: In-progress   2.  Patient will demonstrate improved FOTO to equal or greater than 52 by visit #13 to demonstrate improvement in overall condition and self-reported functional ability.  Baseline: 43 (06/14/22); Goal status: In-progress   3.  Patient will demonstrate R hip PROM extension to 20 degrees to improve his ability to stand and walk with upright posture.  Baseline:  lacking 20 degrees (06/14/22); Goal status: In-progress   4.  Patient will demonstrate L hip abduction strength 4+/5 to improve his ability to  ambulate with a less restrictive assistive device, such as a SPC. Baseline: 2/5 (06/14/22); Goal status: In-progress   5.  Patient will ambulate 1000 feet with mod I using LRAD during 6 Minute Walk Test to demonstrate improved community and household mobility.  Baseline: to be tested visit 2 as appropriate (06/14/22); 305 feet with RW, no rests (06/18/2022);  Goal  status: In-progress   6.  Patient will complete 5 Time Sit to Stand test in equal or less than 12 seconds from 18.5 inch plinth with no UE support to improve his fall risk and ability to transfer safely in his home.  Baseline: 54 seconds from 18.5 inch plinth with B UE support on plinth and RW in front. (06/14/2022); Goal status: in progress       PLAN: PT FREQUENCY: 1-2x/week   PT DURATION: 12 weeks   PLANNED INTERVENTIONS: Therapeutic exercises, Therapeutic activity, Neuromuscular re-education, Balance training, Gait training, Patient/Family education, Stair training, DME instructions, Electrical stimulation, Spinal mobilization, Cryotherapy, Moist heat, scar mobilization, Manual therapy, and Re-evaluation   PLAN FOR NEXT SESSION: update HEP as appropriate, interventions for improving hip ROM, LE and functional strength and balance exercises as appropriate.      Cira Rue, PT, DPT 06/20/2022, 10:29 AM  [facility] [address] [place], [place] [address] P: [phone] I F: [phone]

## 2022-06-25 ENCOUNTER — Ambulatory Visit: Payer: Medicaid Other | Admitting: Physical Therapy

## 2022-06-25 ENCOUNTER — Encounter: Payer: Self-pay | Admitting: Physical Therapy

## 2022-06-25 DIAGNOSIS — M25551 Pain in right hip: Secondary | ICD-10-CM

## 2022-06-25 DIAGNOSIS — R262 Difficulty in walking, not elsewhere classified: Secondary | ICD-10-CM

## 2022-06-25 DIAGNOSIS — R293 Abnormal posture: Secondary | ICD-10-CM

## 2022-06-25 DIAGNOSIS — M25651 Stiffness of right hip, not elsewhere classified: Secondary | ICD-10-CM

## 2022-06-25 NOTE — Therapy (Signed)
OUTPATIENT PHYSICAL THERAPY TREATMENT NOTE / PROGRESS NOTE Dates of reporting from 06/14/2022 to 06/25/2022   Patient Name: Johnny Banks MRN: [id number] DOB:11/14/1960, 61 y.o., male Today's Date: 06/25/2022  PCP: Nolene Ebbs, MD REFERRING PROVIDER: Laure Kidney, PA-C  END OF SESSION:   PT End of Session - 06/25/22 2114     Visit Number 4    Number of Visits 15    Date for PT Re-Evaluation 09/06/22    Authorization Type MEDICAID [place] ACCESS reporting period from 06/14/2022    Authorization Time Period [employer] auth 8/7-8/15 3 PT visits        (max combined 27 PT/OT/SLP per calendar year, auth is required)    Authorization - Visit Number 3    Authorization - Number of Visits 3    Progress Note Due on Visit 10    PT Start Time 1000    PT Stop Time 1030    PT Time Calculation (min) 30 min    Activity Tolerance Patient tolerated treatment well    Behavior During Therapy WFL for tasks assessed/performed               Past Medical History:  Diagnosis Date   Bipolar 1 disorder ([place])    Hypertension    TBI (traumatic brain injury) [facility])    Past Surgical History:  Procedure Laterality Date   APPENDECTOMY     ESOPHAGOGASTRODUODENOSCOPY N/A 12/20/2016   Procedure: ESOPHAGOGASTRODUODENOSCOPY (EGD);  Surgeon: Ronald Lobo, MD;  Location: [facility];  Service: Endoscopy;  Laterality: N/A;   HIP FRACTURE SURGERY     Patient Active Problem List   Diagnosis Date Noted   Bilateral shoulder pain 01/04/2017   Physical deconditioning    Symptomatic anemia    Tobacco abuse    Melena 12/19/2016   Acute blood loss anemia 12/19/2016   ETOH abuse 12/19/2016   UGIB (upper gastrointestinal bleed) 12/19/2016   Hypotension, unspecified 04/04/2013   Sepsis ([place]) 04/02/2013   Bipolar affective disorder ([place]) 04/02/2013   Polysubstance abuse ([place]) 04/02/2013   HTN (hypertension) 04/02/2013   Syncope 04/02/2013   Acute kidney injury ([place]) 04/02/2013    REFERRING DIAG: other  secondary osteoarthritis of right hip  THERAPY DIAG:  Pain in right hip  Stiffness of right hip, not elsewhere classified  Abnormal posture  Difficulty in walking, not elsewhere classified  Rationale for Evaluation and Treatment: Rehabilitation  PERTINENT HISTORY: Patient is a 61 y.o. male who presents to outpatient physical therapy with a referral for medical diagnosis other secondary osteoarthritis of right hip. This patient's chief complaints consist of bilateral hip stiffness and pain, stooped posture, difficulty with mobility S/P R THA posterior approach 05/28/2022 leading to the following functional deficits: difficulty with usual activities including household and community ambulation, transfers, and bed mobility, standing, putting R shoe on, stairs, squatting, preventing/treating genital rash his doctor told him was due to not being able to get up and walk around enough, mobility without falling, etc. Relevant past medical history and comorbidities include current smoker, smoker's cough, HTN, ETOH abuse, bipolar affective disorder, bipolar I disorder, polysubstance abuse, syncope, physical deconditioning, B shoulder pain (patient reports he needs both replaced), appendectomy, L hip pinning and chronic stiffness in B hips, prior R hip pinning, B hip dysfunciton since childhood, low back pain, TBI. Patient denies hx of cancer, stroke, seizures, heart problems, diabetes, unexplained weight loss, unexplained changes in bowel or bladder problems, unexplained stumbling or dropping things, osteoporosis, and spinal surgery  PRECAUTIONS: Fall  SUBJECTIVE:  Patient reports no pain upon arrival. He states he was able to cross R foot over left knee to get shoes on. He feels like he is making progress since starting PT. He states he is now able to step over the tub without touching with his foot. He states getting up and down and walking has gotten easier since starting PT but he still needs his walk.    PAIN:  Are you having pain? No  OBJECTIVE  SELF-REPORTED FUNCTION FOTO score: 38/100 (hip questionnaire)  PERIPHERAL JOINT MOTION (in degrees)   PASSIVE RANGE OF MOTION (PROM) *Indicates pain 06/14/22 06/25/22 Date  Joint/Motion R/L R/L R/L  Hip        Flexion  110/110 / /  Extension  -20/-35 -10/-25 /  Comments:  06/14/2022: L hip approx - 10 degrees IR in sitting, no ER limitation. R hip IR/ER in seated position limited to approx 20 degrees ER and 10 degrees IR. B knees and ankles appear grossly [facility] for basic mobility.    MUSCLE PERFORMANCE (MMT):  *Indicates pain 06/14/22 06/25/22 Date  Joint/Motion R/L R/L R/L  Hip        Flexion (L1, L2) 2/2 / /  Abduction 2/1 2/2 /  Knee        Extension (L3) 5/5 / /  Flexion (S2) 5/5 / /  Ankle/Foot        Dorsiflexion (L4) 5/5 / /  Great toe extension (L5) 4+/4+ / /  Eversion (S1) 5/5 / /  Plantarflexion (S1) 4/4 / /  Comments:  06/14/22 and 06/25/22: Unable to extend either hip to neutral for hip abduction testing.    FUNCTIONAL/BALANCE TESTS: 6 Min Walk Test: 290 feet with RW, 3 brief standing rests, complains of B UE fatigue from heavy use of RW and very stooped over posture.   Five Time Sit to Stand (5TSTS): 29 seconds from 18.5 inch plinth with B UE support on plinth and RW in front.   TODAY'S TREATMENT: Therapeutic exercise: to centralize symptoms and improve ROM, strength, muscular endurance, and activity tolerance required for successful completion of functional activities.  - testing to assess progress (see above).   Pt required multimodal cuing for proper technique and to facilitate improved neuromuscular control, strength, range of motion, and functional ability resulting in improved performance and form.    PATIENT EDUCATION:  Education details: Exercise form/purpose. POC>  Reviewed cancelation/no-show policy with patient and confirmed patient has correct phone number for clinic; patient verbalized understanding  (06/18/22). Person educated: Patient Education method: Explanation, demonstration, handout Education comprehension: verbalized understanding and needs further education   HOME EXERCISE PROGRAM: Access Code: [email or web address] URL: [email or web address] Date: 06/18/2022 Prepared by: Rosita Kea  Exercises - Sit to Stand with Counter Support  - 1 x daily - 2 sets - 10 reps - supine hip flexor stretch  - 1 x daily - 3 reps - 30-60 seconds hold - Supine Lower Trunk Rotation  - 1 x daily - 1 sets - 20 reps   ASSESSMENT:   CLINICAL IMPRESSION: Patient has attended 4 physical therapy sessions since starting current episode of care on 06/14/2022. He has attended and participated well and demonstrates significant improvements in 5 Time Sit To Stand Test as well as hip extension ROM. He also reports modest improvements in function including improved ability to cross R foot over left knee to don sock and ability to get into bath/shower. However, he continues to have significant impairments and functional limitations that severely limit his functional  mobility and quality of life. For example he has B hip contractures that prevents him from standing up straight enough to ambulate without an AD. Patient continues to present with significant pain, ROM, tissue integrity, posture, gait, balance, muscle performance (strength/power/endurance) and activity tolerance impairments that are limiting ability to complete his usual activities including household and community ambulation, transfers, and bed mobility, standing, putting R shoe on, stairs, squatting, preventing/treating genital rash his doctor told him was due to not being able to get up and walk around enough, mobility without falling, etc without difficulty. Patient would benefit from continued management of limiting condition by skilled physical therapist to address remaining impairments and functional limitations to work towards stated goals and return  to PLOF or maximal functional independence.   Patient is a 60 y.o. male referred to outpatient physical therapy with a medical diagnosis of other secondary osteoarthritis of right hip who presents with signs and symptoms consistent with right hip stiffness, weakness, and dysfunction S/P R THA posterior approach 05/28/2022. Patient's presentation is complicated by chronic B hip problems resulting in pinning of both hips since he was 61 years old that restricted B hip ROM and continues to restrict L hip ROM and cause pain. 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 His recent R THA improves his potential for normalized ROM, strength, and function of R hip but his function will likely continue to be limited by L hip which is still pinned with limited potential ROM available. Patient's rehab will also likely be affected by chronic back pain, B shoulder pain/dysfunction and his history of ETOH and multi-substance abuse, bipolar disorder, and dependence on others for transportation. Patient presents with significant pain, ROM, tissue integrity, posture, gait, balance, muscle performance (strength/power/endurance) and activity tolerance impairments that are limiting ability to complete his usual activities including household and community ambulation, transfers, and bed mobility, standing, putting R shoe on, stairs, squatting, preventing/treating genital rash his doctor told him was due to not being able to get up and walk around enough, mobility without falling, etc without difficulty. Patient will benefit from skilled physical therapy intervention to address current body structure impairments and activity limitations to improve function and work towards goals set in current POC in order to return to prior level of function or maximal functional improvement.    OBJECTIVE IMPAIRMENTS Abnormal gait, decreased activity tolerance, decreased balance, decreased  endurance, decreased knowledge of condition, decreased knowledge of use of DME, decreased mobility, difficulty walking, decreased ROM, decreased strength, hypomobility, increased edema, increased fascial restrictions, impaired perceived functional ability, impaired flexibility, impaired sensation, impaired UE functional use, improper body mechanics, postural dysfunction, obesity, and pain.    ACTIVITY LIMITATIONS carrying, lifting, bending, standing, squatting, stairs, transfers, bed mobility, bathing, toileting, dressing, hygiene/grooming, and locomotion level   PARTICIPATION LIMITATIONS: meal prep, cleaning, laundry, shopping, community activity, and   usual activities including household and community ambulation, transfers, and bed mobility, standing, putting R shoe on, stairs, squatting, preventing/treating genital rash his doctor told him was due to not being able to get up and walk around enough, mobility without falling, etc   PERSONAL FACTORS Fitness, Past/current experiences, Time since onset of injury/illness/exacerbation, Transportation, and 3+ comorbidities:   current smoker, smoker's cough, HTN, ETOH abuse, bipolar affective disorder, bipolar I disorder, polysubstance abuse, syncope, physical deconditioning, B shoulder pain (patient reports he needs both replaced), appendectomy, L hip pinning and chronic stiffness in B hips, prior R hip pinning, B   hip dysfunciton since childhood, low back pain, TBI are also affecting patient's functional outcome.    REHAB POTENTIAL: Fair see comorbid conditions   CLINICAL DECISION MAKING: Evolving/moderate complexity   EVALUATION COMPLEXITY: Moderate     GOALS: Goals reviewed with patient? No   SHORT TERM GOALS: Target date: 06/28/2022   Patient will be independent with initial home exercise program for self-management of symptoms. Baseline: Initial HEP to be provided at visit 2 as appropriate (06/14/22); initial HEP provided at visit 2 (06/18/2022);   Goal status: Met 06/25/2022     LONG TERM GOALS: Target date: 09/06/2022   Patient will be independent with a long-term home exercise program for self-management of symptoms.  Baseline: Initial HEP to be provided at visit 2 as appropriate (06/14/22); initial HEP provided at visit 2 (06/18/2022); participating well (06/25/2022);  Goal status: In-progress   2.  Patient will demonstrate improved FOTO to equal or greater than 52 by visit #13 to demonstrate improvement in overall condition and self-reported functional ability.  Baseline: 43 (06/14/22); 38 at visit #4 (06/25/2022);  Goal status: In-progress   3.  Patient will demonstrate R hip PROM extension to 20 degrees to improve his ability to stand and walk with upright posture.  Baseline:  lacking 20 degrees (06/14/22); Goal status: In-progress   4.  Patient will demonstrate L hip abduction strength 4+/5 to improve his ability to ambulate with a less restrictive assistive device, such as a SPC. Baseline: 2/5 (06/14/22); Goal status: In-progress   5.  Patient will ambulate 1000 feet with mod I using LRAD during 6 Minute Walk Test to demonstrate improved community and household mobility.  Baseline: to be tested visit 2 as appropriate (06/14/22); 305 feet with RW, no rests (06/18/2022); 290 feet with RW, 3 brief standing rests, complains of B UE fatigue from heavy use of RW and very stooped over posture (06/25/2022); Goal status: In-progress   6.  Patient will complete 5 Time Sit to Stand test in equal or less than 12 seconds from 18.5 inch plinth with no UE support to improve his fall risk and ability to transfer safely in his home.  Baseline: 54 seconds from 18.5 inch plinth with B UE support on plinth and RW in front. (06/14/2022); Goal status: in progress       PLAN: PT FREQUENCY: 1-2x/week   PT DURATION: 12 weeks   PLANNED INTERVENTIONS: Therapeutic exercises, Therapeutic activity, Neuromuscular re-education, Balance training, Gait  training, Patient/Family education, Stair training, DME instructions, Electrical stimulation, Spinal mobilization, Cryotherapy, Moist heat, scar mobilization, Manual therapy, and Re-evaluation   PLAN FOR NEXT SESSION: continue the following as insurance auth allows: update HEP as appropriate, interventions for improving hip ROM, LE and functional strength and balance exercises as appropriate.      Nancy Nordmann, PT, DPT 06/25/2022, 9:21 PM  [facility] [address] [place], [place] [address] P: [phone] I F: [phone]

## 2022-06-27 ENCOUNTER — Ambulatory Visit: Payer: Medicaid Other | Admitting: Physical Therapy

## 2022-06-27 ENCOUNTER — Encounter: Payer: Self-pay | Admitting: Physical Therapy

## 2022-06-27 DIAGNOSIS — M25551 Pain in right hip: Secondary | ICD-10-CM | POA: Diagnosis not present

## 2022-06-27 DIAGNOSIS — R293 Abnormal posture: Secondary | ICD-10-CM

## 2022-06-27 DIAGNOSIS — R262 Difficulty in walking, not elsewhere classified: Secondary | ICD-10-CM

## 2022-06-27 DIAGNOSIS — M25651 Stiffness of right hip, not elsewhere classified: Secondary | ICD-10-CM

## 2022-06-27 NOTE — Therapy (Signed)
OUTPATIENT PHYSICAL THERAPY TREATMENT NOTE   Patient Name: Johnny Banks MRN: [id number] DOB:May 07, 1961, 61 y.o., male Today's Date: 06/27/2022  PCP: Nolene Ebbs, MD REFERRING PROVIDER: Laure Kidney, PA-C  END OF SESSION:   PT End of Session - 06/27/22 0907     Visit Number 5    Number of Visits 15    Date for PT Re-Evaluation 09/06/22    Authorization Type MEDICAID [place] ACCESS reporting period from 06/14/2022    Authorization Time Period [employer] auth 8/7-8/15 3 PT visits        (max combined 27 PT/OT/SLP per calendar year, auth is required)    Authorization - Visit Number 4    Authorization - Number of Visits 3    Progress Note Due on Visit 10    PT Start Time 0902    PT Stop Time 0940    PT Time Calculation (min) 38 min    Activity Tolerance Patient tolerated treatment well    Behavior During Therapy WFL for tasks assessed/performed             Past Medical History:  Diagnosis Date   Bipolar 1 disorder ([place])    Hypertension    TBI (traumatic brain injury) [facility])    Past Surgical History:  Procedure Laterality Date   APPENDECTOMY     ESOPHAGOGASTRODUODENOSCOPY N/A 12/20/2016   Procedure: ESOPHAGOGASTRODUODENOSCOPY (EGD);  Surgeon: Ronald Lobo, MD;  Location: [facility];  Service: Endoscopy;  Laterality: N/A;   HIP FRACTURE SURGERY     Patient Active Problem List   Diagnosis Date Noted   Bilateral shoulder pain 01/04/2017   Physical deconditioning    Symptomatic anemia    Tobacco abuse    Melena 12/19/2016   Acute blood loss anemia 12/19/2016   ETOH abuse 12/19/2016   UGIB (upper gastrointestinal bleed) 12/19/2016   Hypotension, unspecified 04/04/2013   Sepsis ([place]) 04/02/2013   Bipolar affective disorder ([place]) 04/02/2013   Polysubstance abuse ([place]) 04/02/2013   HTN (hypertension) 04/02/2013   Syncope 04/02/2013   Acute kidney injury ([place]) 04/02/2013    REFERRING DIAG: other secondary osteoarthritis of right hip  THERAPY DIAG:  Pain in  right hip  Stiffness of right hip, not elsewhere classified  Abnormal posture  Difficulty in walking, not elsewhere classified  Rationale for Evaluation and Treatment: Rehabilitation  PERTINENT HISTORY: Patient is a 61 y.o. male who presents to outpatient physical therapy with a referral for medical diagnosis other secondary osteoarthritis of right hip. This patient's chief complaints consist of bilateral hip stiffness and pain, stooped posture, difficulty with mobility S/P R THA posterior approach 05/28/2022 leading to the following functional deficits: difficulty with usual activities including household and community ambulation, transfers, and bed mobility, standing, putting R shoe on, stairs, squatting, preventing/treating genital rash his doctor told him was due to not being able to get up and walk around enough, mobility without falling, etc. Relevant past medical history and comorbidities include current smoker, smoker's cough, HTN, ETOH abuse, bipolar affective disorder, bipolar I disorder, polysubstance abuse, syncope, physical deconditioning, B shoulder pain (patient reports he needs both replaced), appendectomy, L hip pinning and chronic stiffness in B hips, prior R hip pinning, B hip dysfunciton since childhood, low back pain, TBI. Patient denies hx of cancer, stroke, seizures, heart problems, diabetes, unexplained weight loss, unexplained changes in bowel or bladder problems, unexplained stumbling or dropping things, osteoporosis, and spinal surgery  PRECAUTIONS: Fall  SUBJECTIVE: Patient reports he fell on his rear when he got out of  bed this morning. He had fallen asleep in his socks and they slipped on the hardwood floor while he missed the hand support he reached for. He states it was much easier than usual to get up from the floor and he was able to do it on his own. He states his general pain today is 5/10 and does not report a specific region. He states he felt good after last PT  session. He states he is doing his HEP.   PAIN:  Are you having pain? 5/10 general pain, no specific body part.   OBJECTIVE   TODAY'S TREATMENT: Therapeutic exercise: to centralize symptoms and improve ROM, strength, muscular endurance, and activity tolerance required for successful completion of functional activities.  - NuStep level 4 using bilateral lower extremities. Seat setting  12. For improved extremity mobility, muscular endurance, and activity tolerance; and to induce the analgesic effect of aerobic exercise, stimulate improved joint nutrition, and prepare body structures and systems for following interventions. x 6  minutes. Average SPM = 48 (cued to try to get above 50 spm).  Circuit: - step up to 6 inch step at bottom of stair, leading up with right and down with left, 2x10. Has difficulty flexing R hip, heavy B UE use on handrails.  - standing B hip and back extension stretch holding bottom of stair rails and pushing both hips forwards. 3x10.  - attempted hip abduction at hip machine, but unable even with lightest weight.  - standing AROM hip abduction with B UE support on TM bar, 1x10 each side. (Heavy effort).  - hooklying lower trunk rotation, 3 min at self selected pace.   Pt required multimodal cuing for proper technique and to facilitate improved neuromuscular control, strength, range of motion, and functional ability resulting in improved performance and form.    PATIENT EDUCATION:  Education details: Exercise form/purpose.   Reviewed cancelation/no-show policy with patient and confirmed patient has correct phone number for clinic; patient verbalized understanding (06/18/22). Person educated: Patient Education method: Explanation, demonstration, handout Education comprehension: verbalized understanding and needs further education   HOME EXERCISE PROGRAM: Access Code: [email or web address] URL: [email or web address] Date: 06/18/2022 Prepared by: Rosita Kea  Exercises - Sit to Stand with Counter Support  - 1 x daily - 2 sets - 10 reps - supine hip flexor stretch  - 1 x daily - 3 reps - 30-60 seconds hold - Supine Lower Trunk Rotation  - 1 x daily - 1 sets - 20 reps   ASSESSMENT:   CLINICAL IMPRESSION: Patient tolerated treatment with some difficulty due to pain in his back and B UE and quick fatigue. He struggled with strength in B LE and required heavy use of B UE. He continues to lack the ability to maintain straight standing posture due to B hip contractures and low back stiffness. Patient would benefit from continued management of limiting condition by skilled physical therapist to address remaining impairments and functional limitations to work towards stated goals and return to PLOF or maximal functional independence.   Patient is a 61 y.o. male referred to outpatient physical therapy with a medical diagnosis of other secondary osteoarthritis of right hip who presents with signs and symptoms consistent with right hip stiffness, weakness, and dysfunction S/P R THA posterior approach 05/28/2022. Patient's presentation is complicated by chronic B hip problems resulting in pinning of both hips since he was 61 years old that restricted B hip ROM and continues to restrict L hip ROM and cause pain. Patient also  appears to have flexion contractures at B hips related to years of limited upright mobility. His recent R THA improves his potential for normalized ROM, strength, and function of R hip but his function will likely continue to be limited by L hip which is still pinned with limited potential ROM available. Patient's rehab will also likely be affected by chronic back pain, B shoulder pain/dysfunction and his history of ETOH and multi-substance abuse, bipolar disorder, and dependence on others for transportation. Patient presents with significant pain, ROM, tissue integrity, posture, gait, balance, muscle performance (strength/power/endurance) and  activity tolerance impairments that are limiting ability to complete his usual activities including household and community ambulation, transfers, and bed mobility, standing, putting R shoe on, stairs, squatting, preventing/treating genital rash his doctor told him was due to not being able to get up and walk around enough, mobility without falling, etc without difficulty. Patient will benefit from skilled physical therapy intervention to address current body structure impairments and activity limitations to improve function and work towards goals set in current POC in order to return to prior level of function or maximal functional improvement.    OBJECTIVE IMPAIRMENTS Abnormal gait, decreased activity tolerance, decreased balance, decreased endurance, decreased knowledge of condition, decreased knowledge of use of DME, decreased mobility, difficulty walking, decreased ROM, decreased strength, hypomobility, increased edema, increased fascial restrictions, impaired perceived functional ability, impaired flexibility, impaired sensation, impaired UE functional use, improper body mechanics, postural dysfunction, obesity, and pain.    ACTIVITY LIMITATIONS carrying, lifting, bending, standing, squatting, stairs, transfers, bed mobility, bathing, toileting, dressing, hygiene/grooming, and locomotion level   PARTICIPATION LIMITATIONS: meal prep, cleaning, laundry, shopping, community activity, and   usual activities including household and community ambulation, transfers, and bed mobility, standing, putting R shoe on, stairs, squatting, preventing/treating genital rash his doctor told him was due to not being able to get up and walk around enough, mobility without falling, etc   PERSONAL FACTORS Fitness, Past/current experiences, Time since onset of injury/illness/exacerbation, Transportation, and 3+ comorbidities:   current smoker, smoker's cough, HTN, ETOH abuse, bipolar affective disorder, bipolar I disorder,  polysubstance abuse, syncope, physical deconditioning, B shoulder pain (patient reports he needs both replaced), appendectomy, L hip pinning and chronic stiffness in B hips, prior R hip pinning, B hip dysfunciton since childhood, low back pain, TBI are also affecting patient's functional outcome.    REHAB POTENTIAL: Fair see comorbid conditions   CLINICAL DECISION MAKING: Evolving/moderate complexity   EVALUATION COMPLEXITY: Moderate     GOALS: Goals reviewed with patient? No   SHORT TERM GOALS: Target date: 06/28/2022   Patient will be independent with initial home exercise program for self-management of symptoms. Baseline: Initial HEP to be provided at visit 2 as appropriate (06/14/22); initial HEP provided at visit 2 (06/18/2022);  Goal status: Met 06/25/2022     LONG TERM GOALS: Target date: 09/06/2022   Patient will be independent with a long-term home exercise program for self-management of symptoms.  Baseline: Initial HEP to be provided at visit 2 as appropriate (06/14/22); initial HEP provided at visit 2 (06/18/2022); participating well (06/25/2022);  Goal status: In-progress   2.  Patient will demonstrate improved FOTO to equal or greater than 52 by visit #13 to demonstrate improvement in overall condition and self-reported functional ability.  Baseline: 43 (06/14/22); 38 at visit #4 (06/25/2022);  Goal status: In-progress   3.  Patient will demonstrate R hip PROM extension to 20 degrees to improve his ability to stand and walk with upright posture.  Baseline:  lacking 20 degrees (06/14/22); Goal status: In-progress   4.  Patient will demonstrate L hip abduction strength 4+/5 to improve his ability to ambulate with a less restrictive assistive device, such as a SPC. Baseline: 2/5 (06/14/22); Goal status: In-progress   5.  Patient will ambulate 1000 feet with mod I using LRAD during 6 Minute Walk Test to demonstrate improved community and household mobility.  Baseline: to be  tested visit 2 as appropriate (06/14/22); 305 feet with RW, no rests (06/18/2022); 290 feet with RW, 3 brief standing rests, complains of B UE fatigue from heavy use of RW and very stooped over posture (06/25/2022); Goal status: In-progress   6.  Patient will complete 5 Time Sit to Stand test in equal or less than 12 seconds from 18.5 inch plinth with no UE support to improve his fall risk and ability to transfer safely in his home.  Baseline: 54 seconds from 18.5 inch plinth with B UE support on plinth and RW in front. (06/14/2022); Goal status: in progress       PLAN: PT FREQUENCY: 1-2x/week   PT DURATION: 12 weeks   PLANNED INTERVENTIONS: Therapeutic exercises, Therapeutic activity, Neuromuscular re-education, Balance training, Gait training, Patient/Family education, Stair training, DME instructions, Electrical stimulation, Spinal mobilization, Cryotherapy, Moist heat, scar mobilization, Manual therapy, and Re-evaluation   PLAN FOR NEXT SESSION: continue the following as insurance auth allows: update HEP as appropriate, interventions for improving hip ROM, LE and functional strength and balance exercises as appropriate.      Nancy Nordmann, PT, DPT 06/27/2022, 9:40 AM  [facility] [address] [place], [place] [address] P: [phone] I F: [phone]

## 2022-07-03 ENCOUNTER — Ambulatory Visit: Payer: Medicaid Other | Admitting: Physical Therapy

## 2022-07-05 ENCOUNTER — Ambulatory Visit: Payer: Medicaid Other | Admitting: Physical Therapy

## 2022-07-05 ENCOUNTER — Encounter: Payer: Self-pay | Admitting: Physical Therapy

## 2022-07-05 DIAGNOSIS — M25551 Pain in right hip: Secondary | ICD-10-CM | POA: Diagnosis not present

## 2022-07-05 DIAGNOSIS — M25651 Stiffness of right hip, not elsewhere classified: Secondary | ICD-10-CM

## 2022-07-05 DIAGNOSIS — R262 Difficulty in walking, not elsewhere classified: Secondary | ICD-10-CM

## 2022-07-05 DIAGNOSIS — R293 Abnormal posture: Secondary | ICD-10-CM

## 2022-07-05 NOTE — Therapy (Addendum)
OUTPATIENT PHYSICAL THERAPY TREATMENT NOTE   Patient Name: Johnny Banks MRN: [id number] DOB:Aug 20, 1961, 61 y.o., male Today's Date: 07/05/2022  PCP: Nolene Ebbs, MD REFERRING PROVIDER: Laure Kidney, PA-C  END OF SESSION:   PT End of Session - 07/05/22 1915     Visit Number 6    Number of Visits 15    Date for PT Re-Evaluation 09/06/22    Authorization Type MEDICAID [place] ACCESS reporting period from 06/14/2022    Authorization Time Period [employer] auth 8/16-9/26 for 12 PT visits       (max combined 27 PT/OT/SLP per calendar year, auth is required)    Authorization - Visit Number 2    Authorization - Number of Visits 12    Progress Note Due on Visit 10    PT Start Time 1120    PT Stop Time 1200    PT Time Calculation (min) 40 min    Activity Tolerance Patient tolerated treatment well    Behavior During Therapy WFL for tasks assessed/performed              Past Medical History:  Diagnosis Date   Bipolar 1 disorder ([place])    Hypertension    TBI (traumatic brain injury) [facility])    Past Surgical History:  Procedure Laterality Date   APPENDECTOMY     ESOPHAGOGASTRODUODENOSCOPY N/A 12/20/2016   Procedure: ESOPHAGOGASTRODUODENOSCOPY (EGD);  Surgeon: Ronald Lobo, MD;  Location: [facility];  Service: Endoscopy;  Laterality: N/A;   HIP FRACTURE SURGERY     Patient Active Problem List   Diagnosis Date Noted   Bilateral shoulder pain 01/04/2017   Physical deconditioning    Symptomatic anemia    Tobacco abuse    Melena 12/19/2016   Acute blood loss anemia 12/19/2016   ETOH abuse 12/19/2016   UGIB (upper gastrointestinal bleed) 12/19/2016   Hypotension, unspecified 04/04/2013   Sepsis ([place]) 04/02/2013   Bipolar affective disorder ([place]) 04/02/2013   Polysubstance abuse ([place]) 04/02/2013   HTN (hypertension) 04/02/2013   Syncope 04/02/2013   Acute kidney injury ([place]) 04/02/2013    REFERRING DIAG: other secondary osteoarthritis of right hip  THERAPY DIAG:  Pain  in right hip  Stiffness of right hip, not elsewhere classified  Abnormal posture  Difficulty in walking, not elsewhere classified  Rationale for Evaluation and Treatment: Rehabilitation  PERTINENT HISTORY: Patient is a 61 y.o. male who presents to outpatient physical therapy with a referral for medical diagnosis other secondary osteoarthritis of right hip. This patient's chief complaints consist of bilateral hip stiffness and pain, stooped posture, difficulty with mobility S/P R THA posterior approach 05/28/2022 leading to the following functional deficits: difficulty with usual activities including household and community ambulation, transfers, and bed mobility, standing, putting R shoe on, stairs, squatting, preventing/treating genital rash his doctor told him was due to not being able to get up and walk around enough, mobility without falling, etc. Relevant past medical history and comorbidities include current smoker, smoker's cough, HTN, ETOH abuse, bipolar affective disorder, bipolar I disorder, polysubstance abuse, syncope, physical deconditioning, B shoulder pain (patient reports he needs both replaced), appendectomy, L hip pinning and chronic stiffness in B hips, prior R hip pinning, B hip dysfunciton since childhood, low back pain, TBI. Patient denies hx of cancer, stroke, seizures, heart problems, diabetes, unexplained weight loss, unexplained changes in bowel or bladder problems, unexplained stumbling or dropping things, osteoporosis, and spinal surgery  PRECAUTIONS: Fall  SUBJECTIVE: Patient reports he is feeling well today. He arrives with RW.  PAIN:  Are you having pain? denies  OBJECTIVE   6MWT: 254 feet in 6 min with RW adjusted 2 notches higher than before. SBA. Strong UE fatigue.   TODAY'S TREATMENT: Therapeutic exercise: to centralize symptoms and improve ROM, strength, muscular endurance, and activity tolerance required for successful completion of functional activities.   -Ambulation 254 feet in 6 min with RW adjusted 2 notches higher than before. SBA. Strong UE fatigue.  - standing 15lb ball slam and pick up with CGA and chair behind for safety. 1x10.   Circuit: - step up to 6 inch step at bottom of stair, leading up with right and down with left, 2x10. Has difficulty flexing R hip, heavy B UE use on handrails.  - standing B hip and back extension stretch holding bottom of stair rails and pushing both hips forwards. 2x10. (Pain in small of his back).   - standing 15# slam ball forwards slams and pick up, 1x10, CGA in front of chair.  - leaning/lying on elevated plinth with hip crease on edge of end of plinth and feet on floor: hip extension, 1x10 each side AROM, 2x10 hip extension with 3#AW on each side.  - half prone R hip flexor/low back stretch with right side of body on plinth and left LE off plinth with L foot planted, with prone press up with arms. PT assists with keeping R LE from sliding off edge of plinth, 3x60 seconds.   Pt required multimodal cuing for proper technique and to facilitate improved neuromuscular control, strength, range of motion, and functional ability resulting in improved performance and form.    PATIENT EDUCATION:  Education details: Exercise form/purpose.   Reviewed cancelation/no-show policy with patient and confirmed patient has correct phone number for clinic; patient verbalized understanding (06/18/22). Person educated: Patient Education method: Explanation, demonstration, handout Education comprehension: verbalized understanding and needs further education   HOME EXERCISE PROGRAM: Access Code: [email or web address] URL: [email or web address] Date: 06/18/2022 Prepared by: Rosita Kea  Exercises - Sit to Stand with Counter Support  - 1 x daily - 2 sets - 10 reps - supine hip flexor stretch  - 1 x daily - 3 reps - 30-60 seconds hold - Supine Lower Trunk Rotation  - 1 x daily - 1 sets - 20 reps   ASSESSMENT:    CLINICAL IMPRESSION: Patient tolerated treatment well overall but with continued difficulty due to quick fatigue, B hip flexor contractures, low back stiffness, LE weakness, and imbalance. Patient continues to be unable to stand up straight resulting in increased load on B UE through RW. Increasing height of RW improved posture slightly. Plan to continue working on improving hip and low back ROM as well as LE and functional strength and balance as tolerated. Patient would benefit from continued management of limiting condition by skilled physical therapist to address remaining impairments and functional limitations to work towards stated goals and return to PLOF or maximal functional independence.   Patient is a 61 y.o. male referred to outpatient physical therapy with a medical diagnosis of other secondary osteoarthritis of right hip who presents with signs and symptoms consistent with right hip stiffness, weakness, and dysfunction S/P R THA posterior approach 05/28/2022. Patient's presentation is complicated by chronic B hip problems resulting in pinning of both hips since he was 61 years old that restricted B hip ROM and continues to restrict L hip ROM and cause pain. 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 His  recent R THA improves his potential for normalized ROM, strength, and function of R hip but his function will likely continue to be limited by L hip which is still pinned with limited potential ROM available. Patient's rehab will also likely be affected by chronic back pain, B shoulder pain/dysfunction and his history of ETOH and multi-substance abuse, bipolar disorder, and dependence on others for transportation. Patient presents with significant pain, ROM, tissue integrity, posture, gait, balance, muscle performance (strength/power/endurance) and activity tolerance impairments that are limiting ability to complete his usual activities  including household and community ambulation, transfers, and bed mobility, standing, putting R shoe on, stairs, squatting, preventing/treating genital rash his doctor told him was due to not being able to get up and walk around enough, mobility without falling, etc without difficulty. Patient will benefit from skilled physical therapy intervention to address current body structure impairments and activity limitations to improve function and work towards goals set in current POC in order to return to prior level of function or maximal functional improvement.    OBJECTIVE IMPAIRMENTS Abnormal gait, decreased activity tolerance, decreased balance, decreased endurance, decreased knowledge of condition, decreased knowledge of use of DME, decreased mobility, difficulty walking, decreased ROM, decreased strength, hypomobility, increased edema, increased fascial restrictions, impaired perceived functional ability, impaired flexibility, impaired sensation, impaired UE functional use, improper body mechanics, postural dysfunction, obesity, and pain.    ACTIVITY LIMITATIONS carrying, lifting, bending, standing, squatting, stairs, transfers, bed mobility, bathing, toileting, dressing, hygiene/grooming, and locomotion level   PARTICIPATION LIMITATIONS: meal prep, cleaning, laundry, shopping, community activity, and   usual activities including household and community ambulation, transfers, and bed mobility, standing, putting R shoe on, stairs, squatting, preventing/treating genital rash his doctor told him was due to not being able to get up and walk around enough, mobility without falling, etc   PERSONAL FACTORS Fitness, Past/current experiences, Time since onset of injury/illness/exacerbation, Transportation, and 3+ comorbidities:   current smoker, smoker's cough, HTN, ETOH abuse, bipolar affective disorder, bipolar I disorder, polysubstance abuse, syncope, physical deconditioning, B shoulder pain (patient reports he  needs both replaced), appendectomy, L hip pinning and chronic stiffness in B hips, prior R hip pinning, B hip dysfunciton since childhood, low back pain, TBI are also affecting patient's functional outcome.    REHAB POTENTIAL: Fair see comorbid conditions   CLINICAL DECISION MAKING: Evolving/moderate complexity   EVALUATION COMPLEXITY: Moderate     GOALS: Goals reviewed with patient? No   SHORT TERM GOALS: Target date: 06/28/2022   Patient will be independent with initial home exercise program for self-management of symptoms. Baseline: Initial HEP to be provided at visit 2 as appropriate (06/14/22); initial HEP provided at visit 2 (06/18/2022);  Goal status: Met 06/25/2022     LONG TERM GOALS: Target date: 09/06/2022   Patient will be independent with a long-term home exercise program for self-management of symptoms.  Baseline: Initial HEP to be provided at visit 2 as appropriate (06/14/22); initial HEP provided at visit 2 (06/18/2022); participating well (06/25/2022);  Goal status: In-progress   2.  Patient will demonstrate improved FOTO to equal or greater than 52 by visit #13 to demonstrate improvement in overall condition and self-reported functional ability.  Baseline: 43 (06/14/22); 38 at visit #4 (06/25/2022);  Goal status: In-progress   3.  Patient will demonstrate R hip PROM extension to 20 degrees to improve his ability to stand and walk with upright posture.  Baseline:  lacking 20 degrees (06/14/22); Goal status: In-progress   4.  Patient will  demonstrate L hip abduction strength 4+/5 to improve his ability to ambulate with a less restrictive assistive device, such as a SPC. Baseline: 2/5 (06/14/22); Goal status: In-progress   5.  Patient will ambulate 1000 feet with mod I using LRAD during 6 Minute Walk Test to demonstrate improved community and household mobility.  Baseline: to be tested visit 2 as appropriate (06/14/22); 305 feet with RW, no rests (06/18/2022); 290 feet with  RW, 3 brief standing rests, complains of B UE fatigue from heavy use of RW and very stooped over posture (06/25/2022); Goal status: In-progress   6.  Patient will complete 5 Time Sit to Stand test in equal or less than 12 seconds from 18.5 inch plinth with no UE support to improve his fall risk and ability to transfer safely in his home.  Baseline: 54 seconds from 18.5 inch plinth with B UE support on plinth and RW in front. (06/14/2022); Goal status: in progress       PLAN: PT FREQUENCY: 1-2x/week   PT DURATION: 12 weeks   PLANNED INTERVENTIONS: Therapeutic exercises, Therapeutic activity, Neuromuscular re-education, Balance training, Gait training, Patient/Family education, Stair training, DME instructions, Electrical stimulation, Spinal mobilization, Cryotherapy, Moist heat, scar mobilization, Manual therapy, and Re-evaluation   PLAN FOR NEXT SESSION: continue the following as insurance auth allows: update HEP as appropriate, interventions for improving hip ROM, LE and functional strength and balance exercises as appropriate.      Nancy Nordmann, PT, DPT 07/05/2022, 7:21 PM  [facility] [address] [place], [place] [address] P: [phone] I F: [phone]

## 2022-07-10 ENCOUNTER — Ambulatory Visit: Payer: Medicaid Other | Admitting: Physical Therapy

## 2022-07-10 ENCOUNTER — Encounter: Payer: Self-pay | Admitting: Physical Therapy

## 2022-07-10 DIAGNOSIS — R262 Difficulty in walking, not elsewhere classified: Secondary | ICD-10-CM

## 2022-07-10 DIAGNOSIS — M25651 Stiffness of right hip, not elsewhere classified: Secondary | ICD-10-CM

## 2022-07-10 DIAGNOSIS — M25551 Pain in right hip: Secondary | ICD-10-CM | POA: Diagnosis not present

## 2022-07-10 DIAGNOSIS — R293 Abnormal posture: Secondary | ICD-10-CM

## 2022-07-10 NOTE — Therapy (Signed)
OUTPATIENT PHYSICAL THERAPY TREATMENT NOTE   Patient Name: Johnny Banks MRN: [id number] DOB:[birth year], 61 y.o., male Today's Date: 07/10/2022  PCP: Nolene Ebbs, MD REFERRING PROVIDER: Laure Kidney, PA-C  END OF SESSION:   PT End of Session - 07/10/22 1010     Visit Number 7    Number of Visits 15    Date for PT Re-Evaluation 09/06/22    Authorization Type MEDICAID [place] ACCESS reporting period from 06/14/2022    Authorization Time Period [employer] auth 8/16-9/26 for 12 PT visits       (max combined 27 PT/OT/SLP per calendar year, auth is required)    Authorization - Visit Number 3    Authorization - Number of Visits 12    Progress Note Due on Visit 10    PT Start Time 0955    PT Stop Time 1033    PT Time Calculation (min) 38 min    Equipment Utilized During Treatment Gait belt    Activity Tolerance Patient tolerated treatment well;Patient limited by fatigue;Patient limited by pain    Behavior During Therapy WFL for tasks assessed/performed               Past Medical History:  Diagnosis Date   Bipolar 1 disorder ([place])    Hypertension    TBI (traumatic brain injury) [facility])    Past Surgical History:  Procedure Laterality Date   APPENDECTOMY     ESOPHAGOGASTRODUODENOSCOPY N/A 12/20/2016   Procedure: ESOPHAGOGASTRODUODENOSCOPY (EGD);  Surgeon: Ronald Lobo, MD;  Location: [facility];  Service: Endoscopy;  Laterality: N/A;   HIP FRACTURE SURGERY     Patient Active Problem List   Diagnosis Date Noted   Bilateral shoulder pain 01/04/2017   Physical deconditioning    Symptomatic anemia    Tobacco abuse    Melena 12/19/2016   Acute blood loss anemia 12/19/2016   ETOH abuse 12/19/2016   UGIB (upper gastrointestinal bleed) 12/19/2016   Hypotension, unspecified 04/04/2013   Sepsis ([place]) 04/02/2013   Bipolar affective disorder ([place]) 04/02/2013   Polysubstance abuse ([place]) 04/02/2013   HTN (hypertension) 04/02/2013   Syncope 04/02/2013   Acute kidney injury  ([place]) 04/02/2013    REFERRING DIAG: other secondary osteoarthritis of right hip  THERAPY DIAG:  Pain in right hip  Stiffness of right hip, not elsewhere classified  Abnormal posture  Difficulty in walking, not elsewhere classified  Rationale for Evaluation and Treatment: Rehabilitation  PERTINENT HISTORY: Patient is a 61 y.o. male who presents to outpatient physical therapy with a referral for medical diagnosis other secondary osteoarthritis of right hip. This patient's chief complaints consist of bilateral hip stiffness and pain, stooped posture, difficulty with mobility S/P R THA posterior approach 05/28/2022 leading to the following functional deficits: difficulty with usual activities including household and community ambulation, transfers, and bed mobility, standing, putting R shoe on, stairs, squatting, preventing/treating genital rash his doctor told him was due to not being able to get up and walk around enough, mobility without falling, etc. Relevant past medical history and comorbidities include current smoker, smoker's cough, HTN, ETOH abuse, bipolar affective disorder, bipolar I disorder, polysubstance abuse, syncope, physical deconditioning, B shoulder pain (patient reports he needs both replaced), appendectomy, L hip pinning and chronic stiffness in B hips, prior R hip pinning, B hip dysfunciton since childhood, low back pain, TBI. Patient denies hx of cancer, stroke, seizures, heart problems, diabetes, unexplained weight loss, unexplained changes in bowel or bladder problems, unexplained stumbling or dropping things, osteoporosis, and spinal surgery  PRECAUTIONS: Fall  SUBJECTIVE: Patient reports he is feeling well today. He arrives with RW.   PAIN:  Are you having pain? denies  OBJECTIVE   6MWT: 254 feet in 6 min with RW adjusted 2 notches higher than before. SBA. Strong UE fatigue.   TODAY'S TREATMENT: Therapeutic exercise: to centralize symptoms and improve ROM,  strength, muscular endurance, and activity tolerance required for successful completion of functional activities.  -Ambulation on TM focusing on decreasing UE support, 3x1-2 min at 0.1-0.2 mph. CGA and hand held support at left UE. Chair provided for seated breaks on TM. Bilateral trendelenburg.  - single seated leg press at [place], 2x13 each side at 20# starting with partial ROM on L LE due to ER positioning. Assistance at [place] needed to get on/off plate.  - seated single leg knee extension on OMEGA machine:  R LE: 1x12 10# (RPE 4-6), 1x5 at 15# + 1x5 at 10# (RPE 10 after education).  L LE: 1x12 10# (RPE 4-6), 1x12 at 15# + 1x5 at 10# (RPE 2-3 after education).   Pt required multimodal cuing for proper technique and to facilitate improved neuromuscular control, strength, range of motion, and functional ability resulting in improved performance and form.    PATIENT EDUCATION:  Education details: Exercise form/purpose.   Reviewed cancelation/no-show policy with patient and confirmed patient has correct phone number for clinic; patient verbalized understanding (06/18/22). Person educated: Patient Education method: Explanation, demonstration, handout Education comprehension: verbalized understanding and needs further education   HOME EXERCISE PROGRAM: Access Code: [email or web address] URL: [email or web address] Date: 06/18/2022 Prepared by: Rosita Kea  Exercises - Sit to Stand with Counter Support  - 1 x daily - 2 sets - 10 reps - supine hip flexor stretch  - 1 x daily - 3 reps - 30-60 seconds hold - Supine Lower Trunk Rotation  - 1 x daily - 1 sets - 20 reps   ASSESSMENT:   CLINICAL IMPRESSION: Patient continues to have heavy reliance on B UE on RW with weight bearing activities and could only tolerate up to 2 min on TM at 0.1-0.2 mph. He demonstrated strong bilateral trendelenburg gait that reveals very weak lateral hip musculature. Combined with his hip flexion contractures  this makes it impossible for him to ambulate currently without significant UE support. Remainder of session focused on hip and knee strengthen which demonstrated significantly better quad strength at knee extension machine on the left compared to the right. Plan to continue with interventions for improving strength, ROM, and gait as tolerated. Patient would benefit from continued management of limiting condition by skilled physical therapist to address remaining impairments and functional limitations to work towards stated goals and return to PLOF or maximal functional independence.   Patient is a 61 y.o. male referred to outpatient physical therapy with a medical diagnosis of other secondary osteoarthritis of right hip who presents with signs and symptoms consistent with right hip stiffness, weakness, and dysfunction S/P R THA posterior approach 05/28/2022. Patient's presentation is complicated by chronic B hip problems resulting in pinning of both hips since he was 61 years old that restricted B hip ROM and continues to restrict L hip ROM and cause pain. 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 His recent R THA improves his potential for normalized ROM, strength, and function of R hip but his function will likely continue to be limited by L hip which is still pinned with limited potential ROM available.  Patient's rehab will also likely be affected by chronic back pain, B shoulder pain/dysfunction and his history of ETOH and multi-substance abuse, bipolar disorder, and dependence on others for transportation. Patient presents with significant pain, ROM, tissue integrity, posture, gait, balance, muscle performance (strength/power/endurance) and activity tolerance impairments that are limiting ability to complete his usual activities including household and community ambulation, transfers, and bed mobility, standing, putting R shoe on, stairs,  squatting, preventing/treating genital rash his doctor told him was due to not being able to get up and walk around enough, mobility without falling, etc without difficulty. Patient will benefit from skilled physical therapy intervention to address current body structure impairments and activity limitations to improve function and work towards goals set in current POC in order to return to prior level of function or maximal functional improvement.    OBJECTIVE IMPAIRMENTS Abnormal gait, decreased activity tolerance, decreased balance, decreased endurance, decreased knowledge of condition, decreased knowledge of use of DME, decreased mobility, difficulty walking, decreased ROM, decreased strength, hypomobility, increased edema, increased fascial restrictions, impaired perceived functional ability, impaired flexibility, impaired sensation, impaired UE functional use, improper body mechanics, postural dysfunction, obesity, and pain.    ACTIVITY LIMITATIONS carrying, lifting, bending, standing, squatting, stairs, transfers, bed mobility, bathing, toileting, dressing, hygiene/grooming, and locomotion level   PARTICIPATION LIMITATIONS: meal prep, cleaning, laundry, shopping, community activity, and   usual activities including household and community ambulation, transfers, and bed mobility, standing, putting R shoe on, stairs, squatting, preventing/treating genital rash his doctor told him was due to not being able to get up and walk around enough, mobility without falling, etc   PERSONAL FACTORS Fitness, Past/current experiences, Time since onset of injury/illness/exacerbation, Transportation, and 3+ comorbidities:   current smoker, smoker's cough, HTN, ETOH abuse, bipolar affective disorder, bipolar I disorder, polysubstance abuse, syncope, physical deconditioning, B shoulder pain (patient reports he needs both replaced), appendectomy, L hip pinning and chronic stiffness in B hips, prior R hip pinning, B hip  dysfunciton since childhood, low back pain, TBI are also affecting patient's functional outcome.    REHAB POTENTIAL: Fair see comorbid conditions   CLINICAL DECISION MAKING: Evolving/moderate complexity   EVALUATION COMPLEXITY: Moderate     GOALS: Goals reviewed with patient? No   SHORT TERM GOALS: Target date: 06/28/2022   Patient will be independent with initial home exercise program for self-management of symptoms. Baseline: Initial HEP to be provided at visit 2 as appropriate (06/14/22); initial HEP provided at visit 2 (06/18/2022);  Goal status: Met 06/25/2022     LONG TERM GOALS: Target date: 09/06/2022   Patient will be independent with a long-term home exercise program for self-management of symptoms.  Baseline: Initial HEP to be provided at visit 2 as appropriate (06/14/22); initial HEP provided at visit 2 (06/18/2022); participating well (06/25/2022);  Goal status: In-progress   2.  Patient will demonstrate improved FOTO to equal or greater than 52 by visit #13 to demonstrate improvement in overall condition and self-reported functional ability.  Baseline: 43 (06/14/22); 38 at visit #4 (06/25/2022);  Goal status: In-progress   3.  Patient will demonstrate R hip PROM extension to 20 degrees to improve his ability to stand and walk with upright posture.  Baseline:  lacking 20 degrees (06/14/22); Goal status: In-progress   4.  Patient will demonstrate L hip abduction strength 4+/5 to improve his ability to ambulate with a less restrictive assistive device, such as a SPC. Baseline: 2/5 (06/14/22); Goal status: In-progress   5.  Patient will ambulate 1000  feet with mod I using LRAD during 6 Minute Walk Test to demonstrate improved community and household mobility.  Baseline: to be tested visit 2 as appropriate (06/14/22); 305 feet with RW, no rests (06/18/2022); 290 feet with RW, 3 brief standing rests, complains of B UE fatigue from heavy use of RW and very stooped over posture  (06/25/2022); Goal status: In-progress   6.  Patient will complete 5 Time Sit to Stand test in equal or less than 12 seconds from 18.5 inch plinth with no UE support to improve his fall risk and ability to transfer safely in his home.  Baseline: 54 seconds from 18.5 inch plinth with B UE support on plinth and RW in front. (06/14/2022); Goal status: in progress       PLAN: PT FREQUENCY: 1-2x/week   PT DURATION: 12 weeks   PLANNED INTERVENTIONS: Therapeutic exercises, Therapeutic activity, Neuromuscular re-education, Balance training, Gait training, Patient/Family education, Stair training, DME instructions, Electrical stimulation, Spinal mobilization, Cryotherapy, Moist heat, scar mobilization, Manual therapy, and Re-evaluation   PLAN FOR NEXT SESSION: continue the following as insurance auth allows: update HEP as appropriate, interventions for improving hip ROM, LE and functional strength and balance exercises as appropriate.      Nancy Nordmann, PT, DPT 07/10/2022, 10:52 AM  [facility] [address] [place], [place] [address] P: [phone] I F: [phone]

## 2022-07-12 ENCOUNTER — Ambulatory Visit: Payer: Medicaid Other | Admitting: Physical Therapy

## 2022-07-12 ENCOUNTER — Encounter: Payer: Self-pay | Admitting: Physical Therapy

## 2022-07-12 DIAGNOSIS — R293 Abnormal posture: Secondary | ICD-10-CM

## 2022-07-12 DIAGNOSIS — M25551 Pain in right hip: Secondary | ICD-10-CM | POA: Diagnosis not present

## 2022-07-12 DIAGNOSIS — M25651 Stiffness of right hip, not elsewhere classified: Secondary | ICD-10-CM

## 2022-07-12 DIAGNOSIS — R262 Difficulty in walking, not elsewhere classified: Secondary | ICD-10-CM

## 2022-07-12 NOTE — Therapy (Signed)
OUTPATIENT PHYSICAL THERAPY TREATMENT NOTE   Patient Name: Johnny Banks MRN: [id number] DOB:1961/05/24, 61 y.o., male Today's Date: 07/12/2022  PCP: Nolene Ebbs, MD REFERRING PROVIDER: Laure Kidney, PA-C  END OF SESSION:   PT End of Session - 07/12/22 1315     Visit Number 8    Number of Visits 15    Date for PT Re-Evaluation 09/06/22    Authorization Type MEDICAID [place] ACCESS reporting period from 06/14/2022    Authorization Time Period [employer] auth 8/16-9/26 for 12 PT visits       (max combined 27 PT/OT/SLP per calendar year, auth is required)    Authorization - Visit Number 8    Authorization - Number of Visits 16    Progress Note Due on Visit 10    PT Start Time 5974    PT Stop Time 1343    PT Time Calculation (min) 38 min    Equipment Utilized During Treatment Gait belt    Activity Tolerance Patient tolerated treatment well;Patient limited by fatigue;Patient limited by pain    Behavior During Therapy WFL for tasks assessed/performed                Past Medical History:  Diagnosis Date   Bipolar 1 disorder ([place])    Hypertension    TBI (traumatic brain injury) [facility])    Past Surgical History:  Procedure Laterality Date   APPENDECTOMY     ESOPHAGOGASTRODUODENOSCOPY N/A 12/20/2016   Procedure: ESOPHAGOGASTRODUODENOSCOPY (EGD);  Surgeon: Ronald Lobo, MD;  Location: [facility];  Service: Endoscopy;  Laterality: N/A;   HIP FRACTURE SURGERY     Patient Active Problem List   Diagnosis Date Noted   Bilateral shoulder pain 01/04/2017   Physical deconditioning    Symptomatic anemia    Tobacco abuse    Melena 12/19/2016   Acute blood loss anemia 12/19/2016   ETOH abuse 12/19/2016   UGIB (upper gastrointestinal bleed) 12/19/2016   Hypotension, unspecified 04/04/2013   Sepsis ([place]) 04/02/2013   Bipolar affective disorder ([place]) 04/02/2013   Polysubstance abuse ([place]) 04/02/2013   HTN (hypertension) 04/02/2013   Syncope 04/02/2013   Acute kidney injury  ([place]) 04/02/2013    REFERRING DIAG: other secondary osteoarthritis of right hip  THERAPY DIAG:  Pain in right hip  Stiffness of right hip, not elsewhere classified  Abnormal posture  Difficulty in walking, not elsewhere classified  Rationale for Evaluation and Treatment: Rehabilitation  PERTINENT HISTORY: Patient is a 61 y.o. male who presents to outpatient physical therapy with a referral for medical diagnosis other secondary osteoarthritis of right hip. This patient's chief complaints consist of bilateral hip stiffness and pain, stooped posture, difficulty with mobility S/P R THA posterior approach 05/28/2022 leading to the following functional deficits: difficulty with usual activities including household and community ambulation, transfers, and bed mobility, standing, putting R shoe on, stairs, squatting, preventing/treating genital rash his doctor told him was due to not being able to get up and walk around enough, mobility without falling, etc. Relevant past medical history and comorbidities include current smoker, smoker's cough, HTN, ETOH abuse, bipolar affective disorder, bipolar I disorder, polysubstance abuse, syncope, physical deconditioning, B shoulder pain (patient reports he needs both replaced), appendectomy, L hip pinning and chronic stiffness in B hips, prior R hip pinning, B hip dysfunciton since childhood, low back pain, TBI. Patient denies hx of cancer, stroke, seizures, heart problems, diabetes, unexplained weight loss, unexplained changes in bowel or bladder problems, unexplained stumbling or dropping things, osteoporosis, and spinal  surgery  PRECAUTIONS: Fall  SUBJECTIVE: Patient arrives with RW. States his pain is 5/10 in low back and B shoulders and arms. He state he went to the dollar store in his [facility] after PT on Tuesday with a friend and his W/C broke about 3/4 of the way back. He had to call the police who gave him a ride back home. He no longer has a working W/C which  has made him use his RW more at home and increased the pain and fatigue in his arms and increased the difficulty of ADLs.   PAIN:  Are you having pain? 5/10 in low back  OBJECTIVE  TODAY'S TREATMENT: Therapeutic exercise: to centralize symptoms and improve ROM, strength, muscular endurance, and activity tolerance required for successful completion of functional activities.  -Ambulation on TM focusing on decreasing UE support, 3x2 min at 0.2-0.3 mph. Light UE support as tolerated. Chair provided for seated breaks on TM after each set. Bilateral trendelenburg. left knee/quad pain with L weight bearing and back pain with R weight bearing.  - single seated leg press at [facility] machine, 3x15 each side at 25/35/45# starting with partial ROM on L LE due to ER positioning. Assistance at B LE needed to get on/off plate.   Pt required multimodal cuing for proper technique and to facilitate improved neuromuscular control, strength, range of motion, and functional ability resulting in improved performance and form.    PATIENT EDUCATION:  Education details: Exercise form/purpose.   Reviewed cancelation/no-show policy with patient and confirmed patient has correct phone number for clinic; patient verbalized understanding (06/18/22). Person educated: Patient Education method: Explanation, demonstration, handout Education comprehension: verbalized understanding and needs further education   HOME EXERCISE PROGRAM: Access Code: [email or web address] URL: [email or web address] Date: 06/18/2022 Prepared by: Rosita Kea  Exercises - Sit to Stand with Counter Support  - 1 x daily - 2 sets - 10 reps - supine hip flexor stretch  - 1 x daily - 3 reps - 30-60 seconds hold - Supine Lower Trunk Rotation  - 1 x daily - 1 sets - 20 reps   ASSESSMENT:   CLINICAL IMPRESSION: Patient arrives with increased pain after using RW more after W/C broke this week. He continues to lack LE and postural strength to support  him while walking without heavy use of B UE on RW, resulting in B UE pain. Continued working on improving LE strength and gait. Patient requires extended rest breaks and is slow moving but motivated to participate. Patient continues to be limited by hip flexion contractures and spinal flexion. Plan to continue working on improving gait, ROM, and LE/functional strength as tolerated. Patient would benefit from continued management of limiting condition by skilled physical therapist to address remaining impairments and functional limitations to work towards stated goals and return to PLOF or maximal functional independence.   Patient is a 61 y.o. male referred to outpatient physical therapy with a medical diagnosis of other secondary osteoarthritis of right hip who presents with signs and symptoms consistent with right hip stiffness, weakness, and dysfunction S/P R THA posterior approach 05/28/2022. Patient's presentation is complicated by chronic B hip problems resulting in pinning of both hips since he was 61 years old that restricted B hip ROM and continues to restrict L hip ROM and cause pain. 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 His recent R THA improves his potential for normalized ROM, strength, and function of R hip but his function  will likely continue to be limited by L hip which is still pinned with limited potential ROM available. Patient's rehab will also likely be affected by chronic back pain, B shoulder pain/dysfunction and his history of ETOH and multi-substance abuse, bipolar disorder, and dependence on others for transportation. Patient presents with significant pain, ROM, tissue integrity, posture, gait, balance, muscle performance (strength/power/endurance) and activity tolerance impairments that are limiting ability to complete his usual activities including household and community ambulation, transfers, and bed mobility,  standing, putting R shoe on, stairs, squatting, preventing/treating genital rash his doctor told him was due to not being able to get up and walk around enough, mobility without falling, etc without difficulty. Patient will benefit from skilled physical therapy intervention to address current body structure impairments and activity limitations to improve function and work towards goals set in current POC in order to return to prior level of function or maximal functional improvement.    OBJECTIVE IMPAIRMENTS Abnormal gait, decreased activity tolerance, decreased balance, decreased endurance, decreased knowledge of condition, decreased knowledge of use of DME, decreased mobility, difficulty walking, decreased ROM, decreased strength, hypomobility, increased edema, increased fascial restrictions, impaired perceived functional ability, impaired flexibility, impaired sensation, impaired UE functional use, improper body mechanics, postural dysfunction, obesity, and pain.    ACTIVITY LIMITATIONS carrying, lifting, bending, standing, squatting, stairs, transfers, bed mobility, bathing, toileting, dressing, hygiene/grooming, and locomotion level   PARTICIPATION LIMITATIONS: meal prep, cleaning, laundry, shopping, community activity, and   usual activities including household and community ambulation, transfers, and bed mobility, standing, putting R shoe on, stairs, squatting, preventing/treating genital rash his doctor told him was due to not being able to get up and walk around enough, mobility without falling, etc   PERSONAL FACTORS Fitness, Past/current experiences, Time since onset of injury/illness/exacerbation, Transportation, and 3+ comorbidities:   current smoker, smoker's cough, HTN, ETOH abuse, bipolar affective disorder, bipolar I disorder, polysubstance abuse, syncope, physical deconditioning, B shoulder pain (patient reports he needs both replaced), appendectomy, L hip pinning and chronic stiffness in B  hips, prior R hip pinning, B hip dysfunciton since childhood, low back pain, TBI are also affecting patient's functional outcome.    REHAB POTENTIAL: Fair see comorbid conditions   CLINICAL DECISION MAKING: Evolving/moderate complexity   EVALUATION COMPLEXITY: Moderate     GOALS: Goals reviewed with patient? No   SHORT TERM GOALS: Target date: 06/28/2022   Patient will be independent with initial home exercise program for self-management of symptoms. Baseline: Initial HEP to be provided at visit 2 as appropriate (06/14/22); initial HEP provided at visit 2 (06/18/2022);  Goal status: Met 06/25/2022     LONG TERM GOALS: Target date: 09/06/2022   Patient will be independent with a long-term home exercise program for self-management of symptoms.  Baseline: Initial HEP to be provided at visit 2 as appropriate (06/14/22); initial HEP provided at visit 2 (06/18/2022); participating well (06/25/2022);  Goal status: In-progress   2.  Patient will demonstrate improved FOTO to equal or greater than 52 by visit #13 to demonstrate improvement in overall condition and self-reported functional ability.  Baseline: 43 (06/14/22); 38 at visit #4 (06/25/2022);  Goal status: In-progress   3.  Patient will demonstrate R hip PROM extension to 20 degrees to improve his ability to stand and walk with upright posture.  Baseline:  lacking 20 degrees (06/14/22); Goal status: In-progress   4.  Patient will demonstrate L hip abduction strength 4+/5 to improve his ability to ambulate with a less restrictive assistive device,  such as a SPC. Baseline: 2/5 (06/14/22); Goal status: In-progress   5.  Patient will ambulate 1000 feet with mod I using LRAD during 6 Minute Walk Test to demonstrate improved community and household mobility.  Baseline: to be tested visit 2 as appropriate (06/14/22); 305 feet with RW, no rests (06/18/2022); 290 feet with RW, 3 brief standing rests, complains of B UE fatigue from heavy use of RW  and very stooped over posture (06/25/2022); Goal status: In-progress   6.  Patient will complete 5 Time Sit to Stand test in equal or less than 12 seconds from 18.5 inch plinth with no UE support to improve his fall risk and ability to transfer safely in his home.  Baseline: 54 seconds from 18.5 inch plinth with B UE support on plinth and RW in front. (06/14/2022); Goal status: in progress       PLAN: PT FREQUENCY: 1-2x/week   PT DURATION: 12 weeks   PLANNED INTERVENTIONS: Therapeutic exercises, Therapeutic activity, Neuromuscular re-education, Balance training, Gait training, Patient/Family education, Stair training, DME instructions, Electrical stimulation, Spinal mobilization, Cryotherapy, Moist heat, scar mobilization, Manual therapy, and Re-evaluation   PLAN FOR NEXT SESSION: continue the following as insurance auth allows: update HEP as appropriate, interventions for improving hip ROM, LE and functional strength and balance exercises as appropriate.      Nancy Nordmann, PT, DPT 07/12/2022, 1:45 PM  [facility] [address] [place], [place] [address] P: [phone] I F: [phone]

## 2022-07-17 ENCOUNTER — Ambulatory Visit: Payer: Medicaid Other | Admitting: Physical Therapy

## 2022-07-19 ENCOUNTER — Ambulatory Visit: Payer: Medicaid Other | Admitting: Physical Therapy

## 2022-07-23 ENCOUNTER — Ambulatory Visit: Payer: Medicaid Other | Admitting: Physical Therapy

## 2022-07-25 ENCOUNTER — Encounter: Payer: Medicaid Other | Admitting: Physical Therapy

## 2022-07-31 ENCOUNTER — Ambulatory Visit: Payer: Medicaid Other | Admitting: Physical Therapy

## 2022-08-02 ENCOUNTER — Ambulatory Visit: Payer: Medicaid Other | Attending: Physician Assistant | Admitting: Physical Therapy

## 2022-08-02 ENCOUNTER — Encounter: Payer: Self-pay | Admitting: Physical Therapy

## 2022-08-02 DIAGNOSIS — M25551 Pain in right hip: Secondary | ICD-10-CM | POA: Insufficient documentation

## 2022-08-02 DIAGNOSIS — M25651 Stiffness of right hip, not elsewhere classified: Secondary | ICD-10-CM | POA: Insufficient documentation

## 2022-08-02 DIAGNOSIS — R293 Abnormal posture: Secondary | ICD-10-CM | POA: Insufficient documentation

## 2022-08-02 DIAGNOSIS — R262 Difficulty in walking, not elsewhere classified: Secondary | ICD-10-CM | POA: Diagnosis present

## 2022-08-02 NOTE — Therapy (Signed)
OUTPATIENT PHYSICAL THERAPY TREATMENT NOTE / DISCHARGE SUMMARY Dates of reporting from 06/14/2022 to 08/02/2022   Patient Name: Johnny Banks MRN: [id number] DOB:04/29/1961, 61 y.o., male Today's Date: 08/02/2022  PCP: Nolene Ebbs, MD REFERRING PROVIDER: Laure Kidney, PA-C  END OF SESSION:   PT End of Session - 08/02/22 1632     Visit Number 9    Number of Visits 15    Date for PT Re-Evaluation 09/06/22    Authorization Type MEDICAID [place] ACCESS reporting period from 06/14/2022    Authorization Time Period [employer] auth 8/16-9/26 for 12 PT visits       (max combined 27 PT/OT/SLP per calendar year, auth is required)    Authorization - Visit Number 9    Authorization - Number of Visits 16    Progress Note Due on Visit 10    PT Start Time 1607    PT Stop Time 1648    PT Time Calculation (min) 41 min    Equipment Utilized During Treatment Gait belt    Activity Tolerance Patient tolerated treatment well;Patient limited by fatigue;Patient limited by pain    Behavior During Therapy WFL for tasks assessed/performed             Past Medical History:  Diagnosis Date   Bipolar 1 disorder ([place])    Hypertension    TBI (traumatic brain injury) [facility])    Past Surgical History:  Procedure Laterality Date   APPENDECTOMY     ESOPHAGOGASTRODUODENOSCOPY N/A 12/20/2016   Procedure: ESOPHAGOGASTRODUODENOSCOPY (EGD);  Surgeon: Ronald Lobo, MD;  Location: [facility];  Service: Endoscopy;  Laterality: N/A;   HIP FRACTURE SURGERY     Patient Active Problem List   Diagnosis Date Noted   Bilateral shoulder pain 01/04/2017   Physical deconditioning    Symptomatic anemia    Tobacco abuse    Melena 12/19/2016   Acute blood loss anemia 12/19/2016   ETOH abuse 12/19/2016   UGIB (upper gastrointestinal bleed) 12/19/2016   Hypotension, unspecified 04/04/2013   Sepsis ([place]) 04/02/2013   Bipolar affective disorder ([place]) 04/02/2013   Polysubstance abuse ([place]) 04/02/2013   HTN  (hypertension) 04/02/2013   Syncope 04/02/2013   Acute kidney injury ([place]) 04/02/2013    REFERRING DIAG: other secondary osteoarthritis of right hip  THERAPY DIAG:  Pain in right hip  Stiffness of right hip, not elsewhere classified  Abnormal posture  Difficulty in walking, not elsewhere classified  Rationale for Evaluation and Treatment: Rehabilitation  PERTINENT HISTORY: Patient is a 61 y.o. male who presents to outpatient physical therapy with a referral for medical diagnosis other secondary osteoarthritis of right hip. This patient's chief complaints consist of bilateral hip stiffness and pain, stooped posture, difficulty with mobility S/P R THA posterior approach 05/28/2022 leading to the following functional deficits: difficulty with usual activities including household and community ambulation, transfers, and bed mobility, standing, putting R shoe on, stairs, squatting, preventing/treating genital rash his doctor told him was due to not being able to get up and walk around enough, mobility without falling, etc. Relevant past medical history and comorbidities include current smoker, smoker's cough, HTN, ETOH abuse, bipolar affective disorder, bipolar I disorder, polysubstance abuse, syncope, physical deconditioning, B shoulder pain (patient reports he needs both replaced), appendectomy, L hip pinning and chronic stiffness in B hips, prior R hip pinning, B hip dysfunciton since childhood, low back pain, TBI. Patient denies hx of cancer, stroke, seizures, heart problems, diabetes, unexplained weight loss, unexplained changes in bowel or bladder problems, unexplained  stumbling or dropping things, osteoporosis, and spinal surgery  PRECAUTIONS: Fall  SUBJECTIVE: Patient arrives with RW. States that about the only part of his body that doesn't hurt is his right hip. He states he has been having a lot of pain in his left hip and knee like he had in the right side before the surgery. He saw his  surgeon who did an xray that looked good. That day he fell out of bed onto his right hip and he hopes he didn't hurt it. He states it has been a little sore but getting better. He has been told by surgeon to call when he is ready to get his left hip replaced. He wonders if he should get it done sooner rather than later because it is really limiting his mobility more than the right hip.   PAIN:  Are you having pain? 7/10 in left hip and knee, 8-9/10 low back  OBJECTIVE  SELF-REPORTED FUNCTION FOTO score: 47/100 (hip questionnaire)  PERIPHERAL JOINT MOTION (in degrees)   PASSIVE RANGE OF MOTION (PROM) *Indicates pain 06/14/22 06/25/22 08/02/22  Joint/Motion R/L R/L R/L  Hip        Flexion  110/110 / /  Extension  -20/-35 -10/-25 0/  Comments:  06/14/2022: L hip approx - 10 degrees IR in sitting, no ER limitation. R hip IR/ER in seated position limited to approx 20 degrees ER and 10 degrees IR. B knees and ankles appear grossly [facility] for basic mobility.    MUSCLE PERFORMANCE (MMT):  *Indicates pain 06/14/22 06/25/22 08/02/22  Joint/Motion R/L R/L R/L  Hip        Flexion (L1, L2) 2/2 / /  Abduction 2/1 2/2 3/  Knee        Extension (L3) 5/5 / /  Flexion (S2) 5/5 / /  Ankle/Foot        Dorsiflexion (L4) 5/5 / /  Great toe extension (L5) 4+/4+ / /  Eversion (S1) 5/5 / /  Plantarflexion (S1) 4/4 / /  Comments:  06/14/22 and 06/25/22: Unable to extend either hip to neutral for hip abduction testing.      FUNCTIONAL/BALANCE TESTS (last tested 06/25/2022);: 6 Min Walk Test: 290 feet with RW, 3 brief standing rests, complains of B UE fatigue from heavy use of RW and very stooped over posture.    Five Time Sit to Stand (5TSTS): 29 seconds from 18.5 inch plinth with B UE support on plinth and RW in front.     TODAY'S TREATMENT: Therapeutic exercise: to centralize symptoms and improve ROM, strength, muscular endurance, and activity tolerance required for successful completion of functional  activities.  - Ambulation on TM focusing on decreasing UE support, 3x2 min at 0.5 mph. Light UE support as tolerated. Chair provided for seated breaks on TM after each set. Unable to take too much pressure of B UE due to feeling that L LE will "give out." - education on expected rehab with L THA. - single seated leg press at [place], 2x15 R side at 45#, 2x15 on left at 25# with partial ROM on L LE due to ER positioning (unable to push more than 25# on L due to pain). Assistance at [place] needed to get on/off plate. - measurements to assess progress (see above).  - sidelying R hip abduction 1x10 (some hip flexion compensation).  - supine R hip flexor stretch with R leg off edge of table, 1x10 seconds.  - Education on HEP including handout   Pt  required multimodal cuing for proper technique and to facilitate improved neuromuscular control, strength, range of motion, and functional ability resulting in improved performance and form.    PATIENT EDUCATION:  Education details: Exercise form/purpose.   Reviewed cancelation/no-show policy with patient and confirmed patient has correct phone number for clinic; patient verbalized understanding (06/18/22). Person educated: Patient Education method: Explanation, demonstration, handout Education comprehension: verbalized understanding and needs further education   HOME EXERCISE PROGRAM: Access Code: [email or web address] URL: [email or web address] Date: 08/02/2022 Prepared by: Rosita Kea  Exercises - Sit to Stand with Counter Support  - 1 x daily - 2 sets - 10 reps - Supine Lower Trunk Rotation  - 1 x daily - 1 sets - 20 reps - Modified Thomas Stretch  - 1-2 x daily - 3 reps - 30-60 seconds hold - Sidelying Hip Abduction  - 1 x daily - 1-3 sets - 10 reps - 2 seconds hold   ASSESSMENT:   CLINICAL IMPRESSION: Patient has attended 9 physical therapy session since starting current episode of care on 08/02/2022. Patient was last seen 3 weeks ago  and arrives stating he feels his right hip is the most functional part of his body and he does not feel he needs further PT currently for R hip. After input from PT, he plans to reach out to surgeon to schedule L hip replacement with hopes of returning to PT following that surgery due to the amount the left hip is limiting his mobility and potential for functional in improvement in PT. Physical therapist agrees with this plan. Patient has made significant progress in R hip extension ROM, R hip abduction strength, and FOTO score. He continues to be dependent on the RW for mobility and cannot stand up straight related to L > R hip stiffness and low back stiffness. Patient's HEP was updated to include continued stretching and strengthening for R hip. Patient is now discharged from PT.    From PT eval 06/14/2022:  Patient is a 61 y.o. male referred to outpatient physical therapy with a medical diagnosis of other secondary osteoarthritis of right hip who presents with signs and symptoms consistent with right hip stiffness, weakness, and dysfunction S/P R THA posterior approach 05/28/2022. Patient's presentation is complicated by chronic B hip problems resulting in pinning of both hips since he was 61 years old that restricted B hip ROM and continues to restrict L hip ROM and cause pain. 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 His recent R THA improves his potential for normalized ROM, strength, and function of R hip but his function will likely continue to be limited by L hip which is still pinned with limited potential ROM available. Patient's rehab will also likely be affected by chronic back pain, B shoulder pain/dysfunction and his history of ETOH and multi-substance abuse, bipolar disorder, and dependence on others for transportation. Patient presents with significant pain, ROM, tissue integrity, posture, gait, balance, muscle performance  (strength/power/endurance) and activity tolerance impairments that are limiting ability to complete his usual activities including household and community ambulation, transfers, and bed mobility, standing, putting R shoe on, stairs, squatting, preventing/treating genital rash his doctor told him was due to not being able to get up and walk around enough, mobility without falling, etc without difficulty. Patient will benefit from skilled physical therapy intervention to address current body structure impairments and activity limitations to improve function and work towards goals set in current POC in order to  return to prior level of function or maximal functional improvement.    OBJECTIVE IMPAIRMENTS Abnormal gait, decreased activity tolerance, decreased balance, decreased endurance, decreased knowledge of condition, decreased knowledge of use of DME, decreased mobility, difficulty walking, decreased ROM, decreased strength, hypomobility, increased edema, increased fascial restrictions, impaired perceived functional ability, impaired flexibility, impaired sensation, impaired UE functional use, improper body mechanics, postural dysfunction, obesity, and pain.    ACTIVITY LIMITATIONS carrying, lifting, bending, standing, squatting, stairs, transfers, bed mobility, bathing, toileting, dressing, hygiene/grooming, and locomotion level   PARTICIPATION LIMITATIONS: meal prep, cleaning, laundry, shopping, community activity, and   usual activities including household and community ambulation, transfers, and bed mobility, standing, putting R shoe on, stairs, squatting, preventing/treating genital rash his doctor told him was due to not being able to get up and walk around enough, mobility without falling, etc   PERSONAL FACTORS Fitness, Past/current experiences, Time since onset of injury/illness/exacerbation, Transportation, and 3+ comorbidities:   current smoker, smoker's cough, HTN, ETOH abuse, bipolar affective  disorder, bipolar I disorder, polysubstance abuse, syncope, physical deconditioning, B shoulder pain (patient reports he needs both replaced), appendectomy, L hip pinning and chronic stiffness in B hips, prior R hip pinning, B hip dysfunciton since childhood, low back pain, TBI are also affecting patient's functional outcome.    REHAB POTENTIAL: Fair see comorbid conditions   CLINICAL DECISION MAKING: Evolving/moderate complexity   EVALUATION COMPLEXITY: Moderate     GOALS: Goals reviewed with patient? No   SHORT TERM GOALS: Target date: 06/28/2022   Patient will be independent with initial home exercise program for self-management of symptoms. Baseline: Initial HEP to be provided at visit 2 as appropriate (06/14/22); initial HEP provided at visit 2 (06/18/2022);  Goal status: Met 06/25/2022     LONG TERM GOALS: Target date: 09/06/2022   Patient will be independent with a long-term home exercise program for self-management of symptoms.  Baseline: Initial HEP to be provided at visit 2 as appropriate (06/14/22); initial HEP provided at visit 2 (06/18/2022); participating well (06/25/2022); met (08/02/2022):  Goal status: met (08/02/2022):    2.  Patient will demonstrate improved FOTO to equal or greater than 52 by visit #13 to demonstrate improvement in overall condition and self-reported functional ability.  Baseline: 43 (06/14/22); 38 at visit #4 (06/25/2022); 47 at visit #9 (08/02/2022);  Goal status: partially met   3.  Patient will demonstrate R hip PROM extension to 20 degrees to improve his ability to stand and walk with upright posture.  Baseline:  lacking 20 degrees (06/14/22); 0 degrees (08/02/2022); Goal status: partially met   4.  Patient will demonstrate R hip abduction strength 4+/5 to improve his ability to ambulate with a less restrictive assistive device, such as a SPC. Baseline: 2/5 (06/14/22); 3/5 (08/02/22); Goal status: partially met   5.  Patient will ambulate 1000 feet  with mod I using LRAD during 6 Minute Walk Test to demonstrate improved community and household mobility.  Baseline: to be tested visit 2 as appropriate (06/14/22); 305 feet with RW, no rests (06/18/2022); 290 feet with RW, 3 brief standing rests, complains of B UE fatigue from heavy use of RW and very stooped over posture (06/25/2022); Goal status: not met   6.  Patient will complete 5 Time Sit to Stand test in equal or less than 12 seconds from 18.5 inch plinth with no UE support to improve his fall risk and ability to transfer safely in his home.  Baseline: 54 seconds from 18.5 inch plinth with  B UE support on plinth and RW in front. (06/14/2022); Goal status: not met       PLAN: PT FREQUENCY: 1-2x/week   PT DURATION: 12 weeks   PLANNED INTERVENTIONS: Therapeutic exercises, Therapeutic activity, Neuromuscular re-education, Balance training, Gait training, Patient/Family education, Stair training, DME instructions, Electrical stimulation, Spinal mobilization, Cryotherapy, Moist heat, scar mobilization, Manual therapy, and Re-evaluation   PLAN FOR NEXT SESSION: patient now discharged to long term HEP in preparation for THA on L side.      Nancy Nordmann, PT, DPT 08/02/2022, 8:15 PM  [facility] [address] [place], [place] [address] P: [phone] I F: [phone]

## 2022-08-06 ENCOUNTER — Ambulatory Visit: Payer: Medicaid Other | Admitting: Physical Therapy

## 2022-08-08 ENCOUNTER — Ambulatory Visit: Payer: Medicaid Other | Admitting: Physical Therapy

## 2022-08-13 ENCOUNTER — Encounter: Payer: Medicaid Other | Admitting: Physical Therapy

## 2022-08-16 ENCOUNTER — Encounter: Payer: Medicaid Other | Admitting: Physical Therapy

## 2022-08-21 ENCOUNTER — Encounter: Payer: Medicaid Other | Admitting: Physical Therapy

## 2022-08-23 ENCOUNTER — Encounter: Payer: Medicaid Other | Admitting: Physical Therapy

## 2022-11-28 ENCOUNTER — Telehealth: Payer: Self-pay | Admitting: Physical Therapy

## 2022-11-28 ENCOUNTER — Ambulatory Visit: Payer: Medicaid Other | Admitting: Physical Therapy

## 2022-11-28 NOTE — Telephone Encounter (Signed)
Called patient at listed number (number is for his caregiver [name]) when he did not show up for his PT appointment scheduled for 9am today. Johnny Banks answered and said he had an issue with his scrotum that he wanted to take care of before starting PT. He said the cyst was supposed to be resolved on 12/03/2022. [name] agreed to reschedule his initial PT eval to 12/13/2022. [name] educated that it is important for patient/him to let us know if patient is not going to be able to come to visits. [name] said he would save the clinic phone number in his phone and let us know if patient is not able to come in the future.   Support staff updated on situation.   [name]. [name], PT, DPT 11/28/22, 9:17 AM  [facility] [address] [place], [place] [address] P: [phone] I F: [phone]

## 2022-12-03 ENCOUNTER — Ambulatory Visit: Payer: Medicaid Other | Admitting: Dermatology

## 2022-12-05 ENCOUNTER — Ambulatory Visit: Payer: Medicaid Other | Admitting: Physical Therapy

## 2022-12-13 ENCOUNTER — Encounter: Payer: Self-pay | Admitting: Physical Therapy

## 2022-12-13 ENCOUNTER — Ambulatory Visit: Payer: Medicaid Other | Attending: Physician Assistant | Admitting: Physical Therapy

## 2022-12-13 ENCOUNTER — Encounter: Payer: Medicaid Other | Admitting: Physical Therapy

## 2022-12-13 DIAGNOSIS — M25552 Pain in left hip: Secondary | ICD-10-CM

## 2022-12-13 DIAGNOSIS — R293 Abnormal posture: Secondary | ICD-10-CM | POA: Diagnosis present

## 2022-12-13 DIAGNOSIS — R262 Difficulty in walking, not elsewhere classified: Secondary | ICD-10-CM

## 2022-12-13 DIAGNOSIS — M25652 Stiffness of left hip, not elsewhere classified: Secondary | ICD-10-CM | POA: Diagnosis present

## 2022-12-13 NOTE — Therapy (Signed)
OUTPATIENT PHYSICAL THERAPY EVALUATION   Patient Name: Johnny Banks MRN: [id number] DOB:[birth year], 62 y.o., male Today's Date: 12/13/2022  END OF SESSION:  PT End of Session - 12/13/22 0914     Visit Number 1    Number of Visits 24    Date for PT Re-Evaluation 03/07/23    Authorization Type MEDICAID [place] ACCESS reporting period from 12/13/2022    Authorization - Visit Number 1    Authorization - Number of Visits 1    Progress Note Due on Visit 10    PT Start Time 0900    PT Stop Time 0940    PT Time Calculation (min) 40 min    Activity Tolerance Patient tolerated treatment well    Behavior During Therapy WFL for tasks assessed/performed             Past Medical History:  Diagnosis Date   Bipolar 1 disorder ([place])    Hypertension    TBI (traumatic brain injury) [facility])    Past Surgical History:  Procedure Laterality Date   APPENDECTOMY     ESOPHAGOGASTRODUODENOSCOPY N/A 12/20/2016   Procedure: ESOPHAGOGASTRODUODENOSCOPY (EGD);  Surgeon: Ronald Lobo, MD;  Location: [facility];  Service: Endoscopy;  Laterality: N/A;   HIP FRACTURE SURGERY     Patient Active Problem List   Diagnosis Date Noted   Bilateral shoulder pain 01/04/2017   Physical deconditioning    Symptomatic anemia    Tobacco abuse    Melena 12/19/2016   Acute blood loss anemia 12/19/2016   ETOH abuse 12/19/2016   UGIB (upper gastrointestinal bleed) 12/19/2016   Hypotension, unspecified 04/04/2013   Sepsis ([place]) 04/02/2013   Bipolar affective disorder ([place]) 04/02/2013   Polysubstance abuse ([place]) 04/02/2013   HTN (hypertension) 04/02/2013   Syncope 04/02/2013   Acute kidney injury ([place]) 04/02/2013    PCP: Nolene Ebbs, MD  REFERRING PROVIDER: Luanna Salk, PA  REFERRING DIAG: s/p total replacement of left hip  THERAPY DIAG:  Pain in left hip  Stiffness of left hip, not elsewhere classified  Abnormal posture  Difficulty in walking, not elsewhere classified  Rationale for  Evaluation and Treatment: Rehabilitation  ONSET DATE: chronic L hip pain since childhood, s/p L THA 09/17/2022  SUBJECTIVE:   SUBJECTIVE STATEMENT: Patient is known to this clinic and PT from previous episode of care after right THA. He is here following L THA on 09/17/2022. He reports he has had a lot more trouble with his L THA compared to the right. He has not had any physical therapy since his left THA. He states the area of his greater trochanter stuck way out after surgery and he kept bumping it with his hand. He states radiographs show everything is in the correct place and healing appropriately. He states his surgeon referred him to PT since he was still having trouble with pain in weight bearing through left hip. He still lives in the same group home with the same assistance. He was given a new W/C when discharged from hospital, but it is too big to get through the doors at his home. He states he is really happy that both of his hips move better and he is able to sit straight. He states at first he was unable to don his shoes and socks independently, but now he is able to with some difficulty. Prior to his hip replacements, he had his hips pinned due to apparent dysplasia as a child. 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 He states prior to his hips worsening, he was fully I with all mobility and went to the gym regularly. Since his worsening difficulty with hips before THAs he was using a W/C most of the time. Since his R THA he has been using his RW more for mobility. He states he did some of the HEP after discharging from his last episode of care of PT s/p R THA, but "not like I should." He also has back problems that cause a stooped posture and decreased lumbar lordosis. He reports he has B shoulder problems and also needs them both replaced but he has to rely on his B UE for ambulation, so this is not possible yet. He states his B shoulders hurt more than  his hips now and are the most limiting factor in ambulation with RW.   PERTINENT HISTORY: Patient is a 62 y.o. male who presents to outpatient physical therapy with a referral for medical diagnosis s/p total replacement of left hip. This patient's chief complaints consist of left hip pain, weakness, and stiffness, difficulty weight bearing, stooped posture, difficulty with mobility S/P L THA posterior approach 09/17/2022 leading to the following functional deficits: difficulty with usual activities including household and community ambulation, transfers, and bed mobility, standing, stairs, squatting, mobility without falling, walking without walker, avoiding falls, being steady on feet, walking for exercise, walking downtown, standing up straight, unable to have shoulders replaced since he is dependent on UE weight bearing while walking, getting in and out of the car, dressing. Relevant past medical history and comorbidities include s/p R THA 06/14/2022, osteopenia in left hip, current smoker, smoker's cough, HTN, ETOH abuse, bipolar affective disorder, bipolar I disorder, polysubstance abuse, syncope, physical deconditioning, B shoulder pain (patient reports he needs both replaced), appendectomy, chronic stiffness in B hips, prior B hip pinning, B hip dysfunciton since childhood, low back pain, TBI. Patient denies hx of cancer, stroke, seizures, heart problems, diabetes, unexplained weight loss, unexplained changes in bowel or bladder problems, unexplained stumbling or dropping things, osteoporosis, and spinal surgery.   PAIN:  Are you having pain? Yes: NPRS scale: Current: 0/10 sitting up to 3/10 while walking,  Best: 0/10, Worst: 5/10. Pain location: lateral left hip, anterior hip and radiates down to anterior thigh to knee.  Pain description: skin feels like a bandaid, achy pain. Numbness over incision site. Denies numbness/tingling down his leg.  Aggravating factors: walking, weight bearing through left  LE. Relieving factors: liquor, sitting down and taking weight off of it.   FUNCTIONAL LIMITATIONS: difficulty walking without a walker, avoiding falls, being steady on feet, walking for exercise, walking downtown, standing up straight, unable to have shoulders replaced since he is dependent on UE weight bearing while walking, getting in and out of the car, dressing,   PRECAUTIONS: Fall,   WEIGHT BEARING RESTRICTIONS: No  FALLS:  Has patient fallen in last 6 months? No  LIVING ENVIRONMENT: Lives with:  5 other adults/clients and nurses aide/staff Lives in: Group home Stairs: 3 with handrails on both sides but cannot reach both, has a ramp Has following equipment at home: Dan Humphreys - 2 wheeled, Wheelchair (manual), shower chair, Grab bars, and tub/shower, toilet riser with grab bars.  OCCUPATION: disability  PLOF: needed help with meals and medications, transportation. I with basic mobility using W/C and RW, dressing himself. Lives in group home. Before his hips got worse reports he lifted weights and was completely I with ambulation and mobility.   PATIENT GOALS: "be out of  pain and walk like a normal person" "be able to walk down town and burn some blubber off" "walk without pain anywhere"  NEXT MD VISIT: in a couple of months to see if PT will take care of the pain.   OBJECTIVE  DIAGNOSTIC FINDINGS:  L hip xray report 11/15/2022: INDICATION: post-op \ M57.846 Status post total replacement of left hip  COMPARISON: 10/03/2022   CONCLUSION:   1. Bilateral total hip arthroplasties without periprosthetic fracture or osteolysis.  2.  No acute fracture or malalignment.  3.  Mild degenerative changes of the SI joints.  4.  Degenerative changes of the partially imaged lumbar spine.  5.  Osteopenia.   B hip xray report 05/08/2022: EXAM: DG HIP (WITH OR WITHOUT PELVIS) 3-4V BILAT; CHEST  1 VIEW   COMPARISON:  Chest PA Lat 09/11/2013 AP pelvis and bilateral hips dated 08/31/2021 AP  pelvis 09/22/2015.   FINDINGS: Chest:   The heart silhouette has become moderately enlarged since the prior study. A widened superior mediastinum is noted most likely positional or technique related and/or due to lipomatosis.   There is perihilar vascular prominence which could be positional or technique related with no overt findings of pulmonary edema. No focal lung infiltrate or pleural effusion is seen, no pneumothorax or displaced rib fractures. Mild thoracic dextroscoliosis.   AP pelvis and bilateral hip views:   There is normal bone mineralization. There are advanced degenerative changes of the visualized lower lumbar spine.   There is no AP evidence of pelvic fracture or diastasis with the SI joints and pubic symphysis unremarkable, as visualized.   Four pins are again noted drilled through the right femoral neck into the head and 3 on the left.   Again the left hip pins appear to extend through the flattened femoral head a short distance into the acetabular bone which was seen on both of the prior studies.   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   There is exuberant osteophyte formation about both hips with bone-on-bone joint space loss. No proximal femoral fracture is seen.   IMPRESSION: 1. Cardiomegaly developed since 2014 chest study, with central vascular prominence versus vascular exaggeration related to positioning and technique. There are no findings of pulmonary edema or acute pneumonia. 2. 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 3.  Advanced degenerative changes of the visualized lower lumbar spine.     Electronically Signed   By: Telford Nab M.D.   On: 05/08/2022 20:15  SELF- REPORTED FUNCTION FOTO score: 36/100 (hip questionnaire)  OBSERVATION/INSPECTION Posture Posture (seated): forward head, rounded shoulders, slumped in sitting.  Posture (standing): 12 degrees forward flexion at low back/hip when standing without UE support on RW.  Anthropometrics Tremor: none Body composition: 33 Skin: The incision site not visualized, pt reports no concerns.  Functional Mobility Bed mobility: supine <> sit and rolling with mod I due to increased effort/time Transfers: sit <> stand mod I due to increased effort/time and need for B UE support.  Gait: ambulates with heavy B UE support on RW or significant L antalgic gait with stooped posture for 6 min. Reports pain with L LE weight bearing  and B shoulder pain with weight bearing through B UE. Is able to lighten B UE support significantly with cuing, resulting in significant increase in antalgic gait favoring L LE.   NEUROLOGICAL Dermatomes L2-S2 appears equal and intact to light touch    PERIPHERAL JOINT MOTION (in degrees)  PASSIVE RANGE OF MOTION (PROM) *Indicates pain 12/13/22 Date Date  Joint/Motion R/L R/L R/L  Hip     Flexion  120/120 / /  Extension  0/-18 / /  Abduction 18/10 / /  Adduction / / /  External rotation 40/40 / /  Internal rotation  30/30 / /  12/13/2022: B knee and ankles appear grossly WFL except slight lack of knee extension on left and lacks full ankle PF bilaterally.   MUSCLE PERFORMANCE (MMT):  *Indicates pain 12/13/22 Date Date  Joint/Motion R/L R/L R/L  Hip     Flexion (L1, L2) 4-/4+ / /  Abduction 2/3+ / /  External rotation 4-/4+ / /  Internal rotation  4+/4- / /  Knee     Extension (L3) 5/5 / /  Flexion (S2) 4+/4+ / /  Ankle/Foot     Dorsiflexion (L4) 5/5 / /  Great toe extension (L5) 4+/4+ / /  Eversion (S1) 5/5 / /   Plantarflexion (S1) 4/5 / /   PALPATION: Minimal tenderness with palpation over L greater trochanter  FUNCTIONAL/BALANCE TESTS: Six Minute Walk Test ( ): 600 feet with heavy B UE support on RW, antalgic gait favoring L LE when he tries to walk with less UE support. HR 115 bpm and SpO2 98% by end of walk.   Single Leg Stance: > 1 second on R, unable on left due to pain and weakness and trendelenburg sign.   Five Time Sit to Stand (5TSTS): 19 seconds from 18.5 inch plinth with use of B UE on table, RW in front for safety.    TODAY'S TREATMENT:    Education with suggestion of 3x10 sit to stand daily and supine laying with L hip flexor stretch.   PATIENT EDUCATION:  Education details: Exercise purpose/form. Self management techniques. Education on diagnosis, prognosis, POC, anatomy and physiology of current condition Education on HEP including handout  Reviewed cancelation/no-show policy with patient; patient verbalized understanding (12/13/22). Person educated: Patient Education method: Explanation Education comprehension: verbalized understanding and needs further education  HOME EXERCISE PROGRAM: Verbally delivered recommendation of 3x10 sit to stand daily and supine laying with L hip flexor stretch. To be updated visit 2 as appropriate.   ASSESSMENT:  CLINICAL IMPRESSION: Patient is a 62 y.o. male referred to outpatient physical therapy with a medical diagnosis of s/p total replacement of left hip who presents with signs and symptoms consistent with left hip flexion contracture, left hip weakness, generalized weakness and unsteadiness on feet keeping him from ambulating independently s/p L THA on 09/17/2022. 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 Patient had R THA performed in August 2023 but was still very limited in reaching upright standing posture due to pain and stiffness at left hip and lumbar spine stiffness. He now has much better  potential for improvement following joint replacements in both hips. He is very weak from prolonged inability to stand or move in an upright posture and has remaining stiffness in L > R hip from prolonged joint dysfunction prior to surgeries. Patient appears to have excellent potential to return to ambulation with no AD but will require prolonged strengthening and rehab  due to the severity of his current weakness in B hips (L>R) especially hip abduction that does not yet let him maintain SLS (MML 2/5 on left and 3+/5 on right). Patient presents with significant pain, ROM, muscle length, joint stiffness, spine posture, balance, proprioceptive, motor control, gait, muscle performance (strength/power/endurance), and activity tolerance impairments that are limiting ability to complete usual activities including household and community ambulation, transfers, and bed mobility, standing, stairs, squatting, mobility without falling, walking without walker, avoiding falls, being steady on feet, walking for exercise, walking downtown, standing up straight, unable to have shoulders replaced since he is dependent on UE weight bearing while walking, getting in and out of the car, dressing without difficulty. Patient will benefit from skilled physical therapy intervention to address current body structure impairments and activity limitations to improve function and work towards goals set in current POC in order to return to prior level of function or maximal functional improvement.    OBJECTIVE IMPAIRMENTS: Abnormal gait, decreased activity tolerance, decreased balance, decreased coordination, decreased endurance, decreased knowledge of condition, decreased knowledge of use of DME, decreased mobility, difficulty walking, decreased ROM, decreased strength, hypomobility, increased fascial restrictions, impaired perceived functional ability, increased muscle spasms, impaired flexibility, impaired UE functional use, improper body  mechanics, postural dysfunction, obesity, and pain.   ACTIVITY LIMITATIONS: carrying, lifting, bending, standing, squatting, stairs, transfers, bed mobility, bathing, toileting, dressing, reach over head, locomotion level, and caring for others  PARTICIPATION LIMITATIONS: meal prep, cleaning, laundry, interpersonal relationship, shopping, community activity, yard work, and   difficulty with usual activities including household and community ambulation, transfers, and bed mobility, standing, stairs, squatting, mobility without falling, walking without walker, avoiding falls, being steady on feet, walking for exercise, walking downtown, standing up straight, unable to have shoulders replaced since he is dependent on UE weight bearing while walking, getting in and out of the car, dressing  PERSONAL FACTORS: Fitness, Past/current experiences, Time since onset of injury/illness/exacerbation, and 3+ comorbidities:   s/p R THA 06/14/2022, 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   REHAB POTENTIAL: Excellent  CLINICAL DECISION MAKING: Evolving/moderate complexity  EVALUATION COMPLEXITY: Moderate   GOALS: Goals reviewed with patient? No  SHORT TERM GOALS: Target date: 12/27/2022  Patient will be independent with initial home exercise program for self-management of symptoms. Baseline: Initial HEP provided at IE (12/13/22); Goal status: INITIAL   LONG TERM GOALS: Target date: 03/07/2023  Patient will be independent with a long-term home exercise program for self-management of symptoms.  Baseline: Initial HEP provided at IE (12/13/22); Goal status:  INITIAL  2.  Patient will demonstrate improved FOTO to equal or greater than 49 by visit #13 to demonstrate improvement in overall condition and self-reported functional ability.  Baseline: 36 (12/13/22); Goal status: INITIAL  3.  Patient will demonstrate ability to ambulate equal or greater than 1200 feet on 6 Min Walk Test with [facility] or less restrictive assistive device to help him return to walking for health and exercise in his community.  Baseline: 600 feet with RW (12/13/22); Goal status: INITIAL  4.  Patient will complete 5 Times Sit To Stand from 18.5 inch surface or lower in equal or less than 11 seconds with no UE support to  demonstrate improved fall risk and functional strength for household and community mobility and self care.  Baseline: 19 seconds from 18.5 inch plinth with use of B UE on table, RW in front for safety.  (12/13/22); Goal status: INITIAL  5.  Patient will complete community, work and/or recreational activities with 75% less limitation due to current condition.  Baseline: difficulty with usual activities including household and community ambulation, transfers, and bed mobility, standing, stairs, squatting, mobility without falling, walking without walker, avoiding falls, being steady on feet, walking for exercise, walking downtown, standing up straight, unable to have shoulders replaced since he is dependent on UE weight bearing while walking, getting in and out of the car, dressing (12/13/22); Goal status: INITIAL  6.  Patient will demonstrate the ability to perform single leg stance without trendelenburg for equal or greater than 10 seconds on each side to demonstrate improved hip strength for normal and functional gait pattern for household and community mobility without and AD with decreased risk of falls.  Baseline: > 1 second on R, unable on left due to pain and weakness and trendelenburg sign (12/13/2022);   Goal status: INITIAL   PLAN:  PT FREQUENCY:  1-2x/week  PT DURATION: 12 weeks  PLANNED INTERVENTIONS: Therapeutic exercises, Therapeutic activity, Neuromuscular re-education, Balance training, Gait training, Patient/Family education, Self Care, Joint mobilization, Stair training, DME instructions, Aquatic Therapy, Dry Needling, Electrical stimulation, Spinal mobilization, Cryotherapy, Moist heat, Manual therapy, and Re-evaluation  PLAN FOR NEXT SESSION: update HEP as appropriate, progressive LE/hip/functional strengthening with particular attention to hip abduction, interventions for improved ROM (especially hip and low back extension), balance and gait training as appropriate, manual therapy as needed.    [name]. [name], PT, DPT 12/13/22, 11:43 AM  [facility] [address] [place], [place] [address] P: [phone] I F: [phone]

## 2022-12-18 ENCOUNTER — Ambulatory Visit: Payer: Medicaid Other | Admitting: Physical Therapy

## 2022-12-18 ENCOUNTER — Encounter: Payer: Self-pay | Admitting: Physical Therapy

## 2022-12-18 VITALS — BP 167/96 | HR 76

## 2022-12-18 DIAGNOSIS — M25552 Pain in left hip: Secondary | ICD-10-CM

## 2022-12-18 DIAGNOSIS — R293 Abnormal posture: Secondary | ICD-10-CM

## 2022-12-18 DIAGNOSIS — M25652 Stiffness of left hip, not elsewhere classified: Secondary | ICD-10-CM

## 2022-12-18 DIAGNOSIS — R262 Difficulty in walking, not elsewhere classified: Secondary | ICD-10-CM

## 2022-12-18 NOTE — Therapy (Signed)
OUTPATIENT PHYSICAL THERAPY TREATMENT NOTE   Patient Name: Johnny Banks MRN: [id number] DOB:Nov 12, 1961, 62 y.o., male Today's Date: 12/18/2022  PCP: Nolene Ebbs, MD REFERRING PROVIDER: Cari Caraway, PA   END OF SESSION:   PT End of Session - 12/18/22 1027     Visit Number 2    Number of Visits 24    Date for PT Re-Evaluation 03/07/23    Authorization Type MEDICAID [place] ACCESS reporting period from 12/13/2022    Authorization Time Period auth 2/5-2/16 for 3 PT visits    Authorization - Visit Number 1    Authorization - Number of Visits 3    Progress Note Due on Visit 10    PT Start Time 0947    PT Stop Time 1030    PT Time Calculation (min) 43 min    Activity Tolerance Patient tolerated treatment well    Behavior During Therapy [facility] for tasks assessed/performed             Past Medical History:  Diagnosis Date   Bipolar 1 disorder ([place])    Hypertension    TBI (traumatic brain injury) [facility])    Past Surgical History:  Procedure Laterality Date   APPENDECTOMY     ESOPHAGOGASTRODUODENOSCOPY N/A 12/20/2016   Procedure: ESOPHAGOGASTRODUODENOSCOPY (EGD);  Surgeon: Ronald Lobo, MD;  Location: [facility];  Service: Endoscopy;  Laterality: N/A;   HIP FRACTURE SURGERY     Patient Active Problem List   Diagnosis Date Noted   Bilateral shoulder pain 01/04/2017   Physical deconditioning    Symptomatic anemia    Tobacco abuse    Melena 12/19/2016   Acute blood loss anemia 12/19/2016   ETOH abuse 12/19/2016   UGIB (upper gastrointestinal bleed) 12/19/2016   Hypotension, unspecified 04/04/2013   Sepsis ([place]) 04/02/2013   Bipolar affective disorder ([place]) 04/02/2013   Polysubstance abuse ([place]) 04/02/2013   HTN (hypertension) 04/02/2013   Syncope 04/02/2013   Acute kidney injury ([place]) 04/02/2013    REFERRING DIAG: s/p total replacement of left hip   THERAPY DIAG:  Pain in left hip  Stiffness of left hip, not elsewhere classified  Abnormal  posture  Difficulty in walking, not elsewhere classified  Rationale for Evaluation and Treatment: Rehabilitation  PERTINENT HISTORY: Patient is a 62 y.o. male who presents to outpatient physical therapy with a referral for medical diagnosis s/p total replacement of left hip. This patient's chief complaints consist of left hip pain, weakness, and stiffness, difficulty weight bearing, stooped posture, difficulty with mobility S/P L THA posterior approach 09/17/2022 leading to the following functional deficits: difficulty with usual activities including household and community ambulation, transfers, and bed mobility, standing, stairs, squatting, mobility without falling, walking without walker, avoiding falls, being steady on feet, walking for exercise, walking downtown, standing up straight, unable to have shoulders replaced since he is dependent on UE weight bearing while walking, getting in and out of the car, dressing. Relevant past medical history and comorbidities include s/p R THA 06/14/2022, osteopenia in left hip, current smoker, smoker's cough, HTN, ETOH abuse, bipolar affective disorder, bipolar I disorder, polysubstance abuse, syncope, physical deconditioning, B shoulder pain (patient reports he needs both replaced), appendectomy, chronic stiffness in B hips, prior B hip pinning, B hip dysfunciton since childhood, low back pain, TBI. Patient denies hx of cancer, stroke, seizures, heart problems, diabetes, unexplained weight loss, unexplained changes in bowel or bladder problems, unexplained stumbling or dropping things, osteoporosis, and spinal surgery.    PRECAUTIONS: Falls  SUBJECTIVE:  SUBJECTIVE STATEMENT:  Patient states he is feeling okay this morning except he woke up with a splitting headache. He took ibuprofen  and has had coffee today. He states he took his w/c to downtown [place] and it made him sore in his medial posterior thigh was sore on the left side. He used his legs to propel himself.    PAIN:  Are you having pain? Headache 9/10 "feels like my whole head is filled with lice"   OBJECTIVE:   Vitals:   12/18/22 0954  BP: (!) 167/96  Pulse: 76  SpO2: 99%    TODAY'S TREATMENT:    Therapeutic exercise: to centralize symptoms and improve ROM, strength, muscular endurance, and activity tolerance required for successful completion of functional activities.  - vitals measurement to assess safety of exercise and baseline prior to exercise (see above).  - standing foot tap on TM platform standing at side of TM, 3x10 each side, needs B UE support when standing on left side and no UE support when standing on R LE. CGA and TM bar support for B UE in front of body.  - sit <> stand with no UE support from 18 inch chair with airex pad, 3x10.  - ambulation with SPC/quad cane in R UE with CGA, 3x40 feet.  - education on how to adjust quad cane if he were to get one.  - prone on elbows 5x30 or more seconds  - Education on HEP including handout   Pt required multimodal cuing for proper technique and to facilitate improved neuromuscular control, strength, range of motion, and functional ability resulting in improved performance and form.   PATIENT EDUCATION:  Education details: Exercise purpose/form. Self management techniques. Education on diagnosis, prognosis, POC, anatomy and physiology of current condition Education on HEP including handout  Reviewed cancelation/no-show policy with patient; patient verbalized understanding (12/13/22). Person educated: Patient Education method: Explanation Education comprehension: verbalized understanding and needs further education   HOME EXERCISE PROGRAM: Access Code: [email or web address] URL: [email or web address] Date: 12/18/2022 Prepared by: Norton Blizzard  Exercises - Sit to Stand Without Arm Support  - 1 x daily - 3 sets - 10 reps - Static Prone on Elbows  - 2 x daily - 1 sets - 5 reps - 30 seconds hold   ASSESSMENT:   CLINICAL IMPRESSION: Patient arrives using broken RW (L side panel moves freely). Session focused on LE/functional strengthening focusing on improved B hip abduction and extension as well as practicing ambulating with SPC/quad cane and stretching into hip/lumbar extension. Patient reported he felt like he had walked a mile with the effort and energy it took to complete the exercises and he needed frequent rests. He demonstrated excellent effort and engagement. Patient recommended to get a new RW and a SPC (quad cane if that is all that is available). Patient provided with initial HEP with exercises he demonstrated the ability to perform safely in clinic. Patient unable to stand on L LE single leg stance even briefly without UE support. Plan to start standing hip abduction as tolerated next session. Patient would benefit from continued management of limiting condition by skilled physical therapist to address remaining impairments and functional limitations to work towards stated goals and return to PLOF or maximal functional independence.   From Initial PT Eval 12/13/2022: Patient is a 62 y.o. male referred to outpatient physical therapy with a medical diagnosis of s/p total replacement of left hip who presents with signs and symptoms consistent with left hip flexion contracture, left hip  weakness, generalized weakness and unsteadiness on feet keeping him from ambulating independently s/p L THA on 09/17/2022. 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 Patient had R THA performed in August 2023 but was still very limited in reaching upright standing posture due to pain and stiffness at left hip and lumbar spine stiffness. He now has much better potential for improvement following joint  replacements in both hips. He is very weak from prolonged inability to stand or move in an upright posture and has remaining stiffness in L > R hip from prolonged joint dysfunction prior to surgeries. Patient appears to have excellent potential to return to ambulation with no AD but will require prolonged strengthening and rehab due to the severity of his current weakness in B hips (L>R) especially hip abduction that does not yet let him maintain SLS (MML 2/5 on left and 3+/5 on right). Patient presents with significant pain, ROM, muscle length, joint stiffness, spine posture, balance, proprioceptive, motor control, gait, muscle performance (strength/power/endurance), and activity tolerance impairments that are limiting ability to complete usual activities including household and community ambulation, transfers, and bed mobility, standing, stairs, squatting, mobility without falling, walking without walker, avoiding falls, being steady on feet, walking for exercise, walking downtown, standing up straight, unable to have shoulders replaced since he is dependent on UE weight bearing while walking, getting in and out of the car, dressing without difficulty. Patient will benefit from skilled physical therapy intervention to address current body structure impairments and activity limitations to improve function and work towards goals set in current POC in order to return to prior level of function or maximal functional improvement.      OBJECTIVE IMPAIRMENTS: Abnormal gait, decreased activity tolerance, decreased balance, decreased coordination, decreased endurance, decreased knowledge of condition, decreased knowledge of use of DME, decreased mobility, difficulty walking, decreased ROM, decreased strength, hypomobility, increased fascial restrictions, impaired perceived functional ability, increased muscle spasms, impaired flexibility, impaired UE functional use, improper body mechanics, postural dysfunction,  obesity, and pain.    ACTIVITY LIMITATIONS: carrying, lifting, bending, standing, squatting, stairs, transfers, bed mobility, bathing, toileting, dressing, reach over head, locomotion level, and caring for others   PARTICIPATION LIMITATIONS: meal prep, cleaning, laundry, interpersonal relationship, shopping, community activity, yard work, and   difficulty with usual activities including household and community ambulation, transfers, and bed mobility, standing, stairs, squatting, mobility without falling, walking without walker, avoiding falls, being steady on feet, walking for exercise, walking downtown, standing up straight, unable to have shoulders replaced since he is dependent on UE weight bearing while walking, getting in and out of the car, dressing   PERSONAL FACTORS: Fitness, Past/current experiences, Time since onset of injury/illness/exacerbation, and 3+ comorbidities:   s/p R THA 06/14/2022, 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    REHAB POTENTIAL: Excellent   CLINICAL DECISION MAKING: Evolving/moderate complexity   EVALUATION COMPLEXITY: Moderate     GOALS: Goals reviewed with patient? No   SHORT TERM GOALS: Target date: 12/27/2022   Patient will be independent with initial home exercise program for self-management of symptoms. Baseline: Initial HEP provided at IE (12/13/22); Goal status: In-progress     LONG TERM  GOALS: Target date: 03/07/2023   Patient will be independent with a long-term home exercise program for self-management of symptoms.  Baseline: Initial HEP provided at IE (12/13/22); Goal status: In-progress    2.  Patient will demonstrate improved FOTO to equal or greater than 49 by visit #13 to demonstrate improvement in overall condition and self-reported functional ability.  Baseline: 36 (12/13/22); Goal status: INITIAL   3.  Patient will demonstrate ability to ambulate equal or greater than 1200 feet on 6 Min Walk Test with [facility] or less restrictive assistive device to help him return to walking for health and exercise in his community.  Baseline: 600 feet with RW (12/13/22); Goal status: In-progress   4.  Patient will complete 5 Times Sit To Stand from 18.5 inch surface or lower in equal or less than 11 seconds with no UE support to demonstrate improved fall risk and functional strength for household and community mobility and self care.  Baseline: 19 seconds from 18.5 inch plinth with use of B UE on table, RW in front for safety.  (12/13/22); Goal status: In-progress   5.  Patient will complete community, work and/or recreational activities with 75% less limitation due to current condition.  Baseline: difficulty with usual activities including household and community ambulation, transfers, and bed mobility, standing, stairs, squatting, mobility without falling, walking without walker, avoiding falls, being steady on feet, walking for exercise, walking downtown, standing up straight, unable to have shoulders replaced since he is dependent on UE weight bearing while walking, getting in and out of the car, dressing (12/13/22); Goal status: In-progress   6.  Patient will demonstrate the ability to perform single leg stance without trendelenburg for equal or greater than 10 seconds on each side to demonstrate improved hip strength for normal and functional gait pattern for household and community mobility without and AD with decreased risk of falls.  Baseline: > 1 second on R, unable on left due to pain and weakness and trendelenburg sign (12/13/2022);   Goal status: In-progress     PLAN:   PT  FREQUENCY: 1-2x/week   PT DURATION: 12 weeks   PLANNED INTERVENTIONS: Therapeutic exercises, Therapeutic activity, Neuromuscular re-education, Balance training, Gait training, Patient/Family education, Self Care, Joint mobilization, Stair training, DME instructions, Aquatic Therapy, Dry Needling, Electrical stimulation, Spinal mobilization, Cryotherapy, Moist heat, Manual therapy, and Re-evaluation   PLAN FOR NEXT SESSION: update HEP as appropriate, progressive LE/hip/functional strengthening with particular attention to hip abduction, interventions for improved ROM (especially hip and low back extension), balance and gait training as appropriate, manual therapy as needed.    Nancy Nordmann, PT, DPT 12/18/2022, 10:49 AM  [facility] [address] [place], [place] [address] P: [phone] I F: [phone]

## 2022-12-25 ENCOUNTER — Ambulatory Visit: Payer: Medicaid Other | Admitting: Physical Therapy

## 2022-12-27 ENCOUNTER — Encounter: Payer: Self-pay | Admitting: Physical Therapy

## 2022-12-27 ENCOUNTER — Ambulatory Visit: Payer: Medicaid Other | Admitting: Physical Therapy

## 2022-12-27 DIAGNOSIS — M25652 Stiffness of left hip, not elsewhere classified: Secondary | ICD-10-CM

## 2022-12-27 DIAGNOSIS — R293 Abnormal posture: Secondary | ICD-10-CM

## 2022-12-27 DIAGNOSIS — M25552 Pain in left hip: Secondary | ICD-10-CM | POA: Diagnosis not present

## 2022-12-27 DIAGNOSIS — R262 Difficulty in walking, not elsewhere classified: Secondary | ICD-10-CM

## 2022-12-27 NOTE — Therapy (Signed)
OUTPATIENT PHYSICAL THERAPY TREATMENT NOTE / PROGRESS NOTE Dates of reporting from 12/13/2022 to 12/27/2022   Patient Name: Johnny Banks MRN: [id number] DOB:1961/01/22, 62 y.o., male Today's Date: 12/27/2022  PCP: Nolene Ebbs, MD REFERRING PROVIDER: Cari Caraway, PA   END OF SESSION:   PT End of Session - 12/27/22 2025     Visit Number 3    Number of Visits 24    Date for PT Re-Evaluation 03/07/23    Authorization Type MEDICAID [place] ACCESS reporting period from 12/13/2022    Authorization Time Period auth 2/5-2/16 for 3 PT visits    Authorization - Visit Number 2    Authorization - Number of Visits 3    Progress Note Due on Visit 10    PT Start Time 0950    PT Stop Time 1030    PT Time Calculation (min) 40 min    Activity Tolerance Patient tolerated treatment well    Behavior During Therapy [facility] for tasks assessed/performed              Past Medical History:  Diagnosis Date   Bipolar 1 disorder ([place])    Hypertension    TBI (traumatic brain injury) [facility])    Past Surgical History:  Procedure Laterality Date   APPENDECTOMY     ESOPHAGOGASTRODUODENOSCOPY N/A 12/20/2016   Procedure: ESOPHAGOGASTRODUODENOSCOPY (EGD);  Surgeon: Ronald Lobo, MD;  Location: [facility];  Service: Endoscopy;  Laterality: N/A;   HIP FRACTURE SURGERY     Patient Active Problem List   Diagnosis Date Noted   Bilateral shoulder pain 01/04/2017   Physical deconditioning    Symptomatic anemia    Tobacco abuse    Melena 12/19/2016   Acute blood loss anemia 12/19/2016   ETOH abuse 12/19/2016   UGIB (upper gastrointestinal bleed) 12/19/2016   Hypotension, unspecified 04/04/2013   Sepsis ([place]) 04/02/2013   Bipolar affective disorder ([place]) 04/02/2013   Polysubstance abuse ([place]) 04/02/2013   HTN (hypertension) 04/02/2013   Syncope 04/02/2013   Acute kidney injury ([place]) 04/02/2013    REFERRING DIAG: s/p total replacement of left hip   THERAPY DIAG:  Pain in left  hip  Stiffness of left hip, not elsewhere classified  Abnormal posture  Difficulty in walking, not elsewhere classified  Rationale for Evaluation and Treatment: Rehabilitation  PERTINENT HISTORY: Patient is a 62 y.o. male who presents to outpatient physical therapy with a referral for medical diagnosis s/p total replacement of left hip. This patient's chief complaints consist of left hip pain, weakness, and stiffness, difficulty weight bearing, stooped posture, difficulty with mobility S/P L THA posterior approach 09/17/2022 leading to the following functional deficits: difficulty with usual activities including household and community ambulation, transfers, and bed mobility, standing, stairs, squatting, mobility without falling, walking without walker, avoiding falls, being steady on feet, walking for exercise, walking downtown, standing up straight, unable to have shoulders replaced since he is dependent on UE weight bearing while walking, getting in and out of the car, dressing. Relevant past medical history and comorbidities include s/p R THA 06/14/2022, osteopenia in left hip, current smoker, smoker's cough, HTN, ETOH abuse, bipolar affective disorder, bipolar I disorder, polysubstance abuse, syncope, physical deconditioning, B shoulder pain (patient reports he needs both replaced), appendectomy, chronic stiffness in B hips, prior B hip pinning, B hip dysfunciton since childhood, low back pain, TBI. Patient denies hx of cancer, stroke, seizures, heart problems, diabetes, unexplained weight loss, unexplained changes in bowel or bladder problems, unexplained stumbling or dropping things, osteoporosis, and  spinal surgery.    PRECAUTIONS: Falls  SUBJECTIVE:                                                                                                                                                                                      SUBJECTIVE STATEMENT:  Patient states he took his w/c and walked all  the way to the store and back pushing his w/c. He states he felt okay after last PT session. He has been trying to practice his exercises but not a much as he thinks his PT wants him to. He states he has a lot of trouble standing upright due to back pain and fatigue. He states he has not been able to get a [facility] and he h continued to use his broken walker even though there is another at the house because he is so used to it.    PAIN:  Are you having pain? NPRS 4/10 left hip when attempting to put weight on L LE   OBJECTIVE  SELF-REPORTED FUNCTION FOTO score: 30/100 (hip questionnaire)  TODAY'S TREATMENT:    Therapeutic exercise: to centralize symptoms and improve ROM, strength, muscular endurance, and activity tolerance required for successful completion of functional activities.  - ambulation with SPC 3x200 feet with CGA (limited most by cardiorespiratory endurance).  - quadruped hip extension with knee flexed, 2x10 each side, SBA for safety.  - child's pose stretch as a break between quadruped hip exercises, 2x60 seconds.   Manual therapy: to reduce pain and tissue tension, improve range of motion, neuromodulation, in order to promote improved ability to complete functional activities. PRONE with half foam roll under left distal thigh to help stretch hip into extension:  - CPA along mid thoracic and lumbar spine, grade III-IV, 1x10-20 reps per segment.  - PA at left hip 3x30 seconds  HOOKLYING with L LE off table - PROM L hip flexor stretch, 3x30 seconds.   Pt required multimodal cuing for proper technique and to facilitate improved neuromuscular control, strength, range of motion, and functional ability resulting in improved performance and form.   PATIENT EDUCATION:  Education details: Exercise purpose/form. Self management techniques. Education on diagnosis, prognosis, POC, anatomy and physiology of current condition Education on HEP including handout  Reviewed cancelation/no-show  policy with patient; patient verbalized understanding (12/13/22). Person educated: Patient Education method: Explanation Education comprehension: verbalized understanding and needs further education   HOME EXERCISE PROGRAM: Access Code: [email or web address] URL: [email or web address] Date: 12/18/2022 Prepared by: Rosita Kea  Exercises - Sit to Stand Without Arm Support  - 1 x daily - 3 sets - 10 reps - Static Prone on Elbows  - 2 x daily -  1 sets - 5 reps - 30 seconds hold   ASSESSMENT:   CLINICAL IMPRESSION: Patient arrives using broken RW (L side panel moves freely) and reports he was unable to obtain a SPC. Patient has attended 3 physical therapy sessions since starting PT on 12/27/2022. He has excellent attendance and participation during visits and has struggled to perform his HEP as much as recommended by PT. Progress note being completed today due to expiration of insurance authorization time period tomorrow. Patient has not completed enough PT visits to expect significant change since starting PT and his progress towards goals reflects this. His condition is affected by years of sitting and being mostly confined to a w/c due to bilateral hip dysplasia with bent an dysfunctional hardware from prior surgery. He now has significantly improved rehab potential due to mechanical limitations in the joint articulations being surgically improved, but significant PT an time is medically necessary to regain soft tissue flexibility, strength, muscular endurance, balance, and stability to return to prior level of function without AD. Plan to continue with PT as outlined in original POC to continue addressing patient's impairments and functional limitations. Patient would benefit from continued management of limiting condition by skilled physical therapist to address remaining impairments and functional limitations to work towards stated goals and return to PLOF or maximal functional independence.     From Initial PT Eval 12/13/2022: Patient is a 62 y.o. male referred to outpatient physical therapy with a medical diagnosis of s/p total replacement of left hip who presents with signs and symptoms consistent with left hip flexion contracture, left hip weakness, generalized weakness and unsteadiness on feet keeping him from ambulating independently s/p L THA on 09/17/2022. 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 Patient had R THA performed in August 2023 but was still very limited in reaching upright standing posture due to pain and stiffness at left hip and lumbar spine stiffness. He now has much better potential for improvement following joint replacements in both hips. He is very weak from prolonged inability to stand or move in an upright posture and has remaining stiffness in L > R hip from prolonged joint dysfunction prior to surgeries. Patient appears to have excellent potential to return to ambulation with no AD but will require prolonged strengthening and rehab due to the severity of his current weakness in B hips (L>R) especially hip abduction that does not yet let him maintain SLS (MML 2/5 on left and 3+/5 on right). Patient presents with significant pain, ROM, muscle length, joint stiffness, spine posture, balance, proprioceptive, motor control, gait, muscle performance (strength/power/endurance), and activity tolerance impairments that are limiting ability to complete usual activities including household and community ambulation, transfers, and bed mobility, standing, stairs, squatting, mobility without falling, walking without walker, avoiding falls, being steady on feet, walking for exercise, walking downtown, standing up straight, unable to have shoulders replaced since he is dependent on UE weight bearing while walking, getting in and out of the car, dressing without difficulty. Patient will benefit from skilled physical therapy intervention to address  current body structure impairments and activity limitations to improve function and work towards goals set in current POC in order to return to prior level of function or maximal functional improvement.      OBJECTIVE IMPAIRMENTS: Abnormal gait, decreased activity tolerance, decreased balance, decreased coordination, decreased endurance, decreased knowledge of condition, decreased knowledge of use of DME, decreased mobility, difficulty walking, decreased ROM, decreased strength,  hypomobility, increased fascial restrictions, impaired perceived functional ability, increased muscle spasms, impaired flexibility, impaired UE functional use, improper body mechanics, postural dysfunction, obesity, and pain.    ACTIVITY LIMITATIONS: carrying, lifting, bending, standing, squatting, stairs, transfers, bed mobility, bathing, toileting, dressing, reach over head, locomotion level, and caring for others   PARTICIPATION LIMITATIONS: meal prep, cleaning, laundry, interpersonal relationship, shopping, community activity, yard work, and   difficulty with usual activities including household and community ambulation, transfers, and bed mobility, standing, stairs, squatting, mobility without falling, walking without walker, avoiding falls, being steady on feet, walking for exercise, walking downtown, standing up straight, unable to have shoulders replaced since he is dependent on UE weight bearing while walking, getting in and out of the car, dressing   PERSONAL FACTORS: Fitness, Past/current experiences, Time since onset of injury/illness/exacerbation, and 3+ comorbidities:   s/p R THA 06/14/2022, 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    REHAB POTENTIAL: Excellent   CLINICAL DECISION MAKING: Evolving/moderate complexity   EVALUATION COMPLEXITY: Moderate     GOALS: Goals reviewed with patient? No   SHORT TERM GOALS: Target date: 12/27/2022   Patient will be independent with initial home exercise program for self-management of symptoms. Baseline: Initial HEP provided at IE (12/13/22); participating some (12/27/2022);  Goal status: Partially met     LONG TERM GOALS: Target date: 03/07/2023   Patient will be independent with a long-term home exercise program for self-management of symptoms.  Baseline: Initial HEP provided at IE (12/13/22); participating some (12/27/2022);  Goal status: In-progress   2.  Patient will demonstrate improved FOTO to equal or greater than 49 by visit #13 to demonstrate improvement in overall condition and self-reported functional ability.  Baseline: 36 (12/13/22); 30 at visit #3 (12/27/2022);  Goal status: INITIAL   3.  Patient will demonstrate ability to ambulate equal or greater than 1200 feet on 6 Min Walk Test with [facility] or less restrictive assistive device to help him return to walking for health and exercise in his community.  Baseline: 600 feet with RW (12/13/22); able to ambulate 200 feet at a time with [facility] and SBA-CGA (12/27/2022);  Goal status: In-progress   4.  Patient will complete 5 Times Sit To Stand from 18.5 inch surface or lower in equal or less than 11 seconds with no UE support to demonstrate improved fall risk and functional strength for household and community mobility and self care.  Baseline: 19 seconds from 18.5 inch plinth with use of B UE on table, RW in front for safety.  (12/13/22); not tested (12/27/2022);  Goal status: In-progress   5.  Patient will complete community, work and/or recreational activities with 75% less limitation due to current condition.  Baseline: difficulty with usual activities  including household and community ambulation, transfers, and bed mobility, standing, stairs, squatting, mobility without falling, walking without walker, avoiding falls, being steady on feet, walking for exercise, walking downtown, standing up straight, unable to have shoulders replaced since he is dependent on UE weight bearing while walking, getting in and out of the car, dressing (12/13/22); peple have been commenting he is qucker getting up out of his chair ( 12/27/2022);  Goal status: In-progress   6.  Patient  will demonstrate the ability to perform single leg stance without trendelenburg for equal or greater than 10 seconds on each side to demonstrate improved hip strength for normal and functional gait pattern for household and community mobility without and AD with decreased risk of falls.  Baseline: > 1 second on R, unable on left due to pain and weakness and trendelenburg sign (12/13/2022; 12/27/2022); Goal status: In-progress     PLAN:   PT FREQUENCY: 1-2x/week   PT DURATION: 12 weeks   PLANNED INTERVENTIONS: Therapeutic exercises, Therapeutic activity, Neuromuscular re-education, Balance training, Gait training, Patient/Family education, Self Care, Joint mobilization, Stair training, DME instructions, Aquatic Therapy, Dry Needling, Electrical stimulation, Spinal mobilization, Cryotherapy, Moist heat, Manual therapy, and Re-evaluation   PLAN FOR NEXT SESSION: update HEP as appropriate, progressive LE/hip/functional strengthening with particular attention to hip abduction, interventions for improved ROM (especially hip and low back extension), balance and gait training as appropriate, manual therapy as needed.    Nancy Nordmann, PT, DPT 12/27/2022, 8:41 PM  [facility] [address] [place], [place] [address] P: [phone] I F: [phone]

## 2023-01-01 ENCOUNTER — Encounter: Payer: Self-pay | Admitting: Physical Therapy

## 2023-01-01 ENCOUNTER — Ambulatory Visit: Payer: Medicaid Other | Admitting: Physical Therapy

## 2023-01-01 DIAGNOSIS — M25652 Stiffness of left hip, not elsewhere classified: Secondary | ICD-10-CM

## 2023-01-01 DIAGNOSIS — R262 Difficulty in walking, not elsewhere classified: Secondary | ICD-10-CM

## 2023-01-01 DIAGNOSIS — M25552 Pain in left hip: Secondary | ICD-10-CM | POA: Diagnosis not present

## 2023-01-01 DIAGNOSIS — R293 Abnormal posture: Secondary | ICD-10-CM

## 2023-01-01 NOTE — Therapy (Signed)
OUTPATIENT PHYSICAL THERAPY TREATMENT NOTE    Patient Name: Johnny Banks MRN: [id number] DOB:Nov 29, 1960, 62 y.o., male Today's Date: 01/01/2023  PCP: Nolene Ebbs, MD REFERRING PROVIDER: Cari Caraway, PA   END OF SESSION:   PT End of Session - 01/01/23 0952     Visit Number 4    Number of Visits 24    Date for PT Re-Evaluation 03/07/23    Authorization Type MEDICAID [place] ACCESS reporting period from 12/13/2022    Authorization Time Period auth 12 visits until 02/11/2023    Authorization - Visit Number 1    Authorization - Number of Visits 12    Progress Note Due on Visit 10    PT Start Time 0951    PT Stop Time 1029    PT Time Calculation (min) 38 min    Activity Tolerance Patient tolerated treatment well    Behavior During Therapy [facility] for tasks assessed/performed              Past Medical History:  Diagnosis Date   Bipolar 1 disorder ([place])    Hypertension    TBI (traumatic brain injury) [facility])    Past Surgical History:  Procedure Laterality Date   APPENDECTOMY     ESOPHAGOGASTRODUODENOSCOPY N/A 12/20/2016   Procedure: ESOPHAGOGASTRODUODENOSCOPY (EGD);  Surgeon: Ronald Lobo, MD;  Location: [facility];  Service: Endoscopy;  Laterality: N/A;   HIP FRACTURE SURGERY     Patient Active Problem List   Diagnosis Date Noted   Bilateral shoulder pain 01/04/2017   Physical deconditioning    Symptomatic anemia    Tobacco abuse    Melena 12/19/2016   Acute blood loss anemia 12/19/2016   ETOH abuse 12/19/2016   UGIB (upper gastrointestinal bleed) 12/19/2016   Hypotension, unspecified 04/04/2013   Sepsis ([place]) 04/02/2013   Bipolar affective disorder ([place]) 04/02/2013   Polysubstance abuse ([place]) 04/02/2013   HTN (hypertension) 04/02/2013   Syncope 04/02/2013   Acute kidney injury ([place]) 04/02/2013    REFERRING DIAG: s/p total replacement of left hip   THERAPY DIAG:  Pain in left hip  Stiffness of left hip, not elsewhere classified  Abnormal  posture  Difficulty in walking, not elsewhere classified  Rationale for Evaluation and Treatment: Rehabilitation  PERTINENT HISTORY: Patient is a 62 y.o. male who presents to outpatient physical therapy with a referral for medical diagnosis s/p total replacement of left hip. This patient's chief complaints consist of left hip pain, weakness, and stiffness, difficulty weight bearing, stooped posture, difficulty with mobility S/P L THA posterior approach 09/17/2022 leading to the following functional deficits: difficulty with usual activities including household and community ambulation, transfers, and bed mobility, standing, stairs, squatting, mobility without falling, walking without walker, avoiding falls, being steady on feet, walking for exercise, walking downtown, standing up straight, unable to have shoulders replaced since he is dependent on UE weight bearing while walking, getting in and out of the car, dressing. Relevant past medical history and comorbidities include s/p R THA 06/14/2022, osteopenia in left hip, current smoker, smoker's cough, HTN, ETOH abuse, bipolar affective disorder, bipolar I disorder, polysubstance abuse, syncope, physical deconditioning, B shoulder pain (patient reports he needs both replaced), appendectomy, chronic stiffness in B hips, prior B hip pinning, B hip dysfunciton since childhood, low back pain, TBI. Patient denies hx of cancer, stroke, seizures, heart problems, diabetes, unexplained weight loss, unexplained changes in bowel or bladder problems, unexplained stumbling or dropping things, osteoporosis, and spinal surgery.    PRECAUTIONS: Falls  SUBJECTIVE:  SUBJECTIVE STATEMENT:  Patient reports others have noticed he is coming down stairs easier. He states he has pain when putting weight  on left LE. He arrives with his RW. He thinks he is seeing his orthopedic surgeon on March 5th. He has not been doing his HEP. He states he gets a lot of exercise just getting around. He reports the manual therapy to the low back last session helped his back pain for several days.    PAIN:  Are you having pain? NPRS 0/10   OBJECTIVE  TODAY'S TREATMENT:    Therapeutic exercise: to centralize symptoms and improve ROM, strength, muscular endurance, and activity tolerance required for successful completion of functional activities.  - ambulation with SPC 3x200 feet with CGA (limited most by low back pain). 32mn rest between bouts. Education provided about importance of getting a SPC and practicing HEP and walking with cane at home.  - quadruped to press up position, 1x10 - quadruped hip extension with knee flexed, 1x10 each side, SBA for safety.  - child's pose stretch as a break between quadruped hip exercises, 2x20-30 seconds.   Manual therapy: to reduce pain and tissue tension, improve range of motion, neuromodulation, in order to promote improved ability to complete functional activities. PRONE - CPA along mid thoracic and lumbar spine, grade III-IV, 1x30 seconds per segment.  - PA at left hip 3x30 second in neutral (belt under distal thigh), and 3x30 seconds in slight ER (modified figure 4 position ).   Pt required multimodal cuing for proper technique and to facilitate improved neuromuscular control, strength, range of motion, and functional ability resulting in improved performance and form.   PATIENT EDUCATION:  Education details: Exercise purpose/form. Self management techniques. Education on diagnosis, prognosis, POC, anatomy and physiology of current condition Education on HEP including handout  Reviewed cancelation/no-show policy with patient; patient verbalized understanding (12/13/22). Person educated: Patient Education method: Explanation Education comprehension: verbalized  understanding and needs further education   HOME EXERCISE PROGRAM: Access Code: [email or web address]: [email or web address] Date: 12/18/2022 Prepared by: SRosita Kea Exercises - Sit to Stand Without Arm Support  - 1 x daily - 3 sets - 10 reps - Static Prone on Elbows  - 2 x daily - 1 sets - 5 reps - 30 seconds hold   ASSESSMENT:   CLINICAL IMPRESSION: Patient arrives using broken RW (L side panel moves freely) and reporting poor participation in HEP. He also did not get a SPC yet. Patient continued to be educated about the importance of the specific rehab exercises and use of L LE to develop strength and ROM needed for function. He was strongly encouraged to get a SPC as he will likely not be able to transition safely from RW directly to no AD. Patient continued to participate in session fully. Continued with manual techniques and stretching for improved ROM, as well as strengthening to improve upright posture and hip strength for function. Patient would benefit from continued management of limiting condition by skilled physical therapist to address remaining impairments and functional limitations to work towards stated goals and return to PLOF or maximal functional independence.   From Initial PT Eval 12/13/2022: Patient is a 62y.o. male referred to outpatient physical therapy with a medical diagnosis of s/p total replacement of left hip who presents with signs and symptoms consistent with left hip flexion contracture, left hip weakness, generalized weakness and unsteadiness on feet keeping him from ambulating independently s/p L THA on 09/17/2022. 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 Patient had R THA performed in August 2023 but was still very limited in reaching upright standing posture due to pain and stiffness at left hip and lumbar spine stiffness. He now has much better potential for improvement following joint replacements in both hips.  He is very weak from prolonged inability to stand or move in an upright posture and has remaining stiffness in L > R hip from prolonged joint dysfunction prior to surgeries. Patient appears to have excellent potential to return to ambulation with no AD but will require prolonged strengthening and rehab due to the severity of his current weakness in B hips (L>R) especially hip abduction that does not yet let him maintain SLS (MML 2/5 on left and 3+/5 on right). Patient presents with significant pain, ROM, muscle length, joint stiffness, spine posture, balance, proprioceptive, motor control, gait, muscle performance (strength/power/endurance), and activity tolerance impairments that are limiting ability to complete usual activities including household and community ambulation, transfers, and bed mobility, standing, stairs, squatting, mobility without falling, walking without walker, avoiding falls, being steady on feet, walking for exercise, walking downtown, standing up straight, unable to have shoulders replaced since he is dependent on UE weight bearing while walking, getting in and out of the car, dressing without difficulty. Patient will benefit from skilled physical therapy intervention to address current body structure impairments and activity limitations to improve function and work towards goals set in current POC in order to return to prior level of function or maximal functional improvement.      OBJECTIVE IMPAIRMENTS: Abnormal gait, decreased activity tolerance, decreased balance, decreased coordination, decreased endurance, decreased knowledge of condition, decreased knowledge of use of DME, decreased mobility, difficulty walking, decreased ROM, decreased strength, hypomobility, increased fascial restrictions, impaired perceived functional ability, increased muscle spasms, impaired flexibility, impaired UE functional use, improper body mechanics, postural dysfunction, obesity, and pain.    ACTIVITY  LIMITATIONS: carrying, lifting, bending, standing, squatting, stairs, transfers, bed mobility, bathing, toileting, dressing, reach over head, locomotion level, and caring for others   PARTICIPATION LIMITATIONS: meal prep, cleaning, laundry, interpersonal relationship, shopping, community activity, yard work, and   difficulty with usual activities including household and community ambulation, transfers, and bed mobility, standing, stairs, squatting, mobility without falling, walking without walker, avoiding falls, being steady on feet, walking for exercise, walking downtown, standing up straight, unable to have shoulders replaced since he is dependent on UE weight bearing while walking, getting in and out of the car, dressing   PERSONAL FACTORS: Fitness, Past/current experiences, Time since onset of injury/illness/exacerbation, and 3+ comorbidities:   s/p R THA 06/14/2022, 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    REHAB POTENTIAL: Excellent   CLINICAL DECISION MAKING: Evolving/moderate complexity   EVALUATION COMPLEXITY: Moderate     GOALS: Goals reviewed with patient? No   SHORT TERM GOALS: Target date: 12/27/2022   Patient will be independent with initial home exercise program for self-management of symptoms. Baseline: Initial HEP provided at IE (12/13/22); participating some (12/27/2022);  Goal status: Partially met     LONG TERM GOALS: Target date: 03/07/2023   Patient will be independent with a long-term home exercise program for  self-management of symptoms.  Baseline: Initial HEP provided at IE (12/13/22); participating some  (12/27/2022);  Goal status: In-progress   2.  Patient will demonstrate improved FOTO to equal or greater than 49 by visit #13 to demonstrate improvement in overall condition and self-reported functional ability.  Baseline: 36 (12/13/22); 30 at visit #3 (12/27/2022);  Goal status: INITIAL   3.  Patient will demonstrate ability to ambulate equal or greater than 1200 feet on 6 Min Walk Test with [facility] or less restrictive assistive device to help him return to walking for health and exercise in his community.  Baseline: 600 feet with RW (12/13/22); able to ambulate 200 feet at a time with [facility] and SBA-CGA (12/27/2022);  Goal status: In-progress   4.  Patient will complete 5 Times Sit To Stand from 18.5 inch surface or lower in equal or less than 11 seconds with no UE support to demonstrate improved fall risk and functional strength for household and community mobility and self care.  Baseline: 19 seconds from 18.5 inch plinth with use of B UE on table, RW in front for safety.  (12/13/22); not tested (12/27/2022);  Goal status: In-progress   5.  Patient will complete community, work and/or recreational activities with 75% less limitation due to current condition.  Baseline: difficulty with usual activities including household and community ambulation, transfers, and bed mobility, standing, stairs, squatting, mobility without falling, walking without walker, avoiding falls, being steady on feet, walking for exercise, walking downtown, standing up straight, unable to have shoulders replaced since he is dependent on UE weight bearing while walking, getting in and out of the car, dressing (12/13/22); peple have been commenting he is qucker getting up out of his chair ( 12/27/2022);  Goal status: In-progress   6.  Patient will demonstrate the ability to perform single leg stance without trendelenburg for equal or greater than 10 seconds on each side to demonstrate improved hip strength for normal and functional  gait pattern for household and community mobility without and AD with decreased risk of falls.  Baseline: > 1 second on R, unable on left due to pain and weakness and trendelenburg sign (12/13/2022; 12/27/2022); Goal status: In-progress     PLAN:   PT FREQUENCY: 1-2x/week   PT DURATION: 12 weeks   PLANNED INTERVENTIONS: Therapeutic exercises, Therapeutic activity, Neuromuscular re-education, Balance training, Gait training, Patient/Family education, Self Care, Joint mobilization, Stair training, DME instructions, Aquatic Therapy, Dry Needling, Electrical stimulation, Spinal mobilization, Cryotherapy, Moist heat, Manual therapy, and Re-evaluation   PLAN FOR NEXT SESSION: update HEP as appropriate, progressive LE/hip/functional strengthening with particular attention to hip abduction, interventions for improved ROM (especially hip and low back extension), balance and gait training as appropriate, manual therapy as needed.    Nancy Nordmann, PT, DPT 01/01/2023, 1:20 PM  [facility] [address] [place], [place] [address] P: [phone] I F: [phone]

## 2023-01-02 ENCOUNTER — Ambulatory Visit (INDEPENDENT_AMBULATORY_CARE_PROVIDER_SITE_OTHER): Payer: Medicaid Other | Admitting: Dermatology

## 2023-01-02 DIAGNOSIS — L304 Erythema intertrigo: Secondary | ICD-10-CM | POA: Diagnosis not present

## 2023-01-02 DIAGNOSIS — L729 Follicular cyst of the skin and subcutaneous tissue, unspecified: Secondary | ICD-10-CM

## 2023-01-02 DIAGNOSIS — Z79899 Other long term (current) drug therapy: Secondary | ICD-10-CM | POA: Diagnosis not present

## 2023-01-02 NOTE — Progress Notes (Signed)
   Follow-Up Visit   Subjective  Johnny Banks is a 62 y.o. male who presents for the following: Follow-up (Intertrigo follow up of groin area - Elidel cream qd, Ketoconazole cream qd - comes and goes - he is still sitting a lot during the day due to some pain in his left hip). The patient has spots, moles and lesions to be evaluated, some may be new or changing and the patient has concerns that these could be cancer.  The following portions of the chart were reviewed this encounter and updated as appropriate:   Tobacco  Allergies  Meds  Problems  Med Hx  Surg Hx  Fam Hx     Review of Systems:  No other skin or systemic complaints except as noted in HPI or Assessment and Plan.  Objective  Well appearing patient in no apparent distress; mood and affect are within normal limits.  A focused examination was performed including groin area. Relevant physical exam findings are noted in the Assessment and Plan.  Groin area Erythema of groin andmedial thighs. Lichenification of scrotum.  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  It is exacerbated by increased movement / activity; sweating; and higher atmospheric temperature.  Continue Elidel cream qd, Ketoconazole 2% cream qd - he will call with name of new pharmacy Start CLN wash daily - samples given  Related Medications pimecrolimus (ELIDEL) 1 % cream Apply to affected skin in the morning ketoconazole (NIZORAL) 2 % cream Apply to affected skin at bedtime  Scrotal cysts - multiple Scrotum Discussed excision. Patient may consider  Return for Surgery - cyst.  I, Ashok Cordia, CMA, am acting as scribe for Sarina Ser, MD . Documentation: I have reviewed the above documentation for accuracy and completeness, and I agree  with the above.  Sarina Ser, MD

## 2023-01-02 NOTE — Patient Instructions (Addendum)
PRE-OPERATIVE INSTRUCTIONS  We recommend you read the following instructions. If you have any questions or concerns, please call the office at [phone].  Shower and wash the entire body with soap and water the day of your surgery paying special attention to cleansing at and around the planned surgery site. Avoid aspirin or aspirin containing products at least ten (10) days prior to your surgical procedure and for at least one week (7 days) after your surgical procedure. If you take aspirin on a regular basis for heart disease or history of stroke or for any other reason, we may recommend you continue taking aspirin but please notify us if you take this on a regular basis. Aspirin can cause more bleeding to occur during surgery as well as prolonged bleeding and bruising after surgery. Avoid other nonsteroidal pain medications at least one week prior to surgery and at least one week after your surgery. These include medications such as Ibuprofen (Motrin, Advil and Nuprin), Naprosyn, Voltaren, Relafen, etc. If these medications are used for therapeutic reasons, please inform us as they can cause increased bleeding or prolonged bleeding during and bruising after surgical procedures.  Please advise [place] if you are taking any "blood thinner" medications such as Coumadin or Dipyridamole or Plavix or similar medications. These cause increased bleeding and prolonged bleeding during procedures and bruising after surgical procedures. We may have to consider discontinuing these medications briefly prior to and shortly after your surgery if safe to do so. Please inform us of all medications you are currently taking. All medications that are taken regularly should be taken the day of surgery as you always do. Nevertheless, we need to be informed of what medications you are taking prior to surgery to know whether they will affect the procedure or cause any complications. Please inform us of any medication allergies.  Also inform us of whether you have allergies to Latex or rubber products or whether you have had any adverse reaction to Lidocaine or Epinephrine. Please inform us of any prosthetic or artificial body parts such as artificial heart valve, joint replacements, etc., or similar condition that might require preoperative antibiotics. We recommend avoidance of alcohol at least two weeks prior to surgery and continued avoidance for at least two weeks after surgery. We recommend avoidance of tobacco smoking at least two weeks prior to surgery and continued abstinence for at least two weeks after surgery. Do not plan strenuous exercise, strenuous work or strenuous lifting for approximately four weeks after your surgery. We request if you are unable to make your scheduled surgical appointment, please call us at least a week in advance or as soon as you are aware of a problem so that we can cancel or reschedule the appointment. You MAY TAKE TYLENOL (acetaminophen) for pain as it is not a blood thinner. PLEASE PLAN TO BE IN TOWN FOR TWO WEEKS FOLLOWING SURGERY. THIS IS IMPORTANT SO YOU CAN BE CHECKED FOR DRESSING CHANGES, SUTURE REMOVAL AND TO MONITOR FOR POSSIBLE COMPLICATIONS.   Due to recent changes in healthcare laws, you may see results of your pathology and/or laboratory studies on MyChart before the doctors have had a chance to review them. We understand that in some cases there may be results that are confusing or concerning to you. Please understand that not all results are received at the same time and often the doctors may need to interpret multiple results in order to provide you with the best plan of care or course of treatment. Therefore, we ask  that you please give [place] 2 business days to thoroughly review all your results before contacting the office for clarification. Should we see a critical lab result, you will be contacted sooner.   If You Need Anything After Your Visit  If you have any  questions or concerns for your doctor, please call our main line at [phone] and press option 4 to reach your doctor's medical assistant. If no one answers, please leave a voicemail as directed and we will return your call as soon as possible. Messages left after 4 pm will be answered the following business day.   You may also send [place] a message via [place]. We typically respond to MyChart messages within 1-2 business days.  For prescription refills, please ask your pharmacy to contact our office. Our fax number is [phone].  If you have an urgent issue when the clinic is closed that cannot wait until the next business day, you can page your doctor at the number below.    Please note that while we do our best to be available for urgent issues outside of office hours, we are not available 24/7.   If you have an urgent issue and are unable to reach [place], you may choose to seek medical care at your doctor's office, retail clinic, urgent care center, or emergency room.  If you have a medical emergency, please immediately call 911 or go to the emergency department.  Pager Numbers  - Dr. Nehemiah Massed: [phone]  - Dr. Laurence Ferrari: [phone]  - Dr. Nicole Kindred: [phone]  In the event of inclement weather, please call our main line at [phone] for an update on the status of any delays or closures.  Dermatology Medication Tips: Please keep the boxes that topical medications come in in order to help keep track of the instructions about where and how to use these. Pharmacies typically print the medication instructions only on the boxes and not directly on the medication tubes.   If your medication is too expensive, please contact our office at [phone] option 4 or send [place] a message through [place].   We are unable to tell what your co-pay for medications will be in advance as this is different depending on your insurance coverage. However, we may be able to find a substitute medication at  lower cost or fill out paperwork to get insurance to cover a needed medication.   If a prior authorization is required to get your medication covered by your insurance company, please allow [place] 1-2 business days to complete this process.  Drug prices often vary depending on where the prescription is filled and some pharmacies may offer cheaper prices.  The website www.goodrx.com contains coupons for medications through different pharmacies. The prices here do not account for what the cost may be with help from insurance (it may be cheaper with your insurance), but the website can give you the price if you did not use any insurance.  - You can print the associated coupon and take it with your prescription to the pharmacy.  - You may also stop by our office during regular business hours and pick up a GoodRx coupon card.  - If you need your prescription sent electronically to a different pharmacy, notify our office through [facility] or by phone at [phone] option 4.     Si Usted Necesita Algo Despus de Su Visita  Tambin puede enviarnos un mensaje a travs de Pharmacist, community. Por lo general respondemos a los Counsellor  transcurso de 1 a 2 das hbiles.  Para renovar recetas, por favor pida a su farmacia que se ponga en contacto con nuestra oficina. Harland Dingwall de fax es [place] [phone].  Si tiene un asunto urgente cuando la clnica est cerrada y que no puede esperar hasta el siguiente da hbil, puede llamar/localizar a su doctor(a) al nmero que aparece a continuacin.   Por favor, tenga en cuenta que aunque hacemos todo lo posible para estar disponibles para asuntos urgentes fuera del horario de [place], no estamos disponibles las 24 horas del da, los 7 das de la [place].   Si tiene un problema urgente y no puede comunicarse con nosotros, puede optar por buscar atencin mdica  en el consultorio de su doctor(a), en una clnica privada, en un centro de atencin urgente  o en una sala de emergencias.  Si tiene Engineering geologist, por favor llame inmediatamente al 911 o vaya a la sala de emergencias.  Nmeros de bper  - Dr. Nehemiah Massed: [phone]  - Dra. Moye: [phone]  - Dra. Nicole Kindred: [phone]  En caso de inclemencias del [place], por favor llame a Johnsie Kindred principal al [phone] para una actualizacin sobre el [place] de cualquier retraso o cierre.  Consejos para la medicacin en dermatologa: Por favor, guarde las cajas en las que vienen los medicamentos de uso tpico para ayudarle a seguir las instrucciones sobre dnde y cmo usarlos. Las farmacias generalmente imprimen las instrucciones del medicamento slo en las cajas y no directamente en los tubos del [place].   Si su medicamento es muy caro, por favor, pngase en contacto con Zigmund Daniel llamando al [phone] y presione la opcin 4 o envenos un mensaje a travs de Pharmacist, community.   No podemos decirle cul ser su copago por los medicamentos por adelantado ya que esto es diferente dependiendo de la cobertura de su seguro. Sin embargo, es posible que podamos encontrar un medicamento sustituto a Electrical engineer un formulario para que el seguro cubra el medicamento que se considera necesario.   Si se requiere una autorizacin previa para que su compaa de seguros [place] su medicamento, por favor permtanos de 1 a 2 das hbiles para completar este proceso.  Los precios de los medicamentos varan con frecuencia dependiendo del Environmental consultant de dnde se surte la receta y alguna farmacias pueden ofrecer precios ms baratos.  El sitio web www.goodrx.com tiene cupones para medicamentos de Airline pilot. Los precios aqu no tienen en cuenta lo que podra costar con la ayuda del seguro (puede ser ms barato con su seguro), pero el sitio web puede darle el precio si no utiliz Research scientist (physical sciences).  - Puede imprimir el cupn correspondiente y llevarlo con su receta a la farmacia.  - Tambin  puede pasar por nuestra oficina durante el horario de atencin regular y Charity fundraiser una tarjeta de cupones de GoodRx.  - Si necesita que su receta se enve electrnicamente a una farmacia diferente, informe a nuestra oficina a travs de MyChart de [place] o por telfono llamando al [phone] y presione la opcin 4.

## 2023-01-03 ENCOUNTER — Ambulatory Visit: Payer: Medicaid Other | Admitting: Physical Therapy

## 2023-01-08 ENCOUNTER — Encounter: Payer: Self-pay | Admitting: Dermatology

## 2023-01-08 ENCOUNTER — Ambulatory Visit: Payer: Medicaid Other | Admitting: Physical Therapy

## 2023-01-10 ENCOUNTER — Ambulatory Visit: Payer: Medicaid Other | Admitting: Physical Therapy

## 2023-01-17 ENCOUNTER — Encounter: Payer: Medicaid Other | Admitting: Physical Therapy

## 2023-01-22 ENCOUNTER — Encounter: Payer: Medicaid Other | Admitting: Dermatology

## 2023-01-30 ENCOUNTER — Other Ambulatory Visit: Payer: Self-pay | Admitting: Internal Medicine

## 2023-01-30 ENCOUNTER — Encounter: Payer: Medicaid Other | Admitting: Physical Therapy

## 2023-01-31 LAB — COMPLETE METABOLIC PANEL WITH GFR
AG Ratio: 1.4 (calc) (ref 1.0–2.5)
ALT: 10 U/L (ref 9–46)
AST: 12 U/L (ref 10–35)
Albumin: 4.2 g/dL (ref 3.6–5.1)
Alkaline phosphatase (APISO): 66 U/L (ref 35–144)
BUN: 14 mg/dL (ref 7–25)
CO2: 26 mmol/L (ref 20–32)
Calcium: 9.4 mg/dL (ref 8.6–10.3)
Chloride: 103 mmol/L (ref 98–110)
Creat: 0.8 mg/dL (ref 0.70–1.35)
Globulin: 3.1 g/dL (calc) (ref 1.9–3.7)
Glucose, Bld: 78 mg/dL (ref 65–99)
Potassium: 4.7 mmol/L (ref 3.5–5.3)
Sodium: 139 mmol/L (ref 135–146)
Total Bilirubin: 0.5 mg/dL (ref 0.2–1.2)
Total Protein: 7.3 g/dL (ref 6.1–8.1)
eGFR: 99 mL/min/{1.73_m2} (ref >=60)

## 2023-01-31 LAB — LIPID PANEL
Cholesterol: 185 mg/dL (ref <200)
HDL: 46 mg/dL (ref >=40)
LDL Cholesterol (Calc): 107 mg/dL (calc) — ABNORMAL HIGH
Non-HDL Cholesterol (Calc): 139 mg/dL (calc) — ABNORMAL HIGH (ref <130)
Total CHOL/HDL Ratio: 4 (calc) (ref <5.0)
Triglycerides: 207 mg/dL — ABNORMAL HIGH (ref <150)

## 2023-01-31 LAB — CBC
HCT: 38 % — ABNORMAL LOW (ref 38.5–50.0)
Hemoglobin: 12.5 g/dL — ABNORMAL LOW (ref 13.2–17.1)
MCH: 28.7 pg (ref 27.0–33.0)
MCHC: 32.9 g/dL (ref 32.0–36.0)
MCV: 87.2 fL (ref 80.0–100.0)
MPV: 9.8 fL (ref 7.5–12.5)
Platelets: 253 10*3/uL (ref 140–400)
RBC: 4.36 10*6/uL (ref 4.20–5.80)
RDW: 15.6 % — ABNORMAL HIGH (ref 11.0–15.0)
WBC: 5.8 10*3/uL (ref 3.8–10.8)

## 2023-01-31 LAB — VITAMIN D 25 HYDROXY (VIT D DEFICIENCY, FRACTURES): Vit D, 25-Hydroxy: 25 ng/mL — ABNORMAL LOW (ref 30–100)

## 2023-01-31 LAB — PSA: PSA: 2.32 ng/mL (ref <=4.00)

## 2023-01-31 LAB — TSH: TSH: 0.91 mIU/L (ref 0.40–4.50)

## 2023-02-05 ENCOUNTER — Ambulatory Visit: Payer: Medicaid Other | Admitting: Physical Therapy

## 2023-02-05 ENCOUNTER — Ambulatory Visit: Payer: Medicaid Other | Attending: Physician Assistant | Admitting: Physical Therapy

## 2023-02-05 DIAGNOSIS — M25552 Pain in left hip: Secondary | ICD-10-CM | POA: Insufficient documentation

## 2023-02-05 DIAGNOSIS — R262 Difficulty in walking, not elsewhere classified: Secondary | ICD-10-CM | POA: Insufficient documentation

## 2023-02-05 DIAGNOSIS — R293 Abnormal posture: Secondary | ICD-10-CM | POA: Diagnosis present

## 2023-02-05 DIAGNOSIS — M25652 Stiffness of left hip, not elsewhere classified: Secondary | ICD-10-CM | POA: Diagnosis present

## 2023-02-05 NOTE — Therapy (Signed)
OUTPATIENT PHYSICAL THERAPY TREATMENT NOTE / PROGRESS NOTE Dates of reporting from 12/13/2022 to 02/05/2023   Patient Name: Johnny Banks MRN: [id number] DOB:Mar 23, 1961, 62 y.o., male Today's Date: 02/05/2023  PCP: Nolene Ebbs, MD REFERRING PROVIDER: Cari Caraway, PA   END OF SESSION:   PT End of Session - 02/05/23 0951     Visit Number 5    Number of Visits 24    Date for PT Re-Evaluation 03/07/23    Authorization Type MEDICAID [place] ACCESS reporting period from 12/13/2022    Authorization Time Period auth 12 visits until 02/11/2023    Authorization - Visit Number 2    Authorization - Number of Visits 12    Progress Note Due on Visit 10    PT Start Time 0950    PT Stop Time 1028    PT Time Calculation (min) 38 min    Activity Tolerance Patient tolerated treatment well    Behavior During Therapy [facility] for tasks assessed/performed              Past Medical History:  Diagnosis Date   Bipolar 1 disorder ([place])    Hypertension    TBI (traumatic brain injury) [facility])    Past Surgical History:  Procedure Laterality Date   APPENDECTOMY     ESOPHAGOGASTRODUODENOSCOPY N/A 12/20/2016   Procedure: ESOPHAGOGASTRODUODENOSCOPY (EGD);  Surgeon: Ronald Lobo, MD;  Location: [facility];  Service: Endoscopy;  Laterality: N/A;   HIP FRACTURE SURGERY     Patient Active Problem List   Diagnosis Date Noted   Bilateral shoulder pain 01/04/2017   Physical deconditioning    Symptomatic anemia    Tobacco abuse    Melena 12/19/2016   Acute blood loss anemia 12/19/2016   ETOH abuse 12/19/2016   UGIB (upper gastrointestinal bleed) 12/19/2016   Hypotension, unspecified 04/04/2013   Sepsis ([place]) 04/02/2013   Bipolar affective disorder ([place]) 04/02/2013   Polysubstance abuse ([place]) 04/02/2013   HTN (hypertension) 04/02/2013   Syncope 04/02/2013   Acute kidney injury ([place]) 04/02/2013    REFERRING DIAG: s/p total replacement of left hip   THERAPY DIAG:  Pain in left  hip  Stiffness of left hip, not elsewhere classified  Abnormal posture  Difficulty in walking, not elsewhere classified  Rationale for Evaluation and Treatment: Rehabilitation  PERTINENT HISTORY: Patient is a 62 y.o. male who presents to outpatient physical therapy with a referral for medical diagnosis s/p total replacement of left hip. This patient's chief complaints consist of left hip pain, weakness, and stiffness, difficulty weight bearing, stooped posture, difficulty with mobility S/P L THA posterior approach 09/17/2022 leading to the following functional deficits: difficulty with usual activities including household and community ambulation, transfers, and bed mobility, standing, stairs, squatting, mobility without falling, walking without walker, avoiding falls, being steady on feet, walking for exercise, walking downtown, standing up straight, unable to have shoulders replaced since he is dependent on UE weight bearing while walking, getting in and out of the car, dressing. Relevant past medical history and comorbidities include s/p R THA 06/14/2022, osteopenia in left hip, current smoker, smoker's cough, HTN, ETOH abuse, bipolar affective disorder, bipolar I disorder, polysubstance abuse, syncope, physical deconditioning, B shoulder pain (patient reports he needs both replaced), appendectomy, chronic stiffness in B hips, prior B hip pinning, B hip dysfunciton since childhood, low back pain, TBI. Patient denies hx of cancer, stroke, seizures, heart problems, diabetes, unexplained weight loss, unexplained changes in bowel or bladder problems, unexplained stumbling or dropping things, osteoporosis, and spinal  surgery.    PRECAUTIONS: Falls  SUBJECTIVE:                                                                                                                                                                                      SUBJECTIVE STATEMENT:  Patient reports he has good and bad days. He  states the last two days have been bad because the world is falling apart. He states he has not been to PT in a while because of issues with transportation and illness. He states he has a follow up with his surgeon in April and he wants to ask him if he needs to replace his hip again. He states it is still sticking out and hurting. He has been walking in the stores pushing his w/c until he gets out of breath and then his friend pushes him. He has not gotten a SPC yet but hopes to get one when he gets his check at the beginning of the month.  He has not been doing as much HEP as he thinks he needs to.    PAIN:  Are you having pain? NPRS 3-4/10 lateral left hip at    OBJECTIVE   SELF-REPORTED FUNCTION FOTO score: 36/100 (hip questionnaire)  FUNCTIONAL/BALANCE 6 Minute Walk Test: 434 feet with SPC in R UE, 2 standing rest breaks.   5 Times Sit To Stand Test: 22 seconds with no UE use.   L Single Leg Stance: < 1 second/unable.    TODAY'S TREATMENT:    Therapeutic exercise: to centralize symptoms and improve ROM, strength, muscular endurance, and activity tolerance required for successful completion of functional activities.  - 6 minute walk test to assess progress (see above) - 5 Time Sit To Stand test to assess progress (see above).  - Standing R foot tap and down to 8 inch step with R UE on SPC. 3x10 (SBA).  Pt required multimodal cuing for proper technique and to facilitate improved neuromuscular control, strength, range of motion, and functional ability resulting in improved performance and form.   PATIENT EDUCATION:  Education details: Exercise purpose/form. Self management techniques. Education on diagnosis, prognosis, POC, anatomy and physiology of current condition Education on HEP including handout  Reviewed cancelation/no-show policy with patient; patient verbalized understanding (12/13/22). Person educated: Patient Education method: Explanation Education comprehension:  verbalized understanding and needs further education   HOME EXERCISE PROGRAM: Access Code: [email or web address] URL: [email or web address] Date: 12/18/2022 Prepared by: Rosita Kea  Exercises - Sit to Stand Without Arm Support  - 1 x daily - 3 sets - 10 reps - Static Prone on Elbows  - 2 x daily - 1 sets - 5 reps -  30 seconds hold   ASSESSMENT:   CLINICAL IMPRESSION: Patient has attended 5 physical therapy sessions since starting current episode of care on 12/13/2022. He is returning to PT after his last visit 5 weeks ago and has been absent due to difficulty with transportation and illness. He has struggled to keep up with his HEP during that time. Patient demonstrates significant improvement in walking distance during 6 min walk test and needed less UE support in 5 Times Sit to Stand Test. He also reports mild improvemnets in functional mobility and activities. However, patient continues to be very limited due to impairments related to L hip weakness, pain, and overall deconditioning. He has not yet been able to obtain a SPC related to financial limitations. He is strongly recommended to get a SPC to allow him to build strength in the L hip and LE.  Patient would benefit from continued management of limiting condition by skilled physical therapist to address remaining impairments and functional limitations to work towards stated goals and return to PLOF or maximal functional independence.   From Initial PT Eval 12/13/2022: Patient is a 62 y.o. male referred to outpatient physical therapy with a medical diagnosis of s/p total replacement of left hip who presents with signs and symptoms consistent with left hip flexion contracture, left hip weakness, generalized weakness and unsteadiness on feet keeping him from ambulating independently s/p L THA on 09/17/2022. 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 Patient had R THA performed in August 2023 but was still  very limited in reaching upright standing posture due to pain and stiffness at left hip and lumbar spine stiffness. He now has much better potential for improvement following joint replacements in both hips. He is very weak from prolonged inability to stand or move in an upright posture and has remaining stiffness in L > R hip from prolonged joint dysfunction prior to surgeries. Patient appears to have excellent potential to return to ambulation with no AD but will require prolonged strengthening and rehab due to the severity of his current weakness in B hips (L>R) especially hip abduction that does not yet let him maintain SLS (MML 2/5 on left and 3+/5 on right). Patient presents with significant pain, ROM, muscle length, joint stiffness, spine posture, balance, proprioceptive, motor control, gait, muscle performance (strength/power/endurance), and activity tolerance impairments that are limiting ability to complete usual activities including household and community ambulation, transfers, and bed mobility, standing, stairs, squatting, mobility without falling, walking without walker, avoiding falls, being steady on feet, walking for exercise, walking downtown, standing up straight, unable to have shoulders replaced since he is dependent on UE weight bearing while walking, getting in and out of the car, dressing without difficulty. Patient will benefit from skilled physical therapy intervention to address current body structure impairments and activity limitations to improve function and work towards goals set in current POC in order to return to prior level of function or maximal functional improvement.      OBJECTIVE IMPAIRMENTS: Abnormal gait, decreased activity tolerance, decreased balance, decreased coordination, decreased endurance, decreased knowledge of condition, decreased knowledge of use of DME, decreased mobility, difficulty walking, decreased ROM, decreased strength, hypomobility, increased fascial  restrictions, impaired perceived functional ability, increased muscle spasms, impaired flexibility, impaired UE functional use, improper body mechanics, postural dysfunction, obesity, and pain.    ACTIVITY LIMITATIONS: carrying, lifting, bending, standing, squatting, stairs, transfers, bed mobility, bathing, toileting, dressing, reach over head, locomotion level, and  caring for others   PARTICIPATION LIMITATIONS: meal prep, cleaning, laundry, interpersonal relationship, shopping, community activity, yard work, and   difficulty with usual activities including household and community ambulation, transfers, and bed mobility, standing, stairs, squatting, mobility without falling, walking without walker, avoiding falls, being steady on feet, walking for exercise, walking downtown, standing up straight, unable to have shoulders replaced since he is dependent on UE weight bearing while walking, getting in and out of the car, dressing   PERSONAL FACTORS: Fitness, Past/current experiences, Time since onset of injury/illness/exacerbation, and 3+ comorbidities:   s/p R THA 06/14/2022, 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    REHAB POTENTIAL: Excellent   CLINICAL DECISION MAKING: Evolving/moderate complexity   EVALUATION COMPLEXITY: Moderate     GOALS: Goals reviewed with patient? No   SHORT TERM GOALS: Target date: 12/27/2022   Patient will be independent with initial home exercise program for self-management of symptoms. Baseline: Initial HEP provided at IE (12/13/22); participating some (12/27/2022);  Goal status: Partially met     LONG TERM GOALS:  Target date: 03/07/2023   Patient will be independent with a long-term home exercise program for self-management of symptoms.  Baseline: Initial HEP provided at IE (12/13/22); participating some (12/27/2022);  Goal status: In-progress   2.  Patient will demonstrate improved FOTO to equal or greater than 49 by visit #13 to demonstrate improvement in overall condition and self-reported functional ability.  Baseline: 36 (12/13/22); 30 at visit #3 (12/27/2022); 36 at visit #5 (02/05/2023); Goal status: In-progress   3.  Patient will demonstrate ability to ambulate equal or greater than 1200 feet on 6 Min Walk Test with [facility] or less restrictive assistive device to help him return to walking for health and exercise in his community.  Baseline: 600 feet with RW (12/13/22); able to ambulate 200 feet at a time with [facility] and SBA-CGA (12/27/2022); 434 feet with SPC and SBA (02/05/2023);  Goal status: In-progress   4.  Patient will complete 5 Times Sit To Stand from 18.5 inch surface or lower in equal or less than 11 seconds with no UE support to demonstrate improved fall risk and functional strength for household and community mobility and self care.  Baseline: 19 seconds from 18.5 inch plinth with use of B UE on table, RW in front for safety.  (12/13/22); not tested (12/27/2022); 22 seconds with no UE use (02/05/23);  Goal status: In-progress   5.  Patient will complete community, work and/or recreational activities with 75% less limitation due to current condition.  Baseline: difficulty with usual activities including household and community ambulation, transfers, and bed mobility, standing, stairs, squatting, mobility without falling, walking without walker, avoiding falls, being steady on feet, walking for exercise, walking downtown, standing up straight, unable to have shoulders replaced since he is dependent on UE weight bearing while walking, getting in and out of the car, dressing (12/13/22); peple have been  commenting he is qucker getting up out of his chair ( 12/27/2022); improving but still far from his goal and still very impaired (02/05/2023);  Goal status: In-progress   6.  Patient will demonstrate the ability to perform single leg stance without trendelenburg for equal or greater than 10 seconds  on each side to demonstrate improved hip strength for normal and functional gait pattern for household and community mobility without and AD with decreased risk of falls.  Baseline: > 1 second on R, unable on left due to pain and weakness and trendelenburg sign (12/13/2022; 12/27/2022); < 1 second/unable (02/05/2023);  Goal status: In-progress     PLAN:   PT FREQUENCY: 1-2x/week   PT DURATION: 12 weeks   PLANNED INTERVENTIONS: Therapeutic exercises, Therapeutic activity, Neuromuscular re-education, Balance training, Gait training, Patient/Family education, Self Care, Joint mobilization, Stair training, DME instructions, Aquatic Therapy, Dry Needling, Electrical stimulation, Spinal mobilization, Cryotherapy, Moist heat, Manual therapy, and Re-evaluation   PLAN FOR NEXT SESSION: update HEP as appropriate, progressive LE/hip/functional strengthening with particular attention to hip abduction, interventions for improved ROM (especially hip and low back extension), balance and gait training as appropriate, manual therapy as needed.    Nancy Nordmann, PT, DPT 02/05/2023, 10:29 AM  [facility] [address] [place], [place] [address] P: [phone] I F: [phone]

## 2023-02-11 ENCOUNTER — Ambulatory Visit: Payer: Medicaid Other | Admitting: Physical Therapy

## 2023-02-13 ENCOUNTER — Ambulatory Visit: Payer: Medicaid Other | Admitting: Physical Therapy

## 2023-02-19 ENCOUNTER — Encounter: Payer: Medicaid Other | Admitting: Dermatology

## 2023-02-20 ENCOUNTER — Encounter: Payer: Self-pay | Admitting: Physical Therapy

## 2023-02-20 ENCOUNTER — Ambulatory Visit: Payer: Medicaid Other | Attending: Physician Assistant | Admitting: Physical Therapy

## 2023-02-20 DIAGNOSIS — M25552 Pain in left hip: Secondary | ICD-10-CM

## 2023-02-20 DIAGNOSIS — M25652 Stiffness of left hip, not elsewhere classified: Secondary | ICD-10-CM | POA: Diagnosis present

## 2023-02-20 DIAGNOSIS — R262 Difficulty in walking, not elsewhere classified: Secondary | ICD-10-CM | POA: Diagnosis present

## 2023-02-20 DIAGNOSIS — R293 Abnormal posture: Secondary | ICD-10-CM | POA: Diagnosis present

## 2023-02-20 NOTE — Therapy (Signed)
OUTPATIENT PHYSICAL THERAPY TREATMENT NOTE    Patient Name: Johnny Banks MRN: [id number] DOB:10/03/1961, 62 y.o., male Today's Date: 02/20/2023  PCP: Fleet Contras, MD REFERRING PROVIDER: Sherley Bounds, PA   END OF SESSION:   PT End of Session - 02/20/23 0951     Visit Number 6    Number of Visits 24    Date for PT Re-Evaluation 03/07/23    Authorization Type MEDICAID [place] ACCESS reporting period from 12/13/2022    Authorization Time Period [employer] auth 4/3-6/25 for 12 PT visits    Authorization - Visit Number 1    Authorization - Number of Visits 12    Progress Note Due on Visit 10    PT Start Time 0950    PT Stop Time 1028    PT Time Calculation (min) 38 min    Activity Tolerance Patient tolerated treatment well    Behavior During Therapy [facility] for tasks assessed/performed               Past Medical History:  Diagnosis Date   Bipolar 1 disorder    Hypertension    TBI (traumatic brain injury)    Past Surgical History:  Procedure Laterality Date   APPENDECTOMY     ESOPHAGOGASTRODUODENOSCOPY N/A 12/20/2016   Procedure: ESOPHAGOGASTRODUODENOSCOPY (EGD);  Surgeon: Bernette Redbird, MD;  Location: [facility];  Service: Endoscopy;  Laterality: N/A;   HIP FRACTURE SURGERY     Patient Active Problem List   Diagnosis Date Noted   Bilateral shoulder pain 01/04/2017   Physical deconditioning    Symptomatic anemia    Tobacco abuse    Melena 12/19/2016   Acute blood loss anemia 12/19/2016   ETOH abuse 12/19/2016   UGIB (upper gastrointestinal bleed) 12/19/2016   Hypotension, unspecified 04/04/2013   Sepsis 04/02/2013   Bipolar affective disorder (HCC) 04/02/2013   Polysubstance abuse 04/02/2013   HTN (hypertension) 04/02/2013   Syncope 04/02/2013   Acute kidney injury 04/02/2013    REFERRING DIAG: s/p total replacement of left hip   THERAPY DIAG:  Pain in left hip  Stiffness of left hip, not elsewhere classified  Abnormal posture  Difficulty in  walking, not elsewhere classified  Rationale for Evaluation and Treatment: Rehabilitation  PERTINENT HISTORY: Patient is a 62 y.o. male who presents to outpatient physical therapy with a referral for medical diagnosis s/p total replacement of left hip. This patient's chief complaints consist of left hip pain, weakness, and stiffness, difficulty weight bearing, stooped posture, difficulty with mobility S/P L THA posterior approach 09/17/2022 leading to the following functional deficits: difficulty with usual activities including household and community ambulation, transfers, and bed mobility, standing, stairs, squatting, mobility without falling, walking without walker, avoiding falls, being steady on feet, walking for exercise, walking downtown, standing up straight, unable to have shoulders replaced since he is dependent on UE weight bearing while walking, getting in and out of the car, dressing. Relevant past medical history and comorbidities include s/p R THA 06/14/2022, osteopenia in left hip, current smoker, smoker's cough, HTN, ETOH abuse, bipolar affective disorder, bipolar I disorder, polysubstance abuse, syncope, physical deconditioning, B shoulder pain (patient reports he needs both replaced), appendectomy, chronic stiffness in B hips, prior B hip pinning, B hip dysfunciton since childhood, low back pain, TBI. Patient denies hx of cancer, stroke, seizures, heart problems, diabetes, unexplained weight loss, unexplained changes in bowel or bladder problems, unexplained stumbling or dropping things, osteoporosis, and spinal surgery.    PRECAUTIONS: Falls  SUBJECTIVE:  SUBJECTIVE STATEMENT:  Patient arrives on broken RW and states he is fed up with a lot of things in his life. He states he is tired of hurting, tired of using  the broken walker, and he has been sick with a GI illness that has kept him from PT. He states he has been hobbling outside without  his RW to smoke and has not fallen. He thinks he is getting a little stronger. He wants to get a job. He states [place] lives [place], and he is tired of being broke. He states he wants to find a place where he can live alone, needs to get a job, and needs a lot of money. He feels a lot of his pain stems from his back and his shoulders are just going to have to wait for surgery. He has not gotten a single point cane. He saw one at [facility] for $18 and did not feel he could spare that much money for that. He states he has not been able to get to a thrift store. He does not have someone like a Child psychotherapist to talk to about manage the complexities of life or his emotion and has not found that useful in the past. He states he is feeling angry today. Patient was smoking outside the clinic doors upon arrival and was informed by PT we are a non-smoking facility and the smoke comes in the doors when people smoke there. He has been doing some HEP including sit <> stand.    PAIN:  NPRS 0/10 when sitting and 5-7/10 at  lateral left hip when walking and weight bearing on it.    OBJECTIVE  TODAY'S TREATMENT:    Therapeutic exercise: to centralize symptoms and improve ROM, strength, muscular endurance, and activity tolerance required for successful completion of functional activities.  - NuStep using bilateral upper and lower extremities. Seat/handle setting 12/13. For improved extremity mobility, muscular endurance, and activity tolerance; and to induce the analgesic effect of aerobic exercise, stimulate improved joint nutrition, and prepare body structures and systems for following interventions. Level 5 x 5  minutes. 20:40 second intervals for 5 min at level 10/5. Goal to stay at or above 70 SPM.  Average SPM = 79. To improve work capacity and functional endurance for longer ambulation  and produce endorphins for pain control.and general wellbeing - ambulation 200 feet with SPC (reports brief dizziness, then fatigue/pain in shoulders).  - ambulation 300 feet with SPC (back starting to hurt at then end).  - Standing R foot tap and down to 8 inch step with R UE on SPC. 3x10 (SBA).  Pt required multimodal cuing for proper technique and to facilitate improved neuromuscular control, strength, range of motion, and functional ability resulting in improved performance and form.   PATIENT EDUCATION:  Education details: Exercise purpose/form. Self management techniques. Education on diagnosis, prognosis, POC, anatomy and physiology of current condition Education on HEP including handout  Reviewed cancelation/no-show policy with patient; patient verbalized understanding (12/13/22). Person educated: Patient Education method: Explanation Education comprehension: verbalized understanding and needs further education   HOME EXERCISE PROGRAM: Access Code: [email or web address] URL: [email or web address] Date: 12/18/2022 Prepared by: Norton Blizzard  Exercises - Sit to Stand Without Arm Support  - 1 x daily - 3 sets - 10 reps - Static Prone on Elbows  - 2 x daily - 1 sets - 5 reps - 30 seconds hold   ASSESSMENT:   CLINICAL IMPRESSION: Patient arrives with a lot of anger he  wanted to get off his chest by talking about the frustrations in his life. He has not been able to get a SPC, complicated by lack of transportation and money. He does appear slightly stronger today for ambulation with SPC but continues to fatigue quickly. Session continued to focus on improving functional strength and work capacity for functional tasks such as household and community ambulation without as much support from AD. He continues to be unable to stand on L LE without UE support due to L hip weakness and pain. Plan to look into options to assist patient with getting a  SPC which would significantly improve his  ability to make progress towards his goal of ambulating safely with no AD. Patient reported he felt better by end of session. Patient would benefit from continued management of limiting condition by skilled physical therapist to address remaining impairments and functional limitations to work towards stated goals and return to PLOF or maximal functional independence.   From Initial PT Eval 12/13/2022: Patient is a 62 y.o. male referred to outpatient physical therapy with a medical diagnosis of s/p total replacement of left hip who presents with signs and symptoms consistent with left hip flexion contracture, left hip weakness, generalized weakness and unsteadiness on feet keeping him from ambulating independently s/p L THA on 09/17/2022. 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 Patient had R THA performed in August 2023 but was still very limited in reaching upright standing posture due to pain and stiffness at left hip and lumbar spine stiffness. He now has much better potential for improvement following joint replacements in both hips. He is very weak from prolonged inability to stand or move in an upright posture and has remaining stiffness in L > R hip from prolonged joint dysfunction prior to surgeries. Patient appears to have excellent potential to return to ambulation with no AD but will require prolonged strengthening and rehab due to the severity of his current weakness in B hips (L>R) especially hip abduction that does not yet let him maintain SLS (MML 2/5 on left and 3+/5 on right). Patient presents with significant pain, ROM, muscle length, joint stiffness, spine posture, balance, proprioceptive, motor control, gait, muscle performance (strength/power/endurance), and activity tolerance impairments that are limiting ability to complete usual activities including household and community ambulation, transfers, and bed mobility, standing, stairs, squatting, mobility  without falling, walking without walker, avoiding falls, being steady on feet, walking for exercise, walking downtown, standing up straight, unable to have shoulders replaced since he is dependent on UE weight bearing while walking, getting in and out of the car, dressing without difficulty. Patient will benefit from skilled physical therapy intervention to address current body structure impairments and activity limitations to improve function and work towards goals set in current POC in order to return to prior level of function or maximal functional improvement.      OBJECTIVE IMPAIRMENTS: Abnormal gait, decreased activity tolerance, decreased balance, decreased coordination, decreased endurance, decreased knowledge of condition, decreased knowledge of use of DME, decreased mobility, difficulty walking, decreased ROM, decreased strength, hypomobility, increased fascial restrictions, impaired perceived functional ability, increased muscle spasms, impaired flexibility, impaired UE functional use, improper body mechanics, postural dysfunction, obesity, and pain.    ACTIVITY LIMITATIONS: carrying, lifting, bending, standing, squatting, stairs, transfers, bed mobility, bathing, toileting, dressing, reach over head, locomotion level, and caring for others   PARTICIPATION LIMITATIONS: meal prep, cleaning, laundry, interpersonal relationship, shopping, community activity, yard  work, and   difficulty with usual activities including household and community ambulation, transfers, and bed mobility, standing, stairs, squatting, mobility without falling, walking without walker, avoiding falls, being steady on feet, walking for exercise, walking downtown, standing up straight, unable to have shoulders replaced since he is dependent on UE weight bearing while walking, getting in and out of the car, dressing   PERSONAL FACTORS: Fitness, Past/current experiences, Time since onset of injury/illness/exacerbation, and 3+  comorbidities:   s/p R THA 06/14/2022, 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    REHAB POTENTIAL: Excellent   CLINICAL DECISION MAKING: Evolving/moderate complexity   EVALUATION COMPLEXITY: Moderate     GOALS: Goals reviewed with patient? No   SHORT TERM GOALS: Target date: 12/27/2022   Patient will be independent with initial home exercise program for self-management of symptoms. Baseline: Initial HEP provided at IE (12/13/22); participating some (12/27/2022);  Goal status: Partially met     LONG TERM GOALS: Target date: 03/07/2023   Patient will be independent with a long-term home exercise program for self-management of symptoms.  Baseline: Initial HEP provided at IE (12/13/22); participating some (12/27/2022);  Goal status: In-progress   2.  Patient will demonstrate improved FOTO to equal or greater than 49 by visit #13 to demonstrate improvement in overall condition and self-reported functional ability.  Baseline: 36 (12/13/22); 30 at visit #3 (12/27/2022); 36 at visit #5 (02/05/2023); Goal status: In-progress   3.  Patient will demonstrate ability to ambulate equal or greater than 1200 feet on 6 Min Walk Test with [facility] or less restrictive assistive device to help him return to walking for health and exercise in his community.  Baseline: 600 feet with RW (12/13/22); able to ambulate 200 feet at a [facility] and SBA-CGA (12/27/2022); 434 feet with SPC and SBA (02/05/2023);  Goal status: In-progress   4.  Patient will complete 5 Times Sit To Stand from 18.5 inch surface or lower in equal or less than 11 seconds with no UE support to  demonstrate improved fall risk and functional strength for household and community mobility and self care.  Baseline: 19 seconds from 18.5 inch plinth with use of B UE on table, RW in front for safety.  (12/13/22); not tested (12/27/2022); 22 seconds with no UE use (02/05/23);  Goal status: In-progress   5.  Patient will complete community, work and/or recreational activities with 75% less limitation due to current condition.  Baseline: difficulty with usual activities including household and community ambulation, transfers, and bed mobility, standing, stairs, squatting, mobility without falling, walking without walker, avoiding falls, being steady on feet, walking for exercise, walking downtown, standing up straight, unable to have shoulders replaced since he is dependent on UE weight bearing while walking, getting in and out of the car, dressing (12/13/22); peple have been commenting he is qucker getting up out of his chair ( 12/27/2022); improving but still far from his goal and still very impaired (02/05/2023);  Goal status: In-progress   6.  Patient will demonstrate the ability to perform single leg stance without trendelenburg for equal or greater than 10 seconds on each side to demonstrate improved hip strength for normal and functional gait pattern for household and  community mobility without and AD with decreased risk of falls.  Baseline: > 1 second on R, unable on left due to pain and weakness and trendelenburg sign (12/13/2022; 12/27/2022); < 1 second/unable (02/05/2023);  Goal status: In-progress     PLAN:   PT FREQUENCY: 1-2x/week   PT DURATION: 12 weeks   PLANNED INTERVENTIONS: Therapeutic exercises, Therapeutic activity, Neuromuscular re-education, Balance training, Gait training, Patient/Family education, Self Care, Joint mobilization, Stair training, DME instructions, Aquatic Therapy, Dry Needling, Electrical stimulation, Spinal mobilization, Cryotherapy, Moist heat, Manual therapy, and  Re-evaluation   PLAN FOR NEXT SESSION: update HEP as appropriate, progressive LE/hip/functional strengthening with particular attention to hip abduction, interventions for improved ROM (especially hip and low back extension), balance and gait training as appropriate, manual therapy as needed.    Cira Rue, PT, DPT 02/20/2023, 2:10 PM  [facility] [address] [place], [place] [address] P: [phone] I F: [phone]

## 2023-02-27 ENCOUNTER — Ambulatory Visit: Payer: Medicaid Other | Admitting: Physical Therapy

## 2023-03-05 ENCOUNTER — Ambulatory Visit: Payer: Medicaid Other | Admitting: Physical Therapy

## 2023-03-13 ENCOUNTER — Ambulatory Visit: Payer: Medicaid Other | Admitting: Physical Therapy

## 2023-03-13 ENCOUNTER — Encounter: Payer: Self-pay | Admitting: Physical Therapy

## 2023-03-13 NOTE — Therapy (Signed)
[facility] [address], [place], [address] Phone: [phone]   Fax:  [phone]  Patient Details  Name: Johnny Banks MRN: [id number] Date of Birth: [birth year] Referring Provider:  No ref. provider found  Encounter Date: 03/13/2023  Due to transportation issues, patient arrived too late for his PT appointment scheduled at 9:45am. However, a SPC had been ordered through the charitable fund and has been waiting for patient to come in to get it. Patient was given the [facility] and brief education on how to adjust it and use it on the curb. Next scheduled appointment Wed 03/20/2023 confirmed with patient and transportation.    Luretha Murphy. Ilsa Iha, PT, DPT 03/13/23, 10:26 AM  [facility] [address] [place], [address] Phone: [phone]   Fax:  [phone]

## 2023-03-19 ENCOUNTER — Ambulatory Visit (INDEPENDENT_AMBULATORY_CARE_PROVIDER_SITE_OTHER): Payer: Medicaid Other | Admitting: Dermatology

## 2023-03-19 ENCOUNTER — Telehealth: Payer: Self-pay

## 2023-03-19 VITALS — BP 139/90

## 2023-03-19 DIAGNOSIS — L299 Pruritus, unspecified: Secondary | ICD-10-CM | POA: Diagnosis not present

## 2023-03-19 DIAGNOSIS — D492 Neoplasm of unspecified behavior of bone, soft tissue, and skin: Secondary | ICD-10-CM

## 2023-03-19 DIAGNOSIS — Z79899 Other long term (current) drug therapy: Secondary | ICD-10-CM

## 2023-03-19 DIAGNOSIS — L720 Epidermal cyst: Secondary | ICD-10-CM | POA: Diagnosis not present

## 2023-03-19 DIAGNOSIS — L304 Erythema intertrigo: Secondary | ICD-10-CM | POA: Diagnosis not present

## 2023-03-19 MED ORDER — MUPIROCIN 2 % EX OINT: 1.0000 | TOPICAL_OINTMENT | Freq: Every day | CUTANEOUS | 1 refills | Status: DC

## 2023-03-19 MED ORDER — HYDROXYZINE HCL 25 MG PO TABS: ORAL_TABLET | ORAL | 2 refills | Status: DC

## 2023-03-19 NOTE — Telephone Encounter (Signed)
Spoke to Kiron, patients caregiver at group home, to see how patient was doing.  She said he had gone out and she would have him call if any issues after today's surgery.Marguerite Olea

## 2023-03-19 NOTE — Patient Instructions (Addendum)

## 2023-03-19 NOTE — Progress Notes (Unsigned)
Follow-Up Visit   Subjective  Johnny Banks is a 62 y.o. male who presents for the following: cyst (Scrotum, pt presents for excision) and intertrigo (Groin, Elidel cr qam, Ketoconazole 2% cr qhs, pt still itching, would like refill of Hydroxyzine).  The following portions of the chart were reviewed this encounter and updated as appropriate:   Tobacco  Allergies  Meds  Problems  Med Hx  Surg Hx  Fam Hx      Review of Systems:  No other skin or systemic complaints except as noted in HPI or Assessment and Plan.  Objective  Well appearing patient in no apparent distress; mood and affect are within normal limits.  A focused examination was performed including groin. Relevant physical exam findings are noted in the Assessment and Plan.  Lat to medial R scrotum Cystic pap 0.6cm  Lat t medial R scrotum Cystic pap 1.6cm  Lat to medial R scrotum Cystic pap 0.4cm  Lat to medial R scrotum Cystic pap 1.1cm  Lat to medial R scrotum Cystic pap 0.7cm  scrotum Cystic paps    Assessment & Plan  Neoplasm of skin (5) Lat to medial R scrotum  mupirocin ointment (BACTROBAN) 2 % Apply 1 Application topically daily. Qd to excision site  Skin excision  Lesion length (cm):  0.6 Lesion width (cm):  0.6 Total excision diameter (cm):  0.6 Informed consent: discussed and consent obtained   Timeout: patient name, date of birth, surgical site, and procedure verified   Procedure prep:  Patient was prepped and draped in usual sterile fashion Prep type:  Isopropyl alcohol and povidone-iodine Anesthesia: the lesion was anesthetized in a standard fashion   Anesthetic:  1% lidocaine w/ epinephrine 1-100,000 buffered w/ 8.4% NaHCO3 (1.2cc of lido w/epi, 0.6cc of bupivicain, Total of  1.8cc) Instrument used comment:  #15c blade Hemostasis achieved with: pressure   Hemostasis achieved with comment:  Electrocautery Outcome: patient tolerated procedure well with no complications    Post-procedure details: sterile dressing applied and wound care instructions given   Dressing type: bandage, pressure dressing and bacitracin (Mupirocin)   Additional details:  Secondary intention healing  Specimen 1 - Surgical pathology Differential Diagnosis: D48.5 Cyst vs other  Check Margins: No Cystic pap 0.6cm  Lat t medial R scrotum  Skin excision  Lesion length (cm):  1.6 Lesion width (cm):  1.6 Margin per side (cm):  0 Total excision diameter (cm):  1.6 Informed consent: discussed and consent obtained   Timeout: patient name, date of birth, surgical site, and procedure verified   Procedure prep:  Patient was prepped and draped in usual sterile fashion Prep type:  Isopropyl alcohol and povidone-iodine Anesthesia: the lesion was anesthetized in a standard fashion   Anesthetic:  1% lidocaine w/ epinephrine 1-100,000 buffered w/ 8.4% NaHCO3 (1.2cc of lido w/epi, 0.6cc of bupivicain, Total of 1.8cc) Instrument used comment:  #15c blade Hemostasis achieved with: pressure   Hemostasis achieved with comment:  Electrocautery Outcome: patient tolerated procedure well with no complications   Post-procedure details: sterile dressing applied and wound care instructions given   Dressing type: bandage, pressure dressing and bacitracin (Mupirocin)   Additional details:  Secondary intention healing  Specimen 2 - Surgical pathology Differential Diagnosis: Cyst vs other  Check Margins: No Cystic pap 1.6cm  Lat to medial R scrotum  Skin excision  Lesion length (cm):  0.4 Lesion width (cm):  0.4 Margin per side (cm):  0 Total excision diameter (cm):  0.4 Informed consent: discussed and  consent obtained   Timeout: patient name, date of birth, surgical site, and procedure verified   Procedure prep:  Patient was prepped and draped in usual sterile fashion Prep type:  Isopropyl alcohol and povidone-iodine Anesthesia: the lesion was anesthetized in a standard fashion   Anesthetic:   1% lidocaine w/ epinephrine 1-100,000 buffered w/ 8.4% NaHCO3 (1.2cc of lido w/epi, 0.6cc of bupivicain, Total of 1.8cc) Instrument used comment:  #15c blade Hemostasis achieved with: pressure   Hemostasis achieved with comment:  Electrocautery Outcome: patient tolerated procedure well with no complications   Post-procedure details: sterile dressing applied and wound care instructions given   Dressing type: bandage, pressure dressing and bacitracin (Mupirocin)   Additional details:  Secondary intention healing  Specimen 3 - Surgical pathology Differential Diagnosis: Cyst vs other  Check Margins: No Cystic pap 0.4cm  Lat to medial R scrotum  Skin excision  Lesion length (cm):  1.1 Lesion width (cm):  1.1 Margin per side (cm):  0 Total excision diameter (cm):  1.1 Informed consent: discussed and consent obtained   Timeout: patient name, date of birth, surgical site, and procedure verified   Procedure prep:  Patient was prepped and draped in usual sterile fashion Prep type:  Isopropyl alcohol and povidone-iodine Anesthesia: the lesion was anesthetized in a standard fashion   Anesthetic:  1% lidocaine w/ epinephrine 1-100,000 buffered w/ 8.4% NaHCO3 (1.2cc of lido w/epi, 0.6cc of bupivicain, Total of 1.8cc) Instrument used comment:  #15c blade Hemostasis achieved with: pressure   Hemostasis achieved with comment:  Electrocautery Outcome: patient tolerated procedure well with no complications   Post-procedure details: sterile dressing applied and wound care instructions given   Dressing type: bandage, pressure dressing and bacitracin (Mupirocin)   Additional details:  Secondary intention healing  Specimen 4 - Surgical pathology Differential Diagnosis: Cyst vs other  Check Margins: No Cystic pap 1.1cm  Lat to medial R scrotum  Skin excision  Lesion length (cm):  0.7 Lesion width (cm):  0.7 Margin per side (cm):  0 Total excision diameter (cm):  0.7 Informed consent:  discussed and consent obtained   Timeout: patient name, date of birth, surgical site, and procedure verified   Procedure prep:  Patient was prepped and draped in usual sterile fashion Prep type:  Isopropyl alcohol and povidone-iodine Anesthesia: the lesion was anesthetized in a standard fashion   Anesthetic:  1% lidocaine w/ epinephrine 1-100,000 buffered w/ 8.4% NaHCO3 (1.2cc of lido w/epi, 0.6cc of bupivicain, Total of 1.8cc) Instrument used comment:  #15c blade Hemostasis achieved with: pressure   Hemostasis achieved with comment:  Electrocautery Outcome: patient tolerated procedure well with no complications   Post-procedure details: sterile dressing applied and wound care instructions given   Dressing type: bandage, pressure dressing and bacitracin (Mupirocin)   Additional details:  Secondary intention healing  Specimen 5 - Surgical pathology Differential Diagnosis: Cyst vs other  Check Margins: No Cystic pap 0.7cm  Cyst vs other Start Mupirocin oint qd to excision sites  Epidermal cyst scrotum  Benign-appearing. Exam most consistent with an epidermal inclusion cyst. Discussed that a cyst is a benign growth that can grow over time and sometimes get irritated or inflamed. Recommend observation if it is not bothersome. Discussed option of surgical excision to remove it if it is growing, symptomatic, or other changes noted. Please call for new or changing lesions so they can be evaluated.   Discussed excising a few at a time    INTERTRIGO with PRURITUS  Exam Erythematous macerated patches  Intertrigo is a chronic recurrent rash that occurs in skin fold areas that may be associated with friction; heat; moisture; yeast; fungus; and bacteria.  It is exacerbated by increased movement / activity; sweating; and higher atmospheric temperature.  Treatment Plan Cont Elidel cr qam Cont Ketoconazole 2% cr qhs  Start Hydroxyzine 25mg  1 po hs prn itching, may make drowsy  Return for  to be scheduled for surgery of cysts on scrotum.  I, Ardis Rowan, RMA, am acting as scribe for Armida Sans, MD . Documentation: I have reviewed the above documentation for accuracy and completeness, and I agree with the above.  Armida Sans, MD

## 2023-03-20 ENCOUNTER — Ambulatory Visit: Payer: Medicaid Other | Admitting: Physical Therapy

## 2023-03-21 ENCOUNTER — Encounter: Payer: Self-pay | Admitting: Dermatology

## 2023-03-26 ENCOUNTER — Telehealth: Payer: Self-pay

## 2023-03-26 NOTE — Telephone Encounter (Signed)
-----   Message from Deirdre Evener, MD sent at 03/26/2023 11:16 AM EDT ----- Diagnosis 1. Skin (M), lat to medial right scrotum EPIDERMAL INCLUSION CYST 2. Skin (M), lat to medial right scrotum EPIDERMAL INCLUSION CYST 3. Skin (M), lat to medial right scrotum EPIDERMAL INCLUSION CYST 4. Skin (M), lat to medial right scrotum EPIDERMAL INCLUSION CYST 5. Skin (M), lat to medial right scrotum EPIDERMAL INCLUSION CYST  1,2,3,4,5 - All five benign cyst

## 2023-03-26 NOTE — Telephone Encounter (Signed)
Patient c/o of itching and a little swelling in the biopsy area, discussed with patient itching and swelling  could be from healing,  Patient report no pain, ok watch the swelling-if any changes or area get painful or tender please call back

## 2023-03-26 NOTE — Telephone Encounter (Signed)
Discussed biopsy results.

## 2023-03-27 ENCOUNTER — Ambulatory Visit: Payer: Medicaid Other | Attending: Physician Assistant | Admitting: Physical Therapy

## 2023-03-27 ENCOUNTER — Telehealth: Payer: Self-pay | Admitting: Physical Therapy

## 2023-03-27 DIAGNOSIS — R262 Difficulty in walking, not elsewhere classified: Secondary | ICD-10-CM | POA: Insufficient documentation

## 2023-03-27 DIAGNOSIS — M25652 Stiffness of left hip, not elsewhere classified: Secondary | ICD-10-CM | POA: Insufficient documentation

## 2023-03-27 DIAGNOSIS — M25552 Pain in left hip: Secondary | ICD-10-CM | POA: Insufficient documentation

## 2023-03-27 DIAGNOSIS — R293 Abnormal posture: Secondary | ICD-10-CM | POA: Insufficient documentation

## 2023-03-27 NOTE — Telephone Encounter (Signed)
Called patient when he did not show up for his PT appointment scheduled for 9:45am. No answer.    During call support staff let PT know by message that patient had left a VM about 10 min after his appointment time that he had to cancel due to no transportation.   Luretha Murphy. Ilsa Iha, PT, DPT 03/27/23, 10:08 AM  [facility] [address] [place], [place] [address] P: [phone] I F: [phone]

## 2023-04-02 ENCOUNTER — Encounter: Payer: Medicaid Other | Admitting: Dermatology

## 2023-04-03 ENCOUNTER — Ambulatory Visit: Payer: Medicaid Other | Admitting: Physical Therapy

## 2023-04-09 ENCOUNTER — Telehealth: Payer: Self-pay

## 2023-04-09 NOTE — Telephone Encounter (Signed)
Patient called for a refill of Hydroxyzine tablet 25 mg, will you approve this refill?  Pharmacy is [facility] in [place]

## 2023-04-10 ENCOUNTER — Encounter: Payer: Self-pay | Admitting: Physical Therapy

## 2023-04-10 ENCOUNTER — Ambulatory Visit: Payer: Medicaid Other | Admitting: Physical Therapy

## 2023-04-10 DIAGNOSIS — R262 Difficulty in walking, not elsewhere classified: Secondary | ICD-10-CM | POA: Diagnosis present

## 2023-04-10 DIAGNOSIS — M25552 Pain in left hip: Secondary | ICD-10-CM | POA: Diagnosis present

## 2023-04-10 DIAGNOSIS — M25652 Stiffness of left hip, not elsewhere classified: Secondary | ICD-10-CM | POA: Diagnosis present

## 2023-04-10 DIAGNOSIS — R293 Abnormal posture: Secondary | ICD-10-CM

## 2023-04-10 NOTE — Therapy (Signed)
OUTPATIENT PHYSICAL THERAPY TREATMENT NOTE / PROGRESS NOTE / RE-CERTIFICATION Dates of reporting 12/13/2022 to 04/10/2023   Patient Name: Johnny Banks MRN: [id number] DOB:[birth year], 62 y.o., male Today's Date: 04/10/2023  PCP: Fleet Contras, MD REFERRING PROVIDER: Sherley Bounds, PA   END OF SESSION:   PT End of Session - 04/10/23 1011     Visit Number 7    Number of Visits 24    Date for PT Re-Evaluation 07/03/23    Authorization Type MEDICAID [place] ACCESS reporting period from 12/13/2022    Authorization Time Period [employer] auth 4/3-6/25 for 12 PT visits    Authorization - Visit Number 2    Authorization - Number of Visits 12    Progress Note Due on Visit 10    PT Start Time 1015    PT Stop Time 1105    PT Time Calculation (min) 50 min    Activity Tolerance Patient tolerated treatment well    Behavior During Therapy WFL for tasks assessed/performed               Past Medical History:  Diagnosis Date   Bipolar 1 disorder (HCC)    Hypertension    TBI (traumatic brain injury) [facility])    Past Surgical History:  Procedure Laterality Date   APPENDECTOMY     ESOPHAGOGASTRODUODENOSCOPY N/A 12/20/2016   Procedure: ESOPHAGOGASTRODUODENOSCOPY (EGD);  Surgeon: Bernette Redbird, MD;  Location: [facility];  Service: Endoscopy;  Laterality: N/A;   HIP FRACTURE SURGERY     Patient Active Problem List   Diagnosis Date Noted   Bilateral shoulder pain 01/04/2017   Physical deconditioning    Symptomatic anemia    Tobacco abuse    Melena 12/19/2016   Acute blood loss anemia 12/19/2016   ETOH abuse 12/19/2016   UGIB (upper gastrointestinal bleed) 12/19/2016   Hypotension, unspecified 04/04/2013   Sepsis (HCC) 04/02/2013   Bipolar affective disorder (HCC) 04/02/2013   Polysubstance abuse (HCC) 04/02/2013   HTN (hypertension) 04/02/2013   Syncope 04/02/2013   Acute kidney injury (HCC) 04/02/2013    REFERRING DIAG: s/p total replacement of left hip   THERAPY DIAG:   Pain in left hip  Stiffness of left hip, not elsewhere classified  Abnormal posture  Difficulty in walking, not elsewhere classified  Rationale for Evaluation and Treatment: Rehabilitation  PERTINENT HISTORY: Patient is a 62 y.o. male who presents to outpatient physical therapy with a referral for medical diagnosis s/p total replacement of left hip. This patient's chief complaints consist of left hip pain, weakness, and stiffness, difficulty weight bearing, stooped posture, difficulty with mobility S/P L THA posterior approach 09/17/2022 leading to the following functional deficits: difficulty with usual activities including household and community ambulation, transfers, and bed mobility, standing, stairs, squatting, mobility without falling, walking without walker, avoiding falls, being steady on feet, walking for exercise, walking downtown, standing up straight, unable to have shoulders replaced since he is dependent on UE weight bearing while walking, getting in and out of the car, dressing. Relevant past medical history and comorbidities include s/p R THA 06/14/2022, osteopenia in left hip, current smoker, smoker's cough, HTN, ETOH abuse, bipolar affective disorder, bipolar I disorder, polysubstance abuse, syncope, physical deconditioning, B shoulder pain (patient reports he needs both replaced), appendectomy, chronic stiffness in B hips, prior B hip pinning, B hip dysfunciton since childhood, low back pain, TBI. Patient denies hx of cancer, stroke, seizures, heart problems, diabetes, unexplained weight loss, unexplained changes in bowel or bladder problems, unexplained stumbling or dropping  things, osteoporosis, and spinal surgery.    PRECAUTIONS: Falls  SUBJECTIVE:                                                                                                                                                                                      SUBJECTIVE STATEMENT:  Patient arrives using [facility]  today about 20 min late. He states he as been doing pretty good. He is walking a lot better. He states when it was raining last week his right leg was hurting more than his left. He states his left hip has not been hurting as much. He states his back is giving him the most trouble when he is standing and walking. He states he continues to have trouble getting a reliable ride to PT. He states he has been doing pretty good. 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 He last had left hip pain when walking. He sees Dr. Ardis Rowan his surgeon June 27th because he had to cancel his last appointment with him.    PAIN:  NPRS 0/10 when sitting, up to "off the charts" in his low back when standing and walking. Back pain when standing is at least 5/10.    OBJECTIVE   SELF-REPORTED FUNCTION FOTO score: 52/100 (hip questionnaire)   FUNCTIONAL/BALANCE 6 Minute Walk Test: 400 feet with SPC in R UE, discontinued due to back pain.    5 Times Sit To Stand Test: 18 seconds with no UE use from 18.5 inch plinth.    L Single Leg Stance: < 1 second/unable.      TODAY'S TREATMENT:    Therapeutic exercise: to centralize symptoms and improve ROM, strength, muscular endurance, and activity tolerance required for successful completion of functional activities.  - 6 minute walk test to assess progress (see above) - 5 Time Sit To Stand test to assess progress (see above).  - single leg stance attempt to assess progress(see above).  - supine modified thomas stretch, 3x1 min left side.  - hooklying bridge with toes up and abdominal brace, 2x10 with 5 second hold.  - standing hip hike off floor, B UE support, unable to do well.  - standing hip abduction/extension diagonal, 2x5-10 each direction AROM with B UE support.  - sled push, 1x25 feet with 50# added, 6x25 feet with 90#. Using vertical poles. PT turning the  sled.   Pt required multimodal cuing for proper technique and to facilitate improved neuromuscular control, strength, range of motion, and functional ability resulting in improved performance and form.  PATIENT EDUCATION:  Education details: Exercise purpose/form. Self management techniques. Education on diagnosis, prognosis, POC, anatomy and physiology of current condition Education on HEP including handout  Reviewed cancelation/no-show policy with patient; patient verbalized understanding (12/13/22). Person educated: Patient Education method: Explanation Education comprehension: verbalized understanding and needs further education   HOME EXERCISE PROGRAM: Access Code: [email or web address] URL: [email or web address] Date: 04/10/2023 Prepared by: Norton Blizzard  Exercises - Modified Thomas Stretch  - 1 x daily - 3 reps - 1 minute hold - Bridge on Heels  - 1 x daily - 2 sets - 10 reps - 5 seconds hold - Diagonal Hip Extension  - 1 x daily - 2 sets - 5 reps   ASSESSMENT:   CLINICAL IMPRESSION: Patient has attended 7 physical therapy sessions since starting current episode of care on 12/13/2022. He has had tremendous trouble with attendance due to unreliable transportation at his group home. He returns today after not being able to attend PT since 02/20/2023. He received a cane obtained through the [facility] charitable fund on 03/13/2023 when he arrived to PT too late to complete a visit. Since then he has transitioned to ambulating with SPC instead of RW and today arrives with improvement in balance and mobility with less restrictive assistive device. He also reports improved pain in his hip but is limited by low back pain that is exacerbated by L hip flexor tightness that does not allow him to stand up fully straight. He has exceeded his FOTO goal, and is making varying amounts of progress towards remaining goals. Patient continues to have good potential for improvement with PT but needs to be  able to get to the clinic more reliably. Today's visit focused on re-assessment as well as update of HEP and re-emphasis on importance of completing the exercises regularly. Plan to continue PT at a rate of 1x a week for the next 12 weeks or as allowed by insurance authorization to help return patient to prior level of function. Patient would benefit from continued management of limiting condition by skilled physical therapist to address remaining impairments and functional limitations to work towards stated goals and return to PLOF or maximal functional independence.   From Initial PT Eval 12/13/2022: Patient is a 62 y.o. male referred to outpatient physical therapy with a medical diagnosis of s/p total replacement of left hip who presents with signs and symptoms consistent with left hip flexion contracture, left hip weakness, generalized weakness and unsteadiness on feet keeping him from ambulating independently s/p L THA on 09/17/2022. 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 Patient had R THA performed in August 2023 but was still very limited in reaching upright standing posture due to pain and stiffness at left hip and lumbar spine stiffness. He now has much better potential for improvement following joint replacements in both hips. He is very weak from prolonged inability to stand or move in an upright posture and has remaining stiffness in L > R hip from prolonged joint dysfunction prior to surgeries. Patient appears to have excellent potential to return to ambulation with no AD but will require prolonged strengthening and rehab due to the severity of his current weakness in B hips (L>R) especially hip abduction that does not yet let him maintain SLS (MML 2/5 on left and 3+/5 on right). Patient presents with significant pain, ROM, muscle length, joint stiffness, spine posture, balance, proprioceptive, motor control, gait, muscle performance  (strength/power/endurance), and activity tolerance  impairments that are limiting ability to complete usual activities including household and community ambulation, transfers, and bed mobility, standing, stairs, squatting, mobility without falling, walking without walker, avoiding falls, being steady on feet, walking for exercise, walking downtown, standing up straight, unable to have shoulders replaced since he is dependent on UE weight bearing while walking, getting in and out of the car, dressing without difficulty. Patient will benefit from skilled physical therapy intervention to address current body structure impairments and activity limitations to improve function and work towards goals set in current POC in order to return to prior level of function or maximal functional improvement.      OBJECTIVE IMPAIRMENTS: Abnormal gait, decreased activity tolerance, decreased balance, decreased coordination, decreased endurance, decreased knowledge of condition, decreased knowledge of use of DME, decreased mobility, difficulty walking, decreased ROM, decreased strength, hypomobility, increased fascial restrictions, impaired perceived functional ability, increased muscle spasms, impaired flexibility, impaired UE functional use, improper body mechanics, postural dysfunction, obesity, and pain.    ACTIVITY LIMITATIONS: carrying, lifting, bending, standing, squatting, stairs, transfers, bed mobility, bathing, toileting, dressing, reach over head, locomotion level, and caring for others   PARTICIPATION LIMITATIONS: meal prep, cleaning, laundry, interpersonal relationship, shopping, community activity, yard work, and   difficulty with usual activities including household and community ambulation, transfers, and bed mobility, standing, stairs, squatting, mobility without falling, walking without walker, avoiding falls, being steady on feet, walking for exercise, walking downtown, standing up straight, unable to have  shoulders replaced since he is dependent on UE weight bearing while walking, getting in and out of the car, dressing   PERSONAL FACTORS: Fitness, Past/current experiences, Time since onset of injury/illness/exacerbation, and 3+ comorbidities:   s/p R THA 06/14/2022, 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    REHAB POTENTIAL: Excellent   CLINICAL DECISION MAKING: Evolving/moderate complexity   EVALUATION COMPLEXITY: Moderate     GOALS: Goals reviewed with patient? No   SHORT TERM GOALS: Target date: 12/27/2022   Patient will be independent with initial home exercise program for self-management of symptoms. Baseline: Initial HEP provided at IE (12/13/22); participating some (12/27/2022);  Goal status: Partially met     LONG TERM GOALS: Target date: 03/07/2023. Target date for all long term goals updated to 07/03/2023 on 04/10/2023.    Patient will be independent with a long-term home exercise program for self-management of symptoms.  Baseline: Initial HEP provided at IE (12/13/22); participating some (12/27/2022); mostly walking with SPC (04/10/2023);  Goal status: In-progress   2.  Patient will demonstrate improved FOTO to equal or greater than 49 by visit #13 to demonstrate improvement in overall condition and self-reported functional ability.  Baseline: 36 (12/13/22); 30 at visit #3 (12/27/2022); 36 at visit #5 (02/05/2023); 52 at visit #7 (04/10/2023);  Goal status: In-progress   3.  Patient will demonstrate ability to ambulate equal or greater than 1200 feet on 6 Min Walk Test with [facility] or less restrictive assistive device to help him return  to walking for health and exercise in his community.  Baseline: 600 feet with RW (12/13/22); able to ambulate 200 feet at a time with [facility] and [facility] (12/27/2022); 434 feet with SPC and SBA (02/05/2023); 400 feet with SPC, mod I (  04/10/2023);  Goal status: Ongoing   4.  Patient will complete 5 Times Sit To Stand from 18.5 inch surface or lower in equal or less than 11 seconds with no UE support to demonstrate improved fall risk and functional strength for household and community mobility and self care.  Baseline: 19 seconds from 18.5 inch plinth with use of B UE on table, RW in front for safety.  (12/13/22); not tested (12/27/2022); 22 seconds with no UE use (02/05/23); 18 seconds with no UE use (04/10/2023);  Goal status: In-progress   5.  Patient will complete community, work and/or recreational activities with 75% less limitation due to current condition.  Baseline: difficulty with usual activities including household and community ambulation, transfers, and bed mobility, standing, stairs, squatting, mobility without falling, walking without walker, avoiding falls, being steady on feet, walking for exercise, walking downtown, standing up straight, unable to have shoulders replaced since he is dependent on UE weight bearing while walking, getting in and out of the car, dressing (12/13/22); peple have been commenting he is qucker getting up out of his chair ( 12/27/2022); improving but still far from his goal and still very impaired (02/05/2023); estimates 50-60% improvement (04/10/2023);  Goal status: In-progress   6.  Patient will demonstrate the ability to perform single leg stance without trendelenburg for equal or greater than 10 seconds on each side to demonstrate improved hip strength for normal and functional gait pattern for household and community mobility without and AD with decreased risk of falls.  Baseline: > 1 second on R, unable on left due to pain and weakness and trendelenburg sign (12/13/2022;  12/27/2022); < 1 second/unable (02/05/2023;04/10/2023);  Goal status: In-progress     PLAN:   PT FREQUENCY: 1-2x/week   PT DURATION: 12 weeks   PLANNED INTERVENTIONS: Therapeutic exercises, Therapeutic activity, Neuromuscular re-education, Balance training, Gait training, Patient/Family education, Self Care, Joint mobilization, Stair training, DME instructions, Aquatic Therapy, Dry Needling, Electrical stimulation, Spinal mobilization, Cryotherapy, Moist heat, Manual therapy, and Re-evaluation   PLAN FOR NEXT SESSION: update HEP as appropriate, progressive LE/hip/functional strengthening with particular attention to hip abduction, interventions for improved ROM (especially hip and low back extension), balance and gait training as appropriate, manual therapy as needed.    Cira Rue, PT, DPT 04/10/2023, 11:16 AM  [facility] [address] [place], [place] [address] P: [phone] I F: [phone]

## 2023-04-17 ENCOUNTER — Ambulatory Visit: Payer: Medicaid Other | Attending: Physician Assistant | Admitting: Physical Therapy

## 2023-04-17 DIAGNOSIS — M25552 Pain in left hip: Secondary | ICD-10-CM | POA: Insufficient documentation

## 2023-04-17 DIAGNOSIS — M25652 Stiffness of left hip, not elsewhere classified: Secondary | ICD-10-CM | POA: Insufficient documentation

## 2023-04-17 DIAGNOSIS — R293 Abnormal posture: Secondary | ICD-10-CM | POA: Insufficient documentation

## 2023-04-17 DIAGNOSIS — R262 Difficulty in walking, not elsewhere classified: Secondary | ICD-10-CM | POA: Insufficient documentation

## 2023-04-18 ENCOUNTER — Other Ambulatory Visit: Payer: Self-pay

## 2023-04-18 ENCOUNTER — Telehealth: Payer: Self-pay

## 2023-04-18 MED ORDER — HYDROXYZINE HCL 25 MG PO TABS: ORAL_TABLET | ORAL | 2 refills | Status: AC

## 2023-04-18 NOTE — Telephone Encounter (Signed)
Hydroxyzine 25 mg 2 RF sent to [facility] pharmacy in [place]

## 2023-04-24 ENCOUNTER — Encounter: Payer: Self-pay | Admitting: Physical Therapy

## 2023-04-24 ENCOUNTER — Ambulatory Visit: Payer: Medicaid Other | Admitting: Physical Therapy

## 2023-04-24 DIAGNOSIS — R262 Difficulty in walking, not elsewhere classified: Secondary | ICD-10-CM

## 2023-04-24 DIAGNOSIS — R293 Abnormal posture: Secondary | ICD-10-CM

## 2023-04-24 DIAGNOSIS — M25552 Pain in left hip: Secondary | ICD-10-CM

## 2023-04-24 DIAGNOSIS — M25652 Stiffness of left hip, not elsewhere classified: Secondary | ICD-10-CM

## 2023-04-24 NOTE — Therapy (Signed)
OUTPATIENT PHYSICAL THERAPY TREATMENT NOTE   Patient Name: Johnny Banks MRN: [id number] DOB:04/08/1961, 62 y.o., male Today's Date: 04/24/2023  PCP: Fleet Contras, MD REFERRING PROVIDER: Sherley Bounds, PA   END OF SESSION:   PT End of Session - 04/24/23 1007     Visit Number 8    Number of Visits 24    Date for PT Re-Evaluation 07/03/23    Authorization Type MEDICAID [place] ACCESS reporting period from 12/13/2022    Authorization Time Period [employer] auth 4/3-6/25 for 12 PT visits    Authorization - Visit Number 3    Authorization - Number of Visits 12    Progress Note Due on Visit 10    PT Start Time 0945    PT Stop Time 1025    PT Time Calculation (min) 40 min    Activity Tolerance Patient tolerated treatment well    Behavior During Therapy WFL for tasks assessed/performed                Past Medical History:  Diagnosis Date   Bipolar 1 disorder (HCC)    Hypertension    TBI (traumatic brain injury) [facility])    Past Surgical History:  Procedure Laterality Date   APPENDECTOMY     ESOPHAGOGASTRODUODENOSCOPY N/A 12/20/2016   Procedure: ESOPHAGOGASTRODUODENOSCOPY (EGD);  Surgeon: Bernette Redbird, MD;  Location: [facility];  Service: Endoscopy;  Laterality: N/A;   HIP FRACTURE SURGERY     Patient Active Problem List   Diagnosis Date Noted   Bilateral shoulder pain 01/04/2017   Physical deconditioning    Symptomatic anemia    Tobacco abuse    Melena 12/19/2016   Acute blood loss anemia 12/19/2016   ETOH abuse 12/19/2016   UGIB (upper gastrointestinal bleed) 12/19/2016   Hypotension, unspecified 04/04/2013   Sepsis (HCC) 04/02/2013   Bipolar affective disorder (HCC) 04/02/2013   Polysubstance abuse (HCC) 04/02/2013   HTN (hypertension) 04/02/2013   Syncope 04/02/2013   Acute kidney injury (HCC) 04/02/2013    REFERRING DIAG: s/p total replacement of left hip   THERAPY DIAG:  Pain in left hip  Stiffness of left hip, not elsewhere  classified  Abnormal posture  Difficulty in walking, not elsewhere classified  Rationale for Evaluation and Treatment: Rehabilitation  PERTINENT HISTORY: Patient is a 62 y.o. male who presents to outpatient physical therapy with a referral for medical diagnosis s/p total replacement of left hip. This patient's chief complaints consist of left hip pain, weakness, and stiffness, difficulty weight bearing, stooped posture, difficulty with mobility S/P L THA posterior approach 09/17/2022 leading to the following functional deficits: difficulty with usual activities including household and community ambulation, transfers, and bed mobility, standing, stairs, squatting, mobility without falling, walking without walker, avoiding falls, being steady on feet, walking for exercise, walking downtown, standing up straight, unable to have shoulders replaced since he is dependent on UE weight bearing while walking, getting in and out of the car, dressing. Relevant past medical history and comorbidities include s/p R THA 06/14/2022, osteopenia in left hip, current smoker, smoker's cough, HTN, ETOH abuse, bipolar affective disorder, bipolar I disorder, polysubstance abuse, syncope, physical deconditioning, B shoulder pain (patient reports he needs both replaced), appendectomy, chronic stiffness in B hips, prior B hip pinning, B hip dysfunciton since childhood, low back pain, TBI. Patient denies hx of cancer, stroke, seizures, heart problems, diabetes, unexplained weight loss, unexplained changes in bowel or bladder problems, unexplained stumbling or dropping things, osteoporosis, and spinal surgery.    PRECAUTIONS: Falls  SUBJECTIVE:                                                                                                                                                                                      SUBJECTIVE STATEMENT:  Patient arrives using SPC. He states his back is giving him the most trouble. He is not  clean-shaven because it hurts too much to stand long enough to shave. His back hurts him when standing so much it is hard for him to stand long enough to brush his teeth. He was really sick last week and his caregiver called EMS who checked him out and thought he was just sick. He is feeling better now. He states he slid out of bed and fell on his left hip, but has not had any further trouble with his left hip beyond what he normally experiences. He states he did his HEP every day except when he was sick.    PAIN:  NPRS 5/10 low back pain with standing.     OBJECTIVE   TODAY'S TREATMENT:    Therapeutic exercise: to centralize symptoms and improve ROM, strength, muscular endurance, and activity tolerance required for successful completion of functional activities.  - 150 feet ambulation with SPC (limited by pain in the back of his thighs).  - staggered stance sit <> stand from 18 inch chair, L foot back, right foot on target on floor. 3x10 with ~ 1 min rest between sets. Cuing to improve forward shift and hip/knee flexion to sit. 2nd chair in front to improve confidence in forwards shift.  - sled push, 4x2x25 feet with 90#. Using vertical poles. PT turning the sled. 1 min seated rest between sets.  - supine modified thomas stretch, 3x1 min left side.    Pt required multimodal cuing for proper technique and to facilitate improved neuromuscular control, strength, range of motion, and functional ability resulting in improved performance and form.   PATIENT EDUCATION:  Education details: Exercise purpose/form. Self management techniques. Education on diagnosis, prognosis, POC, anatomy and physiology of current condition Education on HEP including handout  Reviewed cancelation/no-show policy with patient; patient verbalized understanding (12/13/22). Person educated: Patient Education method: Explanation Education comprehension: verbalized understanding and needs further education   HOME EXERCISE  PROGRAM: Access Code: [email or web address] URL: [email or web address] Date: 04/10/2023 Prepared by: Norton Blizzard  Exercises - Modified Thomas Stretch  - 1 x daily - 3 reps - 1 minute hold - Bridge on Heels  - 1 x daily - 2 sets - 10 reps - 5 seconds hold - Diagonal Hip Extension  - 1 x daily - 2 sets - 5 reps   ASSESSMENT:   CLINICAL  IMPRESSION: Patient arrives reporting continued limitations from low back pain and hip weakness. Today's session continued working on LE strength and mobility. Patient fatigued quickly and reported lightheadedness after the first set of sled push. He was able to recover with seated rest and continued to complete exercises with sufficient rest. Patient continued to require PT cuing for exercise selection, form, appropriate rest, and education on his condition and rehab principles. Patient reports he feels better and his legs are very fatigues. Patient would benefit from continued management of limiting condition by skilled physical therapist to address remaining impairments and functional limitations to work towards stated goals and return to PLOF or maximal functional independence.   From Initial PT Eval 12/13/2022: Patient is a 62 y.o. male referred to outpatient physical therapy with a medical diagnosis of s/p total replacement of left hip who presents with signs and symptoms consistent with left hip flexion contracture, left hip weakness, generalized weakness and unsteadiness on feet keeping him from ambulating independently s/p L THA on 09/17/2022. 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 Patient had R THA performed in August 2023 but was still very limited in reaching upright standing posture due to pain and stiffness at left hip and lumbar spine stiffness. He now has much better potential for improvement following joint replacements in both hips. He is very weak from prolonged inability to stand or move in an upright  posture and has remaining stiffness in L > R hip from prolonged joint dysfunction prior to surgeries. Patient appears to have excellent potential to return to ambulation with no AD but will require prolonged strengthening and rehab due to the severity of his current weakness in B hips (L>R) especially hip abduction that does not yet let him maintain SLS (MML 2/5 on left and 3+/5 on right). Patient presents with significant pain, ROM, muscle length, joint stiffness, spine posture, balance, proprioceptive, motor control, gait, muscle performance (strength/power/endurance), and activity tolerance impairments that are limiting ability to complete usual activities including household and community ambulation, transfers, and bed mobility, standing, stairs, squatting, mobility without falling, walking without walker, avoiding falls, being steady on feet, walking for exercise, walking downtown, standing up straight, unable to have shoulders replaced since he is dependent on UE weight bearing while walking, getting in and out of the car, dressing without difficulty. Patient will benefit from skilled physical therapy intervention to address current body structure impairments and activity limitations to improve function and work towards goals set in current POC in order to return to prior level of function or maximal functional improvement.      OBJECTIVE IMPAIRMENTS: Abnormal gait, decreased activity tolerance, decreased balance, decreased coordination, decreased endurance, decreased knowledge of condition, decreased knowledge of use of DME, decreased mobility, difficulty walking, decreased ROM, decreased strength, hypomobility, increased fascial restrictions, impaired perceived functional ability, increased muscle spasms, impaired flexibility, impaired UE functional use, improper body mechanics, postural dysfunction, obesity, and pain.    ACTIVITY LIMITATIONS: carrying, lifting, bending, standing, squatting, stairs,  transfers, bed mobility, bathing, toileting, dressing, reach over head, locomotion level, and caring for others   PARTICIPATION LIMITATIONS: meal prep, cleaning, laundry, interpersonal relationship, shopping, community activity, yard work, and   difficulty with usual activities including household and community ambulation, transfers, and bed mobility, standing, stairs, squatting, mobility without falling, walking without walker, avoiding falls, being steady on feet, walking for exercise, walking downtown, standing up straight, unable to have shoulders replaced since he is dependent  on UE weight bearing while walking, getting in and out of the car, dressing   PERSONAL FACTORS: Fitness, Past/current experiences, Time since onset of injury/illness/exacerbation, and 3+ comorbidities:   s/p R THA 06/14/2022, 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    REHAB POTENTIAL: Excellent   CLINICAL DECISION MAKING: Evolving/moderate complexity   EVALUATION COMPLEXITY: Moderate     GOALS: Goals reviewed with patient? No   SHORT TERM GOALS: Target date: 12/27/2022   Patient will be independent with initial home exercise program for self-management of symptoms. Baseline: Initial HEP provided at IE (12/13/22); participating some (12/27/2022);  Goal status: Partially met     LONG TERM GOALS: Target date: 03/07/2023. Target date for all long term goals updated to 07/03/2023 on 04/10/2023.    Patient will be independent with a long-term home exercise program for self-management of symptoms.  Baseline: Initial HEP provided at IE (12/13/22); participating some  (12/27/2022); mostly walking with SPC (04/10/2023);  Goal status: In-progress   2.  Patient will demonstrate improved FOTO to equal or greater than 49 by visit #13 to demonstrate improvement in overall condition and self-reported functional ability.  Baseline: 36 (12/13/22); 30 at visit #3 (12/27/2022); 36 at visit #5 (02/05/2023); 52 at visit #7 (04/10/2023);  Goal status: In-progress   3.  Patient will demonstrate ability to ambulate equal or greater than 1200 feet on 6 Min Walk Test with [facility] or less restrictive assistive device to help him return to walking for health and exercise in his community.  Baseline: 600 feet with RW (12/13/22); able to ambulate 200 feet at a time with [facility] and SBA-C[facility] (12/27/2022); 434 feet with SPC and SBA (02/05/2023); 400 feet with SPC, mod I (04/10/2023);  Goal status: Ongoing   4.  Patient will complete 5 Times Sit To Stand from 18.5 inch surface or lower in equal or less than 11 seconds with no UE support to demonstrate improved fall risk and functional strength for household and community mobility and self care.  Baseline: 19 seconds from 18.5 inch plinth with use of B UE on table, RW in front for safety.  (12/13/22); not tested (12/27/2022); 22 seconds with no UE use (02/05/23); 18 seconds with no UE use (04/10/2023);  Goal status: In-progress   5.  Patient will complete community, work and/or recreational activities with 75% less limitation due to current condition.  Baseline: difficulty with usual activities including household and community ambulation, transfers, and bed mobility, standing, stairs, squatting, mobility without falling, walking without walker, avoiding falls, being steady on feet, walking for exercise, walking downtown, standing up straight, unable to have shoulders replaced since he is dependent on UE weight bearing while walking, getting in and out of the car, dressing (12/13/22); peple have been commenting he is qucker getting up out of his chair (  12/27/2022); improving but still far from his goal and still very impaired (02/05/2023); estimates 50-60% improvement (04/10/2023);  Goal status: In-progress   6.  Patient will demonstrate the ability to perform single leg stance without trendelenburg for equal or greater than 10 seconds on each side to demonstrate improved hip strength for normal and functional gait pattern for household and community mobility without and AD with decreased  risk of falls.  Baseline: > 1 second on R, unable on left due to pain and weakness and trendelenburg sign (12/13/2022; 12/27/2022); < 1 second/unable (02/05/2023;04/10/2023);  Goal status: In-progress     PLAN:   PT FREQUENCY: 1-2x/week   PT DURATION: 12 weeks   PLANNED INTERVENTIONS: Therapeutic exercises, Therapeutic activity, Neuromuscular re-education, Balance training, Gait training, Patient/Family education, Self Care, Joint mobilization, Stair training, DME instructions, Aquatic Therapy, Dry Needling, Electrical stimulation, Spinal mobilization, Cryotherapy, Moist heat, Manual therapy, and Re-evaluation   PLAN FOR NEXT SESSION: update HEP as appropriate, progressive LE/hip/functional strengthening with particular attention to hip abduction, interventions for improved ROM (especially hip and low back extension), balance and gait training as appropriate, manual therapy as needed.    Cira Rue, PT, DPT 04/24/2023, 10:34 AM  [facility] [address] [place], [place] [address] P: [phone] I F: [phone]

## 2023-05-01 ENCOUNTER — Ambulatory Visit: Payer: Medicaid Other | Admitting: Physical Therapy

## 2023-05-07 ENCOUNTER — Telehealth: Payer: Self-pay

## 2023-05-07 ENCOUNTER — Ambulatory Visit (INDEPENDENT_AMBULATORY_CARE_PROVIDER_SITE_OTHER): Payer: Medicaid Other | Admitting: Dermatology

## 2023-05-07 ENCOUNTER — Encounter: Payer: Self-pay | Admitting: Dermatology

## 2023-05-07 DIAGNOSIS — L720 Epidermal cyst: Secondary | ICD-10-CM | POA: Diagnosis not present

## 2023-05-07 DIAGNOSIS — Z79899 Other long term (current) drug therapy: Secondary | ICD-10-CM | POA: Diagnosis not present

## 2023-05-07 DIAGNOSIS — D485 Neoplasm of uncertain behavior of skin: Secondary | ICD-10-CM

## 2023-05-07 DIAGNOSIS — L304 Erythema intertrigo: Secondary | ICD-10-CM

## 2023-05-07 DIAGNOSIS — D492 Neoplasm of unspecified behavior of bone, soft tissue, and skin: Secondary | ICD-10-CM

## 2023-05-07 DIAGNOSIS — Z7189 Other specified counseling: Secondary | ICD-10-CM

## 2023-05-07 MED ORDER — KETOCONAZOLE 2 % EX CREA
TOPICAL_CREAM | CUTANEOUS | 11 refills | Status: DC
Start: 2023-05-07 — End: 2024-02-05

## 2023-05-07 MED ORDER — MUPIROCIN 2 % EX OINT
1.0000 | TOPICAL_OINTMENT | Freq: Every day | CUTANEOUS | 1 refills | Status: AC
Start: 2023-05-07 — End: ?

## 2023-05-07 MED ORDER — PIMECROLIMUS 1 % EX CREA
TOPICAL_CREAM | CUTANEOUS | 11 refills | Status: DC
Start: 2023-05-07 — End: 2024-02-05

## 2023-05-07 NOTE — Patient Instructions (Signed)
Wound Care Instructions  On the day following your surgery, you should begin doing daily dressing changes: Remove the old dressing and discard it. Cleanse the wound gently with tap water. This may be done in the shower or by placing a wet gauze pad directly on the wound and letting it soak for several minutes. It is important to gently remove any dried blood from the wound in order to encourage healing. This may be done by gently rolling a moistened Q-tip on the dried blood. Do not pick at the wound. If the wound should start to bleed, continue cleaning the wound, then place a moist gauze pad on the wound and hold pressure for a few minutes.  Make sure you then dry the skin surrounding the wound completely or the tape will not stick to the skin. Do not use cotton balls on the wound. After the wound is clean and dry, apply the ointment gently with a Q-tip. Cut a non-stick pad to fit the size of the wound. Lay the pad flush to the wound. If the wound is draining, you may want to reinforce it with a small amount of gauze on top of the non-stick pad for a little added compression to the area. Use the tape to seal the area completely. Select from the following with respect to your individual situation: If your wound has been stitched closed: continue the above steps 1-8 at least daily until your sutures are removed. If your wound has been left open to heal: continue steps 1-8 at least daily for the first 3-4 weeks. We would like for you to take a few extra precautions for at least the next week. Sleep with your head elevated on pillows if our wound is on your head. Do not bend over or lift heavy items to reduce the chance of elevated blood pressure to the wound Do not participate in particularly strenuous activities.   Below is a list of dressing supplies you might need.  Cotton-tipped applicators - Q-tips Gauze pads (2x2 and/or 4x4) - All-Purpose Sponges Non-stick dressing material - Telfa Tape -  Paper or Hypafix New and clean tube of petroleum jelly - Vaseline    Comments on Post-Operative Period Slight swelling and redness often appear around the wound. This is normal and will disappear within several days following the surgery. The healing wound will drain a brownish-red-yellow discharge during healing. This is a normal phase of wound healing. As the wound begins to heal, the drainage may increase in amount. Again, this drainage is normal. Notify us if the drainage becomes persistently bloody, excessively swollen, or intensely painful or develops a foul odor or red streaks.  If you should experience mild discomfort during the healing phase, you may take an aspirin-free medication such as Tylenol (acetaminophen). Notify us if the discomfort is severe or persistent. Avoid alcoholic beverages when taking pain medicine.  In Case of Wound Hemorrhage A wound hemorrhage is when the bandage suddenly becomes soaked with bright red blood and flows profusely. If this happens, sit down or lie down with your head elevated. If the wound has a dressing on it, do not remove the dressing. Apply pressure to the existing gauze. If the wound is not covered, use a gauze pad to apply pressure and continue applying the pressure for 20 minutes without peeking. DO NOT COVER THE WOUND WITH A LARGE TOWEL OR WASH CLOTH. Release your hand from the wound site but do not remove the dressing. If the bleeding has stopped,   gently clean around the wound. Leave the dressing in place for 24 hours if possible. This wait time allows the blood vessels to close off so that you do not spark a new round of bleeding by disrupting the newly clotted blood vessels with an immediate dressing change. If the bleeding does not subside, continue to hold pressure. If matters are out of your control, contact an After Hours clinic or go to the Emergency Room.   

## 2023-05-07 NOTE — Progress Notes (Signed)
Follow-Up Visit   Subjective  Johnny Banks is a 62 y.o. male who presents for the following: Excision of cysts of left scrotum He also complains of persistent itching of the groin and scrotum.  He has used prescription skin creams and feels like it is working but he is run out of the medication.  He is flared up right now.  The following portions of the chart were reviewed this encounter and updated as appropriate: medications, allergies, medical history  Review of Systems:  No other skin or systemic complaints except as noted in HPI or Assessment and Plan.  Objective  Well appearing patient in no apparent distress; mood and affect are within normal limits.  A focused examination was performed of the following areas: Scrotum  Relevant physical exam findings are noted in the Assessment and Plan.   Left Scrotum superior 1.1 cm cystic papule  Left Scrotum middle 2.2 cm cystic papule  Left Scrotum inferior 1.2 cm cystic papule   Assessment & Plan   Neoplasm of uncertain behavior of skin (3) -suspected symptomatic growing cyst scrotal cysts x 3 Left Scrotum superior  Skin excision  Lesion length (cm):  1.1 Lesion width (cm):  1.1 Margin per side (cm):  0 Total excision diameter (cm):  1.1 Informed consent: discussed and consent obtained   Timeout: patient name, date of birth, surgical site, and procedure verified   Procedure prep:  Patient was prepped and draped in usual sterile fashion Prep type:  Isopropyl alcohol and povidone-iodine Anesthesia: the lesion was anesthetized in a standard fashion   Anesthetic:  1% lidocaine w/ epinephrine 1-100,000 buffered w/ 8.4% NaHCO3 Instrument used: #15 blade   Hemostasis achieved with: pressure   Hemostasis achieved with comment:  Electrocautery Outcome: patient tolerated procedure well with no complications   Post-procedure details: sterile dressing applied and wound care instructions given   Dressing type: bandage and  pressure dressing (mupirocin)    Specimen 1 - Surgical pathology Differential Diagnosis: Cyst vs other  Check Margins: No  Left Scrotum middle  Skin excision  Lesion length (cm):  2.2 Lesion width (cm):  2.2 Margin per side (cm):  0 Total excision diameter (cm):  2.2 Informed consent: discussed and consent obtained   Timeout: patient name, date of birth, surgical site, and procedure verified   Procedure prep:  Patient was prepped and draped in usual sterile fashion Prep type:  Isopropyl alcohol and povidone-iodine Anesthesia: the lesion was anesthetized in a standard fashion   Anesthetic:  1% lidocaine w/ epinephrine 1-100,000 buffered w/ 8.4% NaHCO3 Instrument used: #15 blade   Hemostasis achieved with: pressure   Hemostasis achieved with comment:  Electrocautery Outcome: patient tolerated procedure well with no complications   Post-procedure details: sterile dressing applied and wound care instructions given   Dressing type: bandage and pressure dressing (mupirocin)    Specimen 2 - Surgical pathology Differential Diagnosis: Cyst vs other Check Margins: No  Left Scrotum inferior  Skin excision  Lesion length (cm):  1.2 Lesion width (cm):  1.2 Margin per side (cm):  0 Total excision diameter (cm):  1.2 Informed consent: discussed and consent obtained   Timeout: patient name, date of birth, surgical site, and procedure verified   Procedure prep:  Patient was prepped and draped in usual sterile fashion Prep type:  Isopropyl alcohol and povidone-iodine Anesthesia: the lesion was anesthetized in a standard fashion   Anesthetic:  1% lidocaine w/ epinephrine 1-100,000 buffered w/ 8.4% NaHCO3 Instrument used: #15 blade  Hemostasis achieved with: pressure   Hemostasis achieved with comment:  Electrocautery Outcome: patient tolerated procedure well with no complications   Post-procedure details: sterile dressing applied and wound care instructions given   Dressing type:  bandage and pressure dressing (mupirocin)    Specimen 3 - Surgical pathology Differential Diagnosis: Cyst vs other  Check Margins: No  Neoplasm of skin  Related Medications mupirocin ointment (BACTROBAN) 2 % Apply 1 Application topically daily. Qd to excision site  Intertrigo Intertrigo is a chronic recurrent rash that occurs in skin fold areas that may be associated with friction; heat; moisture; yeast; fungus; and bacteria.  It is exacerbated by increased movement / activity; sweating; and higher atmospheric temperature. Chronic and persistent condition with duration or expected duration over one year. Condition is symptomatic / bothersome to patient. Not to goal. flared  Related Medications ketoconazole (NIZORAL) 2 % cream Apply to affected skin at bedtime  pimecrolimus (ELIDEL) 1 % cream Apply to affected skin in the morning  Return in about 6 months (around 11/06/2023).  I, Joanie Coddington, CMA, am acting as scribe for Armida Sans, MD .  Documentation: I have reviewed the above documentation for accuracy and completeness, and I agree with the above.  Armida Sans, MD

## 2023-05-07 NOTE — Telephone Encounter (Signed)
Spoke with patient regarding surgery. He stated that he has bled a little bit but is fine now and not having any pain/hd

## 2023-05-08 ENCOUNTER — Telehealth: Payer: Self-pay

## 2023-05-08 ENCOUNTER — Ambulatory Visit: Payer: Medicaid Other | Admitting: Physical Therapy

## 2023-05-08 NOTE — Telephone Encounter (Signed)
Patient LVM concerned about "gaping hole" at his scrotum s/p excision of cysts yesterday. Excision was left open to heal by 2ndary intention. I did let Dr. Gwen Pounds know that the patient called. I returned patients call and advised him that the area will heal but it will take some time. He said the hole was about the size of a nickel. Advised patient to continue wound care and to let us know if not improving. Butch Penny., RMA

## 2023-05-09 ENCOUNTER — Telehealth: Payer: Self-pay

## 2023-05-09 NOTE — Telephone Encounter (Signed)
-----   Message from Deirdre Evener, MD sent at 05/09/2023  4:54 PM EDT ----- Diagnosis 1. Skin (M), left scrotum superior EPIDERMAL INCLUSION CYST 2. Skin (M), left scrotum middle EPIDERMAL INCLUSION CYST 3. Skin (M), left scrotum inferior EPIDERMAL INCLUSION CYST  1,2,3 - all three benign cyst

## 2023-05-09 NOTE — Telephone Encounter (Signed)
Advised pt of bx results 

## 2023-05-15 ENCOUNTER — Ambulatory Visit: Payer: MEDICAID | Attending: Physician Assistant

## 2023-05-15 DIAGNOSIS — R262 Difficulty in walking, not elsewhere classified: Secondary | ICD-10-CM | POA: Diagnosis present

## 2023-05-15 DIAGNOSIS — M25552 Pain in left hip: Secondary | ICD-10-CM | POA: Diagnosis present

## 2023-05-15 DIAGNOSIS — R293 Abnormal posture: Secondary | ICD-10-CM | POA: Diagnosis present

## 2023-05-15 DIAGNOSIS — M25651 Stiffness of right hip, not elsewhere classified: Secondary | ICD-10-CM | POA: Insufficient documentation

## 2023-05-15 DIAGNOSIS — M25551 Pain in right hip: Secondary | ICD-10-CM | POA: Insufficient documentation

## 2023-05-15 DIAGNOSIS — M25652 Stiffness of left hip, not elsewhere classified: Secondary | ICD-10-CM | POA: Diagnosis present

## 2023-05-15 NOTE — Therapy (Signed)
OUTPATIENT PHYSICAL THERAPY TREATMENT  Patient Name: Johnny Banks MRN: [id number] DOB:08-12-1961, 61 y.o., male Today's Date: 05/15/2023  PCP: Fleet Contras, MD REFERRING PROVIDER: Sherley Bounds, PA   END OF SESSION:   PT End of Session - 05/15/23 1000     Visit Number 9    Number of Visits 24    Date for PT Re-Evaluation 07/03/23    Authorization Type MEDICAID [place] ACCESS reporting period from 12/13/2022    Authorization Time Period [employer] auth 6/26-8/2 12 visits for 12 PT visits    Progress Note Due on Visit 10    PT Start Time 0950    PT Stop Time 1028    PT Time Calculation (min) 38 min    Equipment Utilized During Treatment Gait belt    Activity Tolerance Patient tolerated treatment well    Behavior During Therapy WFL for tasks assessed/performed                Past Medical History:  Diagnosis Date   Bipolar 1 disorder (HCC)    Hypertension    TBI (traumatic brain injury) [facility])    Past Surgical History:  Procedure Laterality Date   APPENDECTOMY     ESOPHAGOGASTRODUODENOSCOPY N/A 12/20/2016   Procedure: ESOPHAGOGASTRODUODENOSCOPY (EGD);  Surgeon: Bernette Redbird, MD;  Location: [facility];  Service: Endoscopy;  Laterality: N/A;   HIP FRACTURE SURGERY     Patient Active Problem List   Diagnosis Date Noted   Bilateral shoulder pain 01/04/2017   Physical deconditioning    Symptomatic anemia    Tobacco abuse    Melena 12/19/2016   Acute blood loss anemia 12/19/2016   ETOH abuse 12/19/2016   UGIB (upper gastrointestinal bleed) 12/19/2016   Hypotension, unspecified 04/04/2013   Sepsis (HCC) 04/02/2013   Bipolar affective disorder (HCC) 04/02/2013   Polysubstance abuse (HCC) 04/02/2013   HTN (hypertension) 04/02/2013   Syncope 04/02/2013   Acute kidney injury (HCC) 04/02/2013    REFERRING DIAG: s/p total replacement of left hip   THERAPY DIAG:  Pain in left hip  Stiffness of left hip, not elsewhere classified  Abnormal  posture  Difficulty in walking, not elsewhere classified  Pain in right hip  Stiffness of right hip, not elsewhere classified  Rationale for Evaluation and Treatment: Rehabilitation  PERTINENT HISTORY: Patient is a 62 y.o. male who presents to outpatient physical therapy with a referral for medical diagnosis s/p total replacement of left hip. This patient's chief complaints consist of left hip pain, weakness, and stiffness, difficulty weight bearing, stooped posture, difficulty with mobility S/P L THA posterior approach 09/17/2022 leading to the following functional deficits: difficulty with usual activities including household and community ambulation, transfers, and bed mobility, standing, stairs, squatting, mobility without falling, walking without walker, avoiding falls, being steady on feet, walking for exercise, walking downtown, standing up straight, unable to have shoulders replaced since he is dependent on UE weight bearing while walking, getting in and out of the car, dressing. Relevant past medical history and comorbidities include s/p R THA 06/14/2022, osteopenia in left hip, current smoker, smoker's cough, HTN, ETOH abuse, bipolar affective disorder, bipolar I disorder, polysubstance abuse, syncope, physical deconditioning, B shoulder pain (patient reports he needs both replaced), appendectomy, chronic stiffness in B hips, prior B hip pinning, B hip dysfunciton since childhood, low back pain, TBI. Patient denies hx of cancer, stroke, seizures, heart problems, diabetes, unexplained weight loss, unexplained changes in bowel or bladder problems, unexplained stumbling or dropping things, osteoporosis, and spinal surgery.  PRECAUTIONS: Falls  SUBJECTIVE:                                                                                                                                                                                      SUBJECTIVE STATEMENT:  Pt has been dealing with illness, then  dermatological procedure, but still working on walking and other HEP. His standing tolerance at home remains fairly limited. He is regretful that primary therapist is out today as he was looking forward to hearing more about her [place] vacation.   PAIN:  NPRS 0/10.     OBJECTIVE   TODAY'S TREATMENT:    *Thomas test not working out at home -standng lunge hip flexion stretch 2x30sec bilat, front foot on 2nd step (much easier) -standing alterante marching x20, BUE support  -lateral side stepping at support bar 5 round trips  -STS x12, hands free from elevated seat 23"  -lateral side stepping at support bar 4 round trips c 3lb AW bilat  -tippie toe raises x15 (also at support bar with 3lb AW)  -STS x15, hands free from elevated seat 23"  -backward walking at support bar c 3lb AW, 6x -tippie toe raises x15 (also at support bar with 3lb AW)       PATIENT EDUCATION:  Education details: Exercise purpose/form. Self management techniques. Education on diagnosis, prognosis, POC, anatomy and physiology of current condition Education on HEP including handout  Reviewed cancelation/no-show policy with patient; patient verbalized understanding (12/13/22). Person educated: Patient Education method: Explanation Education comprehension: verbalized understanding and needs further education   HOME EXERCISE PROGRAM: Access Code: [email or web address] URL: [email or web address] Date: 04/10/2023 Prepared by: Norton Blizzard  Exercises - Modified Thomas Stretch  - 1 x daily - 3 reps - 1 minute hold - Bridge on Heels  - 1 x daily - 2 sets - 10 reps - 5 seconds hold - Diagonal Hip Extension  - 1 x daily - 2 sets - 5 reps   ASSESSMENT:   CLINICAL IMPRESSION: Advancing interventions, good tolerance, pain at goal. Sitting intervals provided as needed. Patient would benefit from continued management of limiting condition by skilled physical therapist to address remaining impairments and functional  limitations to work towards stated goals and return to PLOF or maximal functional independence.   From Initial PT Eval 12/13/2022: Patient is a 62 y.o. male referred to outpatient physical therapy with a medical diagnosis of s/p total replacement of left hip who presents with signs and symptoms consistent with left hip flexion contracture, left hip weakness, generalized weakness and unsteadiness on feet keeping him from ambulating independently s/p L THA on 09/17/2022. 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 Patient had R THA performed in August 2023 but was still very limited in reaching upright standing posture due to pain and stiffness at left hip and lumbar spine stiffness. He now has much better potential for improvement following joint replacements in both hips. He is very weak from prolonged inability to stand or move in an upright posture and has remaining stiffness in L > R hip from prolonged joint dysfunction prior to surgeries. Patient appears to have excellent potential to return to ambulation with no AD but will require prolonged strengthening and rehab due to the severity of his current weakness in B hips (L>R) especially hip abduction that does not yet let him maintain SLS (MML 2/5 on left and 3+/5 on right). Patient presents with significant pain, ROM, muscle length, joint stiffness, spine posture, balance, proprioceptive, motor control, gait, muscle performance (strength/power/endurance), and activity tolerance impairments that are limiting ability to complete usual activities including household and community ambulation, transfers, and bed mobility, standing, stairs, squatting, mobility without falling, walking without walker, avoiding falls, being steady on feet, walking for exercise, walking downtown, standing up straight, unable to have shoulders replaced since he is dependent on UE weight bearing while walking, getting in and out of the car, dressing  without difficulty. Patient will benefit from skilled physical therapy intervention to address current body structure impairments and activity limitations to improve function and work towards goals set in current POC in order to return to prior level of function or maximal functional improvement.      OBJECTIVE IMPAIRMENTS: Abnormal gait, decreased activity tolerance, decreased balance, decreased coordination, decreased endurance, decreased knowledge of condition, decreased knowledge of use of DME, decreased mobility, difficulty walking, decreased ROM, decreased strength, hypomobility, increased fascial restrictions, impaired perceived functional ability, increased muscle spasms, impaired flexibility, impaired UE functional use, improper body mechanics, postural dysfunction, obesity, and pain.    ACTIVITY LIMITATIONS: carrying, lifting, bending, standing, squatting, stairs, transfers, bed mobility, bathing, toileting, dressing, reach over head, locomotion level, and caring for others   PARTICIPATION LIMITATIONS: meal prep, cleaning, laundry, interpersonal relationship, shopping, community activity, yard work, and   difficulty with usual activities including household and community ambulation, transfers, and bed mobility, standing, stairs, squatting, mobility without falling, walking without walker, avoiding falls, being steady on feet, walking for exercise, walking downtown, standing up straight, unable to have shoulders replaced since he is dependent on UE weight bearing while walking, getting in and out of the car, dressing   PERSONAL FACTORS: Fitness, Past/current experiences, Time since onset of injury/illness/exacerbation, and 3+ comorbidities:   s/p R THA 06/14/2022, osteopenia in left hip, current smoker, smoker's cough, HTN, ETOH abuse, bipolar affective disorder, bipolar I disorder, polysubstance abuse, syncope, physical deconditioning, B shoulder pain (patient reports he needs both replaced),  appendectomy, chronic stiffness in B hips, prior B hip pinning, B hip dysfunciton since childhood, low back pain, TBI are also affecting patient's functional outcome.    REHAB POTENTIAL: Excellent   CLINICAL DECISION MAKING: Evolving/moderate complexity   EVALUATION COMPLEXITY: Moderate     GOALS: Goals reviewed with patient? No   SHORT TERM GOALS: Target date: 12/27/2022   Patient will be independent with initial home exercise program for self-management of symptoms. Baseline: Initial HEP provided at IE (12/13/22); participating some (12/27/2022);  Goal status: Partially met     LONG TERM GOALS: Target date: 03/07/2023. Target date for all long term goals updated to 07/03/2023 on 04/10/2023.    Patient  will be independent with a long-term home exercise program for self-management of symptoms.  Baseline: Initial HEP provided at IE (12/13/22); participating some (12/27/2022); mostly walking with SPC (04/10/2023);  Goal status: In-progress   2.  Patient will demonstrate improved FOTO to equal or greater than 49 by visit #13 to demonstrate improvement in overall condition and self-reported functional ability.  Baseline: 36 (12/13/22); 30 at visit #3 (12/27/2022); 36 at visit #5 (02/05/2023); 52 at visit #7 (04/10/2023);  Goal status: In-progress   3.  Patient will demonstrate ability to ambulate equal or greater than 1200 feet on 6 Min Walk Test with [facility] or less restrictive assistive device to help him return to walking for health and exercise in his community.  Baseline: 600 feet with RW (12/13/22); able to ambulate 200 feet at a time with [facility] and SBA-C[facility] (12/27/2022); 434 feet with SPC and SBA (02/05/2023); 400 feet with SPC, mod I (04/10/2023);  Goal status: Ongoing   4.  Patient will complete 5 Times Sit To Stand from 18.5 inch surface or lower in equal or less than 11 seconds with no UE support to demonstrate improved fall risk and functional strength for household and community mobility and  self care.  Baseline: 19 seconds from 18.5 inch plinth with use of B UE on table, RW in front for safety.  (12/13/22); not tested (12/27/2022); 22 seconds with no UE use (02/05/23); 18 seconds with no UE use (04/10/2023);  Goal status: In-progress   5.  Patient will complete community, work and/or recreational activities with 75% less limitation due to current condition.  Baseline: difficulty with usual activities including household and community ambulation, transfers, and bed mobility, standing, stairs, squatting, mobility without falling, walking without walker, avoiding falls, being steady on feet, walking for exercise, walking downtown, standing up straight, unable to have shoulders replaced since he is dependent on UE weight bearing while walking, getting in and out of the car, dressing (12/13/22); peple have been commenting he is qucker getting up out of his chair ( 12/27/2022); improving but still far from his goal and still very impaired (02/05/2023); estimates 50-60% improvement (04/10/2023);  Goal status: In-progress   6.  Patient will demonstrate the ability to perform single leg stance without trendelenburg for equal or greater than 10 seconds on each side to demonstrate improved hip strength for normal and functional gait pattern for household and community mobility without and AD with decreased risk of falls.  Baseline: > 1 second on R, unable on left due to pain and weakness and trendelenburg sign (12/13/2022; 12/27/2022); < 1 second/unable (02/05/2023;04/10/2023);  Goal status: In-progress     PLAN:   PT FREQUENCY: 1-2x/week   PT DURATION: 12 weeks   PLANNED INTERVENTIONS: Therapeutic exercises, Therapeutic activity, Neuromuscular re-education, Balance training, Gait training, Patient/Family education, Self Care, Joint mobilization, Stair training, DME instructions, Aquatic Therapy, Dry Needling, Electrical stimulation, Spinal mobilization, Cryotherapy, Moist heat, Manual therapy, and  Re-evaluation   PLAN FOR NEXT SESSION: update HEP as appropriate, progressive LE/hip/functional strengthening with particular attention to hip abduction, interventions for improved ROM (especially hip and low back extension), balance and gait training as appropriate, manual therapy as needed.    Rosamaria Lints, PT, DPT 05/15/2023, 10:04 AM  [facility] [address] [place], [place] [address] P: [phone] I F: [phone]

## 2023-05-22 ENCOUNTER — Ambulatory Visit: Payer: MEDICAID | Admitting: Physical Therapy

## 2023-05-29 ENCOUNTER — Ambulatory Visit: Payer: MEDICAID | Admitting: Physical Therapy

## 2023-05-29 ENCOUNTER — Telehealth: Payer: Self-pay | Admitting: Physical Therapy

## 2023-05-29 NOTE — Telephone Encounter (Signed)
Called patient back after support staff informed this PT that patient called and said he missed his PT appointment scheduled at 9:45 this morning because his ride never showed up. Patient accepted a new appointment on 06/10/23 at 9am.   Huntley Dec R. Ilsa Iha, PT, DPT 05/29/23, 10:05 AM  [facility] [address] [place], [place] [address] P: [phone] I F: [phone]

## 2023-06-05 ENCOUNTER — Encounter: Payer: Self-pay | Admitting: Physical Therapy

## 2023-06-05 ENCOUNTER — Ambulatory Visit: Payer: MEDICAID | Admitting: Physical Therapy

## 2023-06-05 VITALS — BP 146/83 | HR 80

## 2023-06-05 DIAGNOSIS — M25552 Pain in left hip: Secondary | ICD-10-CM | POA: Diagnosis not present

## 2023-06-05 DIAGNOSIS — M25652 Stiffness of left hip, not elsewhere classified: Secondary | ICD-10-CM

## 2023-06-05 DIAGNOSIS — R262 Difficulty in walking, not elsewhere classified: Secondary | ICD-10-CM

## 2023-06-05 DIAGNOSIS — R293 Abnormal posture: Secondary | ICD-10-CM

## 2023-06-05 NOTE — Therapy (Signed)
OUTPATIENT PHYSICAL THERAPY TREATMENT / PROGRESS NOTE / RE-CERTIFICATION Dates of reporting from 12/13/2022 to 06/05/2023  Patient Name: Johnny Banks MRN: [id number] DOB:August 31, 1961, 62 y.o., male Today's Date: 06/05/2023  PCP: Fleet Contras, MD REFERRING PROVIDER: Sherley Bounds, PA   END OF SESSION:   PT End of Session - 06/05/23 0946     Visit Number 10    Number of Visits 24    Date for PT Re-Evaluation 08/28/23    Authorization Type MEDICAID [place] ACCESS reporting period from 12/13/2022    Authorization Time Period [employer] auth 6/26-8/2 12 visits for 12 PT visits    Authorization - Visit Number 2    Authorization - Number of Visits 12    Progress Note Due on Visit 10    PT Start Time 0945    PT Stop Time 1025    PT Time Calculation (min) 40 min    Equipment Utilized During Treatment Gait belt    Activity Tolerance Patient tolerated treatment well    Behavior During Therapy WFL for tasks assessed/performed                 Past Medical History:  Diagnosis Date   Bipolar 1 disorder (HCC)    Hypertension    TBI (traumatic brain injury) [facility])    Past Surgical History:  Procedure Laterality Date   APPENDECTOMY     ESOPHAGOGASTRODUODENOSCOPY N/A 12/20/2016   Procedure: ESOPHAGOGASTRODUODENOSCOPY (EGD);  Surgeon: Bernette Redbird, MD;  Location: [facility];  Service: Endoscopy;  Laterality: N/A;   HIP FRACTURE SURGERY     Patient Active Problem List   Diagnosis Date Noted   Bilateral shoulder pain 01/04/2017   Physical deconditioning    Symptomatic anemia    Tobacco abuse    Melena 12/19/2016   Acute blood loss anemia 12/19/2016   ETOH abuse 12/19/2016   UGIB (upper gastrointestinal bleed) 12/19/2016   Hypotension, unspecified 04/04/2013   Sepsis (HCC) 04/02/2013   Bipolar affective disorder (HCC) 04/02/2013   Polysubstance abuse (HCC) 04/02/2013   HTN (hypertension) 04/02/2013   Syncope 04/02/2013   Acute kidney injury (HCC) 04/02/2013    REFERRING  DIAG: s/p total replacement of left hip   THERAPY DIAG:  Pain in left hip  Stiffness of left hip, not elsewhere classified  Abnormal posture  Difficulty in walking, not elsewhere classified  Rationale for Evaluation and Treatment: Rehabilitation  PERTINENT HISTORY: Patient is a 62 y.o. male who presents to outpatient physical therapy with a referral for medical diagnosis s/p total replacement of left hip. This patient's chief complaints consist of left hip pain, weakness, and stiffness, difficulty weight bearing, stooped posture, difficulty with mobility S/P L THA posterior approach 09/17/2022 leading to the following functional deficits: difficulty with usual activities including household and community ambulation, transfers, and bed mobility, standing, stairs, squatting, mobility without falling, walking without walker, avoiding falls, being steady on feet, walking for exercise, walking downtown, standing up straight, unable to have shoulders replaced since he is dependent on UE weight bearing while walking, getting in and out of the car, dressing. Relevant past medical history and comorbidities include s/p R THA 06/14/2022, osteopenia in left hip, current smoker, smoker's cough, HTN, ETOH abuse, bipolar affective disorder, bipolar I disorder, polysubstance abuse, syncope, physical deconditioning, B shoulder pain (patient reports he needs both replaced), appendectomy, chronic stiffness in B hips, prior B hip pinning, B hip dysfunciton since childhood, low back pain, TBI. Patient denies hx of cancer, stroke, seizures, heart problems, diabetes, unexplained weight  loss, unexplained changes in bowel or bladder problems, unexplained stumbling or dropping things, osteoporosis, and spinal surgery.    PRECAUTIONS: Falls  SUBJECTIVE:                                                                                                                                                                                       SUBJECTIVE STATEMENT:  Patient   PAIN:  NPRS 3/10 low back pain, 2-3/10 "very minimal" at left hip when walking   OBJECTIVE   Vitals:   06/05/23 0951  BP: (!) 146/83  Pulse: 80  SpO2: 97%   SELF-REPORTED FUNCTION FOTO score: 49/100 (hip questionnaire)   FUNCTIONAL/BALANCE 6 Minute Walk Test: 778 feet with SPC in R UE, increased left hip pain, then increased back pain and out of breath.    5 Times Sit To Stand Test: 17 seconds with no UE use from 18.5 inch plinth.    L Single Leg Stance: < 1 second/unable, trendelenburg present R Single Leg Stance: 2 seconds, no trendelenburg    TODAY'S TREATMENT:    Therapeutic exercise: to centralize symptoms and improve ROM, strength, muscular endurance, and activity tolerance required for successful completion of functional activities.  - vitals check to assess safety (see above) - 6 Minute Walk Test, 5 Times Sit To Stand Test, Single Leg Stance Balance test, to assess progress (see above).  - standing lunge hip flexion stretch on stairs 3x30sec bilat, front foot on 2nd step - lateral stepping back and forth over 6 inch aerobic step with B UE support as needed. 1x10 each direction.   Pt required multimodal cuing for proper technique and to facilitate improved neuromuscular control, strength, range of motion, and functional ability resulting in improved performance and form.   PATIENT EDUCATION:  Education details: Exercise purpose/form. Self management techniques. Education on diagnosis, prognosis, POC, anatomy and physiology of current condition Education on HEP including handout  Reviewed cancelation/no-show policy with patient; patient verbalized understanding (12/13/22). Person educated: Patient Education method: Explanation Education comprehension: verbalized understanding and needs further education   HOME EXERCISE PROGRAM: Access Code: [email or web address] URL: [email or web address] Date: 04/10/2023 Prepared by: Norton Blizzard  Exercises - Modified Thomas Stretch  - 1 x daily - 3 reps - 1 minute hold - Bridge on Heels  - 1 x daily - 2 sets - 10 reps - 5 seconds hold - Diagonal Hip Extension  - 1 x daily - 2 sets - 5 reps   ASSESSMENT:   CLINICAL IMPRESSION: Patient has attended 10 physical therapy sessions since starting current episode of care on 12/13/2022. Patient has had a lot of trouble with attendance related to inconsistent transportation  and illness. Patient continues to show progress in functional mobility and towards goals, but has not yet maximized his rehab potential or met most of his physical therapy and personal goals. Continued PT is medically necessary to improve functional mobility and independence and to prepare patient to be able to undergo needed surgeries for his shoulders. Plan to continue with PT at a rate of 1-2x/week for the next 12 weeks. Patient would benefit from continued management of limiting condition by skilled physical therapist to address remaining impairments and functional limitations to work towards stated goals and return to PLOF or maximal functional independence.   From Initial PT Eval 12/13/2022: Patient is a 62 y.o. male referred to outpatient physical therapy with a medical diagnosis of s/p total replacement of left hip who presents with signs and symptoms consistent with left hip flexion contracture, left hip weakness, generalized weakness and unsteadiness on feet keeping him from ambulating independently s/p L THA on 09/17/2022. 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 Patient had R THA performed in August 2023 but was still very limited in reaching upright standing posture due to pain and stiffness at left hip and lumbar spine stiffness. He now has much better potential for improvement following joint replacements in both hips. He is very weak from prolonged inability to stand or move in an upright posture and has remaining stiffness  in L > R hip from prolonged joint dysfunction prior to surgeries. Patient appears to have excellent potential to return to ambulation with no AD but will require prolonged strengthening and rehab due to the severity of his current weakness in B hips (L>R) especially hip abduction that does not yet let him maintain SLS (MML 2/5 on left and 3+/5 on right). Patient presents with significant pain, ROM, muscle length, joint stiffness, spine posture, balance, proprioceptive, motor control, gait, muscle performance (strength/power/endurance), and activity tolerance impairments that are limiting ability to complete usual activities including household and community ambulation, transfers, and bed mobility, standing, stairs, squatting, mobility without falling, walking without walker, avoiding falls, being steady on feet, walking for exercise, walking downtown, standing up straight, unable to have shoulders replaced since he is dependent on UE weight bearing while walking, getting in and out of the car, dressing without difficulty. Patient will benefit from skilled physical therapy intervention to address current body structure impairments and activity limitations to improve function and work towards goals set in current POC in order to return to prior level of function or maximal functional improvement.      OBJECTIVE IMPAIRMENTS: Abnormal gait, decreased activity tolerance, decreased balance, decreased coordination, decreased endurance, decreased knowledge of condition, decreased knowledge of use of DME, decreased mobility, difficulty walking, decreased ROM, decreased strength, hypomobility, increased fascial restrictions, impaired perceived functional ability, increased muscle spasms, impaired flexibility, impaired UE functional use, improper body mechanics, postural dysfunction, obesity, and pain.    ACTIVITY LIMITATIONS: carrying, lifting, bending, standing, squatting, stairs, transfers, bed mobility, bathing,  toileting, dressing, reach over head, locomotion level, and caring for others   PARTICIPATION LIMITATIONS: meal prep, cleaning, laundry, interpersonal relationship, shopping, community activity, yard work, and   difficulty with usual activities including household and community ambulation, transfers, and bed mobility, standing, stairs, squatting, mobility without falling, walking without walker, avoiding falls, being steady on feet, walking for exercise, walking downtown, standing up straight, unable to have shoulders replaced since he is dependent on UE weight bearing while walking, getting in and out of  the car, dressing   PERSONAL FACTORS: Fitness, Past/current experiences, Time since onset of injury/illness/exacerbation, and 3+ comorbidities:   s/p R THA 06/14/2022, 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    REHAB POTENTIAL: Excellent   CLINICAL DECISION MAKING: Evolving/moderate complexity   EVALUATION COMPLEXITY: Moderate     GOALS: Goals reviewed with patient? No   SHORT TERM GOALS: Target date: 12/27/2022   Patient will be independent with initial home exercise program for self-management of symptoms. Baseline: Initial HEP provided at IE (12/13/22); participating some (12/27/2022);  Goal status: Partially met     LONG TERM GOALS: Target date: 03/07/2023. Target date for all long term goals updated to 07/03/2023 on 04/10/2023. Target date for all unmet long term goals updated to 08/28/2023.    Patient will be independent with a long-term home exercise program for self-management of symptoms.  Baseline: Initial HEP provided at IE  (12/13/22); participating some (12/27/2022); mostly walking with [facility] (04/10/2023); mostly walking with SPC, some of the HEP from handouts, balancing when going out for a smoke (06/05/2023);  Goal status: In-progress   2.  Patient will demonstrate improved FOTO to equal or greater than 49 by visit #13 to demonstrate improvement in overall condition and self-reported functional ability.  Baseline: 36 (12/13/22); 30 at visit #3 (12/27/2022); 36 at visit #5 (02/05/2023); 52 at visit #7 (04/10/2023);  47 at visit #10 (06/05/2023);  Goal status: In-progress   3.  Patient will demonstrate ability to ambulate equal or greater than 1200 feet on 6 Min Walk Test with [facility] or less restrictive assistive device to help him return to walking for health and exercise in his community.  Baseline: 600 feet with RW (12/13/22); able to ambulate 200 feet at a time with [facility] and SBA-CGA (12/27/2022); 434 feet with SPC and SBA (02/05/2023); 400 feet with SPC, mod I (04/10/2023); 778 feet with SPC, mod I (06/05/2023);  Goal status: Ongoing   4.  Patient will complete 5 Times Sit To Stand from 18.5 inch surface or lower in equal or less than 11 seconds with no UE support to demonstrate improved fall risk and functional strength for household and community mobility and self care.  Baseline: 19 seconds from 18.5 inch plinth with use of B UE on table, RW in front for safety.  (12/13/22); not tested (12/27/2022); 22 seconds with no UE use (02/05/23); 18 seconds with no UE use (04/10/2023); 17 seconds with no UE use (06/05/2023);  Goal status: In-progress   5.  Patient will complete community, work and/or recreational activities with 75% less limitation due to current condition.  Baseline: difficulty with usual activities including household and community ambulation, transfers, and bed mobility, standing, stairs, squatting, mobility without falling, walking without walker, avoiding falls, being steady on feet, walking for exercise, walking  downtown, standing up straight, unable to have shoulders replaced since he is dependent on UE weight bearing while walking, getting in and out of the car, dressing (12/13/22); peple have been commenting he is qucker getting up out of his chair ( 12/27/2022); improving but still far from his goal and still very impaired (02/05/2023); estimates 50-60% improvement (04/10/2023); estimates 90% improvement (06/05/2023);  Goal status: MET   6.  Patient  will demonstrate the ability to perform single leg stance without trendelenburg for equal or greater than 10 seconds on each side to demonstrate improved hip strength for normal and functional gait pattern for household and community mobility without and AD with decreased risk of falls.  Baseline: > 1 second on R, unable on left due to pain and weakness and trendelenburg sign (12/13/2022; 12/27/2022); < 1 second/unable (02/05/2023;04/10/2023; 06/05/2023);  Goal status: In-progress     PLAN:   PT FREQUENCY: 1-2x/week   PT DURATION: 12 weeks   PLANNED INTERVENTIONS: Therapeutic exercises, Therapeutic activity, Neuromuscular re-education, Balance training, Gait training, Patient/Family education, Self Care, Joint mobilization, Stair training, DME instructions, Aquatic Therapy, Dry Needling, Electrical stimulation, Spinal mobilization, Cryotherapy, Moist heat, Manual therapy, and Re-evaluation   PLAN FOR NEXT SESSION: update HEP as appropriate, progressive LE/hip/functional strengthening with particular attention to hip abduction, interventions for improved ROM (especially hip and low back extension), balance and gait training as appropriate, manual therapy as needed.    Cira Rue, PT, DPT 06/05/2023, 10:29 AM  [facility] [address] [place], [place] [address] P: [phone] I F: [phone]

## 2023-06-10 ENCOUNTER — Ambulatory Visit: Payer: MEDICAID | Admitting: Physical Therapy

## 2023-06-25 ENCOUNTER — Ambulatory Visit: Payer: MEDICAID | Admitting: Physical Therapy

## 2023-07-02 ENCOUNTER — Ambulatory Visit: Payer: MEDICAID | Attending: Physician Assistant | Admitting: Physical Therapy

## 2023-07-02 ENCOUNTER — Encounter: Payer: Self-pay | Admitting: Physical Therapy

## 2023-07-02 DIAGNOSIS — M25652 Stiffness of left hip, not elsewhere classified: Secondary | ICD-10-CM | POA: Insufficient documentation

## 2023-07-02 DIAGNOSIS — M25552 Pain in left hip: Secondary | ICD-10-CM | POA: Diagnosis not present

## 2023-07-02 DIAGNOSIS — R293 Abnormal posture: Secondary | ICD-10-CM | POA: Insufficient documentation

## 2023-07-02 DIAGNOSIS — R262 Difficulty in walking, not elsewhere classified: Secondary | ICD-10-CM | POA: Insufficient documentation

## 2023-07-02 NOTE — Therapy (Signed)
OUTPATIENT PHYSICAL THERAPY TREATMENT  Patient Name: Johnny Banks MRN: [id number] DOB:[birth year], 62 y.o., male Today's Date: 07/02/2023  PCP: Fleet Contras, MD REFERRING PROVIDER: Sherley Bounds, PA   END OF SESSION:   PT End of Session - 07/02/23 0950     Visit Number 11    Number of Visits 24    Date for PT Re-Evaluation 08/28/23    Authorization Type [employer] reporting period from 06/05/2023    Authorization Time Period Netta Corrigan [id number] 8/7-10/16 for 24 PT vistis    Authorization - Visit Number 1    Authorization - Number of Visits 24    Progress Note Due on Visit 20    PT Start Time 0949    PT Stop Time 1027    PT Time Calculation (min) 38 min    Equipment Utilized During Treatment Gait belt    Activity Tolerance Patient tolerated treatment well    Behavior During Therapy WFL for tasks assessed/performed                  Past Medical History:  Diagnosis Date   Bipolar 1 disorder (HCC)    Hypertension    TBI (traumatic brain injury) [facility])    Past Surgical History:  Procedure Laterality Date   APPENDECTOMY     ESOPHAGOGASTRODUODENOSCOPY N/A 12/20/2016   Procedure: ESOPHAGOGASTRODUODENOSCOPY (EGD);  Surgeon: Bernette Redbird, MD;  Location: [facility];  Service: Endoscopy;  Laterality: N/A;   HIP FRACTURE SURGERY     Patient Active Problem List   Diagnosis Date Noted   Bilateral shoulder pain 01/04/2017   Physical deconditioning    Symptomatic anemia    Tobacco abuse    Melena 12/19/2016   Acute blood loss anemia 12/19/2016   ETOH abuse 12/19/2016   UGIB (upper gastrointestinal bleed) 12/19/2016   Hypotension, unspecified 04/04/2013   Sepsis (HCC) 04/02/2013   Bipolar affective disorder (HCC) 04/02/2013   Polysubstance abuse (HCC) 04/02/2013   HTN (hypertension) 04/02/2013   Syncope 04/02/2013   Acute kidney injury (HCC) 04/02/2013    REFERRING DIAG: s/p total replacement of left hip   THERAPY DIAG:  Pain in  left hip  Stiffness of left hip, not elsewhere classified  Abnormal posture  Difficulty in walking, not elsewhere classified  Rationale for Evaluation and Treatment: Rehabilitation  PERTINENT HISTORY: Patient is a 62 y.o. male who presents to outpatient physical therapy with a referral for medical diagnosis s/p total replacement of left hip. This patient's chief complaints consist of left hip pain, weakness, and stiffness, difficulty weight bearing, stooped posture, difficulty with mobility S/P L THA posterior approach 09/17/2022 leading to the following functional deficits: difficulty with usual activities including household and community ambulation, transfers, and bed mobility, standing, stairs, squatting, mobility without falling, walking without walker, avoiding falls, being steady on feet, walking for exercise, walking downtown, standing up straight, unable to have shoulders replaced since he is dependent on UE weight bearing while walking, getting in and out of the car, dressing. Relevant past medical history and comorbidities include s/p R THA 06/14/2022, osteopenia in left hip, current smoker, smoker's cough, HTN, ETOH abuse, bipolar affective disorder, bipolar I disorder, polysubstance abuse, syncope, physical deconditioning, B shoulder pain (patient reports he needs both replaced), appendectomy, chronic stiffness in B hips, prior B hip pinning, B hip dysfunciton since childhood, low back pain, TBI. Patient denies hx of cancer, stroke, seizures, heart problems, diabetes, unexplained weight loss, unexplained changes in bowel or bladder problems, unexplained stumbling or dropping things,  osteoporosis, and spinal surgery.    PRECAUTIONS: Falls  SUBJECTIVE:                                                                                                                                                                                      SUBJECTIVE STATEMENT:  Patient states he has been working a lot  on standing up without using his hands and walking without using his cane.   PAIN:  NPRS 4-5/10 low back pain, 4/10 left hip   OBJECTIVE  TODAY'S TREATMENT:    Therapeutic exercise: to centralize symptoms and improve ROM, strength, muscular endurance, and activity tolerance required for successful completion of functional activities.  - ambulation over ground with SPC in R LE for distance in 6 minutes. 3 standing rests due to low back pain and dyspnea. Able to ambulate with no SPC for about 15 feet with L compensated trendelenburg but more stable than before.  - staggered stance (R foot forward) sit <> stand from 17 inch chair with no UE support. 3x10. (2nd and 3rd sets with 2#DB held at shoulder height). RPE 8 or above.  - seated overhead press AROM 1x7 (painful eccentric phase with L UE so discontinued - pt tried with 2# weights for 2 reps despite PT advising not to).  - standing lateral stepping back and forth over 6 inch small aerobic step with B UE support, wearing 5#AW on each ankle, 3x10 each direction with seated rest between.  - sled push, 1x4x30 feet with 90# added to 75# sled. Using vertical poles. PT turning the sled with assistance as needed.   Pt required multimodal cuing for proper technique and to facilitate improved neuromuscular control, strength, range of motion, and functional ability resulting in improved performance and form.   PATIENT EDUCATION:  Education details: Exercise purpose/form. Self management techniques. Education on diagnosis, prognosis, POC, anatomy and physiology of current condition Education on HEP including handout  Reviewed cancelation/no-show policy with patient; patient verbalized understanding (12/13/22). Person educated: Patient Education method: Explanation Education comprehension: verbalized understanding and needs further education   HOME EXERCISE PROGRAM: Access Code: [email or web address] URL: [email or web address] Date:  07/02/2023 Prepared by: Norton Blizzard  Exercises - Modified Thomas Stretch  - 1 x daily - 3 reps - 1 minute hold - Bridge on Heels  - 1 x daily - 2 sets - 10 reps - 5 seconds hold - Diagonal Hip Extension  - 1 x daily - 2 sets - 5 reps - Staggered Sit-to-Stand  - 3-5 x weekly - 3 sets - 10 reps    ASSESSMENT:   CLINICAL IMPRESSION: Patient returns to PT nearly 4 weeks after last PT session  due to transportation and inability to take a shower due to other residents at his group home pooping everywhere so he could not use the facilities. Patient demonstrates improved ambulatory stability but still needs SPC due to significant left lateral hip weakness. Today's session continued to focus on improving hip and functional strength to help patient regain functional ability. Patient tolerated session with some difficulty due to quick fatigue, shortness of breath, and back pain. He continues to have good rehab potential that would be best maximized by regular PT attendance. Patient would benefit from continued management of limiting condition by skilled physical therapist to address remaining impairments and functional limitations to work towards stated goals and return to PLOF or maximal functional independence.   From Initial PT Eval 12/13/2022: Patient is a 62 y.o. male referred to outpatient physical therapy with a medical diagnosis of s/p total replacement of left hip who presents with signs and symptoms consistent with left hip flexion contracture, left hip weakness, generalized weakness and unsteadiness on feet keeping him from ambulating independently s/p L THA on 09/17/2022. 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 Patient had R THA performed in August 2023 but was still very limited in reaching upright standing posture due to pain and stiffness at left hip and lumbar spine stiffness. He now has much better potential for improvement following joint replacements  in both hips. He is very weak from prolonged inability to stand or move in an upright posture and has remaining stiffness in L > R hip from prolonged joint dysfunction prior to surgeries. Patient appears to have excellent potential to return to ambulation with no AD but will require prolonged strengthening and rehab due to the severity of his current weakness in B hips (L>R) especially hip abduction that does not yet let him maintain SLS (MML 2/5 on left and 3+/5 on right). Patient presents with significant pain, ROM, muscle length, joint stiffness, spine posture, balance, proprioceptive, motor control, gait, muscle performance (strength/power/endurance), and activity tolerance impairments that are limiting ability to complete usual activities including household and community ambulation, transfers, and bed mobility, standing, stairs, squatting, mobility without falling, walking without walker, avoiding falls, being steady on feet, walking for exercise, walking downtown, standing up straight, unable to have shoulders replaced since he is dependent on UE weight bearing while walking, getting in and out of the car, dressing without difficulty. Patient will benefit from skilled physical therapy intervention to address current body structure impairments and activity limitations to improve function and work towards goals set in current POC in order to return to prior level of function or maximal functional improvement.      OBJECTIVE IMPAIRMENTS: Abnormal gait, decreased activity tolerance, decreased balance, decreased coordination, decreased endurance, decreased knowledge of condition, decreased knowledge of use of DME, decreased mobility, difficulty walking, decreased ROM, decreased strength, hypomobility, increased fascial restrictions, impaired perceived functional ability, increased muscle spasms, impaired flexibility, impaired UE functional use, improper body mechanics, postural dysfunction, obesity, and pain.     ACTIVITY LIMITATIONS: carrying, lifting, bending, standing, squatting, stairs, transfers, bed mobility, bathing, toileting, dressing, reach over head, locomotion level, and caring for others   PARTICIPATION LIMITATIONS: meal prep, cleaning, laundry, interpersonal relationship, shopping, community activity, yard work, and   difficulty with usual activities including household and community ambulation, transfers, and bed mobility, standing, stairs, squatting, mobility without falling, walking without walker, avoiding falls, being steady on feet, walking for exercise, walking downtown, standing up straight,  unable to have shoulders replaced since he is dependent on UE weight bearing while walking, getting in and out of the car, dressing   PERSONAL FACTORS: Fitness, Past/current experiences, Time since onset of injury/illness/exacerbation, and 3+ comorbidities:   s/p R THA 06/14/2022, 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    REHAB POTENTIAL: Excellent   CLINICAL DECISION MAKING: Evolving/moderate complexity   EVALUATION COMPLEXITY: Moderate     GOALS: Goals reviewed with patient? No   SHORT TERM GOALS: Target date: 12/27/2022   Patient will be independent with initial home exercise program for self-management of symptoms. Baseline: Initial HEP provided at IE (12/13/22); participating some (12/27/2022);  Goal status: Partially met     LONG TERM GOALS: Target date: 03/07/2023. Target date for all long term goals updated to 07/03/2023 on 04/10/2023. Target date for all unmet long term goals updated to 08/28/2023.    Patient will be independent  with a long-term home exercise program for self-management of symptoms.  Baseline: Initial HEP provided at IE (12/13/22); participating some (12/27/2022); mostly walking with [facility] (04/10/2023); mostly walking with SPC, some of the HEP from handouts, balancing when going out for a smoke (06/05/2023);  Goal status: In-progress   2.  Patient will demonstrate improved FOTO to equal or greater than 49 by visit #13 to demonstrate improvement in overall condition and self-reported functional ability.  Baseline: 36 (12/13/22); 30 at visit #3 (12/27/2022); 36 at visit #5 (02/05/2023); 52 at visit #7 (04/10/2023);  47 at visit #10 (06/05/2023);  Goal status: In-progress   3.  Patient will demonstrate ability to ambulate equal or greater than 1200 feet on 6 Min Walk Test with [facility] or less restrictive assistive device to help him return to walking for health and exercise in his community.  Baseline: 600 feet with RW (12/13/22); able to ambulate 200 feet at a time with [facility] and SBA-C[facility] (12/27/2022); 434 feet with SPC and SBA (02/05/2023); 400 feet with SPC, mod I (04/10/2023); 778 feet with SPC, mod I (06/05/2023);  Goal status: Ongoing   4.  Patient will complete 5 Times Sit To Stand from 18.5 inch surface or lower in equal or less than 11 seconds with no UE support to demonstrate improved fall risk and functional strength for household and community mobility and self care.  Baseline: 19 seconds from 18.5 inch plinth with use of B UE on table, RW in front for safety.  (12/13/22); not tested (12/27/2022); 22 seconds with no UE use (02/05/23); 18 seconds with no UE use (04/10/2023); 17 seconds with no UE use (06/05/2023);  Goal status: In-progress   5.  Patient will complete community, work and/or recreational activities with 75% less limitation due to current condition.  Baseline: difficulty with usual activities including household and community ambulation, transfers, and bed mobility, standing, stairs, squatting, mobility  without falling, walking without walker, avoiding falls, being steady on feet, walking for exercise, walking downtown, standing up straight, unable to have shoulders replaced since he is dependent on UE weight bearing while walking, getting in and out of the car, dressing (12/13/22); peple have been commenting he is qucker getting up out of his chair ( 12/27/2022); improving but still far from his goal and still  very impaired (02/05/2023); estimates 50-60% improvement (04/10/2023); estimates 90% improvement (06/05/2023);  Goal status: MET   6.  Patient will demonstrate the ability to perform single leg stance without trendelenburg for equal or greater than 10 seconds on each side to demonstrate improved hip strength for normal and functional gait pattern for household and community mobility without and AD with decreased risk of falls.  Baseline: > 1 second on R, unable on left due to pain and weakness and trendelenburg sign (12/13/2022; 12/27/2022); < 1 second/unable (02/05/2023;04/10/2023; 06/05/2023);  Goal status: In-progress     PLAN:   PT FREQUENCY: 1-2x/week   PT DURATION: 12 weeks   PLANNED INTERVENTIONS: Therapeutic exercises, Therapeutic activity, Neuromuscular re-education, Balance training, Gait training, Patient/Family education, Self Care, Joint mobilization, Stair training, DME instructions, Aquatic Therapy, Dry Needling, Electrical stimulation, Spinal mobilization, Cryotherapy, Moist heat, Manual therapy, and Re-evaluation   PLAN FOR NEXT SESSION: update HEP as appropriate, progressive LE/hip/functional strengthening with particular attention to hip abduction, interventions for improved ROM (especially hip and low back extension), balance and gait training as appropriate, manual therapy as needed.    Cira Rue, PT, DPT 07/02/2023, 10:30 AM  [facility] [address] [place], [place] [address] P: [phone] I F: [phone]

## 2023-07-09 ENCOUNTER — Ambulatory Visit: Payer: MEDICAID | Admitting: Physical Therapy

## 2023-07-15 NOTE — Therapy (Signed)
OUTPATIENT PHYSICAL THERAPY TREATMENT  Patient Name: Johnny Banks MRN: [id number] DOB:[birth year], 62 y.o., male Today's Date: 07/16/2023  PCP: Fleet Contras, MD REFERRING PROVIDER: Sherley Bounds, PA   END OF SESSION:   PT End of Session - 07/16/23 0856     Visit Number 12    Number of Visits 24    Date for PT Re-Evaluation 08/28/23    Authorization Type [employer] reporting period from 06/05/2023    Authorization Time Period Netta Corrigan [id number] 8/7-10/16 for 24 PT vistis    Authorization - Number of Visits 24    Progress Note Due on Visit 20    PT Start Time 0858    PT Stop Time 0940    PT Time Calculation (min) 42 min    Equipment Utilized During Treatment Gait belt    Activity Tolerance Patient tolerated treatment well    Behavior During Therapy WFL for tasks assessed/performed             Past Medical History:  Diagnosis Date   Bipolar 1 disorder (HCC)    Hypertension    TBI (traumatic brain injury) [facility])    Past Surgical History:  Procedure Laterality Date   APPENDECTOMY     ESOPHAGOGASTRODUODENOSCOPY N/A 12/20/2016   Procedure: ESOPHAGOGASTRODUODENOSCOPY (EGD);  Surgeon: Bernette Redbird, MD;  Location: [facility];  Service: Endoscopy;  Laterality: N/A;   HIP FRACTURE SURGERY     Patient Active Problem List   Diagnosis Date Noted   Bilateral shoulder pain 01/04/2017   Physical deconditioning    Symptomatic anemia    Tobacco abuse    Melena 12/19/2016   Acute blood loss anemia 12/19/2016   ETOH abuse 12/19/2016   UGIB (upper gastrointestinal bleed) 12/19/2016   Hypotension, unspecified 04/04/2013   Sepsis (HCC) 04/02/2013   Bipolar affective disorder (HCC) 04/02/2013   Polysubstance abuse (HCC) 04/02/2013   HTN (hypertension) 04/02/2013   Syncope 04/02/2013   Acute kidney injury (HCC) 04/02/2013    REFERRING DIAG: s/p total replacement of left hip   THERAPY DIAG:  Pain in left hip  Stiffness of left hip, not elsewhere  classified  Abnormal posture  Difficulty in walking, not elsewhere classified  Rationale for Evaluation and Treatment: Rehabilitation  PERTINENT HISTORY: Patient is a 62 y.o. male who presents to outpatient physical therapy with a referral for medical diagnosis s/p total replacement of left hip. This patient's chief complaints consist of left hip pain, weakness, and stiffness, difficulty weight bearing, stooped posture, difficulty with mobility S/P L THA posterior approach 09/17/2022 leading to the following functional deficits: difficulty with usual activities including household and community ambulation, transfers, and bed mobility, standing, stairs, squatting, mobility without falling, walking without Arrie Borrelli, avoiding falls, being steady on feet, walking for exercise, walking downtown, standing up straight, unable to have shoulders replaced since he is dependent on UE weight bearing while walking, getting in and out of the car, dressing. Relevant past medical history and comorbidities include s/p R THA 06/14/2022, osteopenia in left hip, current smoker, smoker's cough, HTN, ETOH abuse, bipolar affective disorder, bipolar I disorder, polysubstance abuse, syncope, physical deconditioning, B shoulder pain (patient reports he needs both replaced), appendectomy, chronic stiffness in B hips, prior B hip pinning, B hip dysfunciton since childhood, low back pain, TBI. Patient denies hx of cancer, stroke, seizures, heart problems, diabetes, unexplained weight loss, unexplained changes in bowel or bladder problems, unexplained stumbling or dropping things, osteoporosis, and spinal surgery.    PRECAUTIONS: Falls  SUBJECTIVE:  SUBJECTIVE STATEMENT: Patient reports he missed last week due to stomach virus. Patient reports feeling good  this date with no pain on arrival.      PAIN:  NPRS 4-5/10 low back pain, 4/10 left hip   OBJECTIVE  TODAY'S TREATMENT:    Therapeutic exercise: to centralize symptoms and improve ROM, strength, muscular endurance, and activity tolerance required for successful completion of functional activities.  - ambulation over ground with SPC in R LE for distance in 3 minutes. Able to ambulate with no SPC for about 50 feet with L compensated trendelenburg but more stable than before. Required seated rest break due to back pain.  - staggered stance (R foot forward) sit <> stand from 17 inch chair with no UE support. 3x10. (All sets with 2#DB chest press). RPE 8 or above.  -Seated LAQ & marching 5# AW 3 x 10 each LE and exercise  -Standing step taps with 5# AW 2 x 10 each LE, with hands resting on stair railing    Pt required multimodal cuing for proper technique and to facilitate improved neuromuscular control, strength, range of motion, and functional ability resulting in improved performance and form.   PATIENT EDUCATION:  Education details: Exercise purpose/form. Self management techniques. Education on diagnosis, prognosis, POC, anatomy and physiology of current condition Education on HEP including handout  Reviewed cancelation/no-show policy with patient; patient verbalized understanding (12/13/22). Person educated: Patient Education method: Explanation Education comprehension: verbalized understanding and needs further education   HOME EXERCISE PROGRAM: Access Code: [email or web address] URL: [email or web address] Date: 07/02/2023 Prepared by: Norton Blizzard  Exercises - Modified Thomas Stretch  - 1 x daily - 3 reps - 1 minute hold - Bridge on Heels  - 1 x daily - 2 sets - 10 reps - 5 seconds hold - Diagonal Hip Extension  - 1 x daily - 2 sets - 5 reps - Staggered Sit-to-Stand  - 3-5 x weekly - 3 sets - 10 reps    ASSESSMENT:   CLINICAL IMPRESSION:    Patient arrives to treatment  session motivated and reporting no pain. Session focused on LE strengthening and ambulation with/without SPC. Gait limited with no AD due to low back pain. Continues to be limited by weakness, fatigue, and back pain. Patient would benefit from continued management of limiting condition by skilled physical therapist to address remaining impairments and functional limitations to work towards stated goals and return to PLOF or maximal functional independence.   From Initial PT Eval 12/13/2022: Patient is a 62 y.o. male referred to outpatient physical therapy with a medical diagnosis of s/p total replacement of left hip who presents with signs and symptoms consistent with left hip flexion contracture, left hip weakness, generalized weakness and unsteadiness on feet keeping him from ambulating independently s/p L THA on 09/17/2022. 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 Patient had R THA performed in August 2023 but was still very limited in reaching upright standing posture due to pain and stiffness at left hip and lumbar spine stiffness. He now has much better potential for improvement following joint replacements in both hips. He is very weak from prolonged inability to stand or move in an upright posture and has remaining stiffness in L > R hip from prolonged joint dysfunction prior to surgeries. Patient appears to have excellent potential to return to ambulation with no AD but will require prolonged strengthening and rehab due to the severity of his current weakness in  B hips (L>R) especially hip abduction that does not yet let him maintain SLS (MML 2/5 on left and 3+/5 on right). Patient presents with significant pain, ROM, muscle length, joint stiffness, spine posture, balance, proprioceptive, motor control, gait, muscle performance (strength/power/endurance), and activity tolerance impairments that are limiting ability to complete usual activities including household  and community ambulation, transfers, and bed mobility, standing, stairs, squatting, mobility without falling, walking without Keziah Avis, avoiding falls, being steady on feet, walking for exercise, walking downtown, standing up straight, unable to have shoulders replaced since he is dependent on UE weight bearing while walking, getting in and out of the car, dressing without difficulty. Patient will benefit from skilled physical therapy intervention to address current body structure impairments and activity limitations to improve function and work towards goals set in current POC in order to return to prior level of function or maximal functional improvement.      OBJECTIVE IMPAIRMENTS: Abnormal gait, decreased activity tolerance, decreased balance, decreased coordination, decreased endurance, decreased knowledge of condition, decreased knowledge of use of DME, decreased mobility, difficulty walking, decreased ROM, decreased strength, hypomobility, increased fascial restrictions, impaired perceived functional ability, increased muscle spasms, impaired flexibility, impaired UE functional use, improper body mechanics, postural dysfunction, obesity, and pain.    ACTIVITY LIMITATIONS: carrying, lifting, bending, standing, squatting, stairs, transfers, bed mobility, bathing, toileting, dressing, reach over head, locomotion level, and caring for others   PARTICIPATION LIMITATIONS: meal prep, cleaning, laundry, interpersonal relationship, shopping, community activity, yard work, and   difficulty with usual activities including household and community ambulation, transfers, and bed mobility, standing, stairs, squatting, mobility without falling, walking without Shresta Risden, avoiding falls, being steady on feet, walking for exercise, walking downtown, standing up straight, unable to have shoulders replaced since he is dependent on UE weight bearing while walking, getting in and out of the car, dressing   PERSONAL FACTORS:  Fitness, Past/current experiences, Time since onset of injury/illness/exacerbation, and 3+ comorbidities:   s/p R THA 06/14/2022, 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    REHAB POTENTIAL: Excellent   CLINICAL DECISION MAKING: Evolving/moderate complexity   EVALUATION COMPLEXITY: Moderate     GOALS: Goals reviewed with patient? No   SHORT TERM GOALS: Target date: 12/27/2022   Patient will be independent with initial home exercise program for self-management of symptoms. Baseline: Initial HEP provided at IE (12/13/22); participating some (12/27/2022);  Goal status: Partially met     LONG TERM GOALS: Target date: 03/07/2023. Target date for all long term goals updated to 07/03/2023 on 04/10/2023. Target date for all unmet long term goals updated to 08/28/2023.    Patient will be independent with a long-term home exercise program for self-management of symptoms.  Baseline: Initial HEP provided at IE (12/13/22); participating some (12/27/2022); mostly walking with [facility] (04/10/2023); mostly walking with SPC, some of the HEP from handouts, balancing when going out for a smoke (06/05/2023);  Goal status: In-progress   2.  Patient will demonstrate improved FOTO to equal or greater than 49 by visit #13 to demonstrate improvement in overall condition and self-reported functional ability.  Baseline: 36 (12/13/22); 30 at visit #3 (12/27/2022); 36 at visit #5 (02/05/2023); 52 at visit #7 (04/10/2023);  47 at visit #  10 (06/05/2023);  Goal status: In-progress   3.  Patient will demonstrate ability to ambulate equal or greater than 1200 feet on 6 Min Walk Test  with [facility] or less restrictive assistive device to help him return to walking for health and exercise in his community.  Baseline: 600 feet with RW (12/13/22); able to ambulate 200 feet at a time with [facility] and SBA-C[facility] (12/27/2022); 434 feet with SPC and SBA (02/05/2023); 400 feet with SPC, mod I (04/10/2023); 778 feet with SPC, mod I (06/05/2023);  Goal status: Ongoing   4.  Patient will complete 5 Times Sit To Stand from 18.5 inch surface or lower in equal or less than 11 seconds with no UE support to demonstrate improved fall risk and functional strength for household and community mobility and self care.  Baseline: 19 seconds from 18.5 inch plinth with use of B UE on table, RW in front for safety.  (12/13/22); not tested (12/27/2022); 22 seconds with no UE use (02/05/23); 18 seconds with no UE use (04/10/2023); 17 seconds with no UE use (06/05/2023);  Goal status: In-progress   5.  Patient will complete community, work and/or recreational activities with 75% less limitation due to current condition.  Baseline: difficulty with usual activities including household and community ambulation, transfers, and bed mobility, standing, stairs, squatting, mobility without falling, walking without Tylin Force, avoiding falls, being steady on feet, walking for exercise, walking downtown, standing up straight, unable to have shoulders replaced since he is dependent on UE weight bearing while walking, getting in and out of the car, dressing (12/13/22); peple have been commenting he is qucker getting up out of his chair ( 12/27/2022); improving but still far from his goal and still very impaired (02/05/2023); estimates 50-60% improvement (04/10/2023); estimates 90% improvement (06/05/2023);  Goal status: MET   6.  Patient will demonstrate the ability to perform single leg stance without trendelenburg for equal or greater than 10 seconds on each side to demonstrate improved hip strength for normal and functional gait pattern for  household and community mobility without and AD with decreased risk of falls.  Baseline: > 1 second on R, unable on left due to pain and weakness and trendelenburg sign (12/13/2022; 12/27/2022); < 1 second/unable (02/05/2023;04/10/2023; 06/05/2023);  Goal status: In-progress     PLAN:   PT FREQUENCY: 1-2x/week   PT DURATION: 12 weeks   PLANNED INTERVENTIONS: Therapeutic exercises, Therapeutic activity, Neuromuscular re-education, Balance training, Gait training, Patient/Family education, Self Care, Joint mobilization, Stair training, DME instructions, Aquatic Therapy, Dry Needling, Electrical stimulation, Spinal mobilization, Cryotherapy, Moist heat, Manual therapy, and Re-evaluation   PLAN FOR NEXT SESSION: update HEP as appropriate, progressive LE/hip/functional strengthening with particular attention to hip abduction, interventions for improved ROM (especially hip and low back extension), balance and gait training as appropriate, manual therapy as needed.    Viviann Spare, PT, DPT 07/16/2023, 8:57 AM  [facility] [address] [place], [place] [address] P: [phone] I F: [id number]

## 2023-07-16 ENCOUNTER — Ambulatory Visit: Payer: MEDICAID | Attending: Physician Assistant

## 2023-07-16 ENCOUNTER — Encounter: Payer: Self-pay | Admitting: Physical Therapy

## 2023-07-16 DIAGNOSIS — M25652 Stiffness of left hip, not elsewhere classified: Secondary | ICD-10-CM

## 2023-07-16 DIAGNOSIS — M25552 Pain in left hip: Secondary | ICD-10-CM | POA: Diagnosis present

## 2023-07-16 DIAGNOSIS — R293 Abnormal posture: Secondary | ICD-10-CM

## 2023-07-16 DIAGNOSIS — R262 Difficulty in walking, not elsewhere classified: Secondary | ICD-10-CM

## 2023-07-23 ENCOUNTER — Encounter: Payer: MEDICAID | Admitting: Physical Therapy

## 2023-07-24 ENCOUNTER — Ambulatory Visit: Payer: MEDICAID | Admitting: Physical Therapy

## 2023-07-29 ENCOUNTER — Encounter: Payer: Self-pay | Admitting: Physical Therapy

## 2023-07-29 ENCOUNTER — Ambulatory Visit: Payer: MEDICAID | Admitting: Physical Therapy

## 2023-07-29 DIAGNOSIS — R293 Abnormal posture: Secondary | ICD-10-CM

## 2023-07-29 DIAGNOSIS — M25552 Pain in left hip: Secondary | ICD-10-CM | POA: Diagnosis not present

## 2023-07-29 DIAGNOSIS — M25652 Stiffness of left hip, not elsewhere classified: Secondary | ICD-10-CM

## 2023-07-29 DIAGNOSIS — R262 Difficulty in walking, not elsewhere classified: Secondary | ICD-10-CM

## 2023-07-29 NOTE — Therapy (Signed)
OUTPATIENT PHYSICAL THERAPY TREATMENT  Patient Name: Johnny Banks MRN: [id number] DOB:08/08/1961, 62 y.o., male Today's Date: 07/29/2023  PCP: Fleet Contras, MD REFERRING PROVIDER: Sherley Bounds, PA   END OF SESSION:   PT End of Session - 07/29/23 0952     Visit Number 13    Number of Visits 24    Date for PT Re-Evaluation 08/28/23    Authorization Type [employer] reporting period from 06/05/2023    Authorization Time Period Netta Corrigan WUJW#[id number] 8/7-10/16 for 24 PT vistis    Authorization - Visit Number 2    Authorization - Number of Visits 24    Progress Note Due on Visit 20    PT Start Time 0951    PT Stop Time 1029    PT Time Calculation (min) 38 min    Equipment Utilized During Treatment Gait belt    Activity Tolerance Patient tolerated treatment well    Behavior During Therapy WFL for tasks assessed/performed              Past Medical History:  Diagnosis Date   Bipolar 1 disorder (HCC)    Hypertension    TBI (traumatic brain injury) [facility])    Past Surgical History:  Procedure Laterality Date   APPENDECTOMY     ESOPHAGOGASTRODUODENOSCOPY N/A 12/20/2016   Procedure: ESOPHAGOGASTRODUODENOSCOPY (EGD);  Surgeon: Bernette Redbird, MD;  Location: [facility];  Service: Endoscopy;  Laterality: N/A;   HIP FRACTURE SURGERY     Patient Active Problem List   Diagnosis Date Noted   Bilateral shoulder pain 01/04/2017   Physical deconditioning    Symptomatic anemia    Tobacco abuse    Melena 12/19/2016   Acute blood loss anemia 12/19/2016   ETOH abuse 12/19/2016   UGIB (upper gastrointestinal bleed) 12/19/2016   Hypotension, unspecified 04/04/2013   Sepsis (HCC) 04/02/2013   Bipolar affective disorder (HCC) 04/02/2013   Polysubstance abuse (HCC) 04/02/2013   HTN (hypertension) 04/02/2013   Syncope 04/02/2013   Acute kidney injury (HCC) 04/02/2013    REFERRING DIAG: s/p total replacement of left hip   THERAPY DIAG:  Pain in left  hip  Stiffness of left hip, not elsewhere classified  Abnormal posture  Difficulty in walking, not elsewhere classified  Rationale for Evaluation and Treatment: Rehabilitation  PERTINENT HISTORY: Patient is a 62 y.o. male who presents to outpatient physical therapy with a referral for medical diagnosis s/p total replacement of left hip. This patient's chief complaints consist of left hip pain, weakness, and stiffness, difficulty weight bearing, stooped posture, difficulty with mobility S/P L THA posterior approach 09/17/2022 leading to the following functional deficits: difficulty with usual activities including household and community ambulation, transfers, and bed mobility, standing, stairs, squatting, mobility without falling, walking without walker, avoiding falls, being steady on feet, walking for exercise, walking downtown, standing up straight, unable to have shoulders replaced since he is dependent on UE weight bearing while walking, getting in and out of the car, dressing. Relevant past medical history and comorbidities include s/p R THA 06/14/2022, osteopenia in left hip, current smoker, smoker's cough, HTN, ETOH abuse, bipolar affective disorder, bipolar I disorder, polysubstance abuse, syncope, physical deconditioning, B shoulder pain (patient reports he needs both replaced), appendectomy, chronic stiffness in B hips, prior B hip pinning, B hip dysfunciton since childhood, low back pain, TBI. Patient denies hx of cancer, stroke, seizures, heart problems, diabetes, unexplained weight loss, unexplained changes in bowel or bladder problems, unexplained stumbling or dropping things, osteoporosis, and spinal surgery.  PRECAUTIONS: Falls  SUBJECTIVE:                                                                                                                                                                                      SUBJECTIVE STATEMENT: Patient reports he missed last PT session  because his usual ride was not available. He states his left hip is not hurting him but his back bothers him a lot more than his hip. He wonders if he could have an MRI to find out why he continues to have "something like a bone sticking out at the left."  PAIN:  NPRS 0/10   OBJECTIVE  TODAY'S TREATMENT:    Therapeutic exercise: to centralize symptoms and improve ROM, strength, muscular endurance, and activity tolerance required for successful completion of functional activities.  - ambulation over ground 400 feet (250 with SPC in R UE, 150 feet holding SPC with heavy trendelenburg gait with left hip weakness) Required seated rest break due to back pain.   Superset: - staggered stance (R foot forward) sit <> stand from 17 inch chair with no UE support. 3x10. (All sets with 2#DB held at shoulders). Target placed on floor for right foot.  - ambulation 3x100 feet while carrying his cane practicing standing up tall.   - cone tip/right 1x5 each side with CGA.   Pt required multimodal cuing for proper technique and to facilitate improved neuromuscular control, strength, range of motion, and functional ability resulting in improved performance and form.   PATIENT EDUCATION:  Education details: Exercise purpose/form. Self management techniques. Education on diagnosis, prognosis, POC, anatomy and physiology of current condition Education on HEP including handout  Reviewed cancelation/no-show policy with patient; patient verbalized understanding (12/13/22). Person educated: Patient Education method: Explanation Education comprehension: verbalized understanding and needs further education   HOME EXERCISE PROGRAM: Access Code: [email or web address] URL: [email or web address] Date: 07/02/2023 Prepared by: Norton Blizzard  Exercises - Modified Maisie Fus Stretch  - 1 x daily - 3 reps - 1 minute hold - Bridge on Heels  - 1 x daily - 2 sets - 10 reps - 5 seconds hold - Diagonal Hip Extension  - 1 x  daily - 2 sets - 5 reps - Staggered Sit-to-Stand  - 3-5 x weekly - 3 sets - 10 reps    ASSESSMENT:   CLINICAL IMPRESSION:   Patient arrives with [facility] continuing to report decreased left hip pain but continued dependence on SPC due to weakness. Patient continued with LE/hip/functional strengthening as tolerated to improve his ability to ambulate without his cane. Patient appears to have greatest weakness in L hip abduction/extension that causes trendelenburg gait when walking that worsens after  the first 50-100 feet. Patient would benefit from continued management of limiting condition by skilled physical therapist to address remaining impairments and functional limitations to work towards stated goals and return to PLOF or maximal functional independence.   From Initial PT Eval 12/13/2022: Patient is a 62 y.o. male referred to outpatient physical therapy with a medical diagnosis of s/p total replacement of left hip who presents with signs and symptoms consistent with left hip flexion contracture, left hip weakness, generalized weakness and unsteadiness on feet keeping him from ambulating independently s/p L THA on 09/17/2022. 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 Patient had R THA performed in August 2023 but was still very limited in reaching upright standing posture due to pain and stiffness at left hip and lumbar spine stiffness. He now has much better potential for improvement following joint replacements in both hips. He is very weak from prolonged inability to stand or move in an upright posture and has remaining stiffness in L > R hip from prolonged joint dysfunction prior to surgeries. Patient appears to have excellent potential to return to ambulation with no AD but will require prolonged strengthening and rehab due to the severity of his current weakness in B hips (L>R) especially hip abduction that does not yet let him maintain SLS (MML 2/5 on left and  3+/5 on right). Patient presents with significant pain, ROM, muscle length, joint stiffness, spine posture, balance, proprioceptive, motor control, gait, muscle performance (strength/power/endurance), and activity tolerance impairments that are limiting ability to complete usual activities including household and community ambulation, transfers, and bed mobility, standing, stairs, squatting, mobility without falling, walking without walker, avoiding falls, being steady on feet, walking for exercise, walking downtown, standing up straight, unable to have shoulders replaced since he is dependent on UE weight bearing while walking, getting in and out of the car, dressing without difficulty. Patient will benefit from skilled physical therapy intervention to address current body structure impairments and activity limitations to improve function and work towards goals set in current POC in order to return to prior level of function or maximal functional improvement.      OBJECTIVE IMPAIRMENTS: Abnormal gait, decreased activity tolerance, decreased balance, decreased coordination, decreased endurance, decreased knowledge of condition, decreased knowledge of use of DME, decreased mobility, difficulty walking, decreased ROM, decreased strength, hypomobility, increased fascial restrictions, impaired perceived functional ability, increased muscle spasms, impaired flexibility, impaired UE functional use, improper body mechanics, postural dysfunction, obesity, and pain.    ACTIVITY LIMITATIONS: carrying, lifting, bending, standing, squatting, stairs, transfers, bed mobility, bathing, toileting, dressing, reach over head, locomotion level, and caring for others   PARTICIPATION LIMITATIONS: meal prep, cleaning, laundry, interpersonal relationship, shopping, community activity, yard work, and   difficulty with usual activities including household and community ambulation, transfers, and bed mobility, standing, stairs,  squatting, mobility without falling, walking without walker, avoiding falls, being steady on feet, walking for exercise, walking downtown, standing up straight, unable to have shoulders replaced since he is dependent on UE weight bearing while walking, getting in and out of the car, dressing   PERSONAL FACTORS: Fitness, Past/current experiences, Time since onset of injury/illness/exacerbation, and 3+ comorbidities:   s/p R THA 06/14/2022, osteopenia in left hip, current smoker, smoker's cough, HTN, ETOH abuse, bipolar affective disorder, bipolar I disorder, polysubstance abuse, syncope, physical deconditioning, B shoulder pain (patient reports he needs both replaced), appendectomy, chronic stiffness in B hips, prior B hip pinning, B hip  dysfunciton since childhood, low back pain, TBI are also affecting patient's functional outcome.    REHAB POTENTIAL: Excellent   CLINICAL DECISION MAKING: Evolving/moderate complexity   EVALUATION COMPLEXITY: Moderate     GOALS: Goals reviewed with patient? No   SHORT TERM GOALS: Target date: 12/27/2022   Patient will be independent with initial home exercise program for self-management of symptoms. Baseline: Initial HEP provided at IE (12/13/22); participating some (12/27/2022);  Goal status: Partially met     LONG TERM GOALS: Target date: 03/07/2023. Target date for all long term goals updated to 07/03/2023 on 04/10/2023. Target date for all unmet long term goals updated to 08/28/2023.    Patient will be independent with a long-term home exercise program for self-management of symptoms.  Baseline: Initial HEP provided at IE (12/13/22); participating some (12/27/2022); mostly walking with [facility] (04/10/2023); mostly walking with SPC, some of the HEP from handouts, balancing when going out for a smoke (06/05/2023);  Goal status: In-progress   2.  Patient will demonstrate improved FOTO to equal or greater than 49 by visit #13 to demonstrate improvement in overall  condition and self-reported functional ability.  Baseline: 36 (12/13/22); 30 at visit #3 (12/27/2022); 36 at visit #5 (02/05/2023); 52 at visit #7 (04/10/2023);  47 at visit #10 (06/05/2023);  Goal status: In-progress   3.  Patient will demonstrate ability to ambulate equal or greater than 1200 feet on 6 Min Walk Test with [facility] or less restrictive assistive device to help him return to walking for health and exercise in his community.  Baseline: 600 feet with RW (12/13/22); able to ambulate 200 feet at a time with [facility] and SBA-CGA (12/27/2022); 434 feet with SPC and SBA (02/05/2023); 400 feet with SPC, mod I (04/10/2023); 778 feet with SPC, mod I (06/05/2023);  Goal status: Ongoing   4.  Patient will complete 5 Times Sit To Stand from 18.5 inch surface or lower in equal or less than 11 seconds with no UE support to demonstrate improved fall risk and functional strength for household and community mobility and self care.  Baseline: 19 seconds from 18.5 inch plinth with use of B UE on table, RW in front for safety.  (12/13/22); not tested (12/27/2022); 22 seconds with no UE use (02/05/23); 18 seconds with no UE use (04/10/2023); 17 seconds with no UE use (06/05/2023);  Goal status: In-progress   5.  Patient will complete community, work and/or recreational activities with 75% less limitation due to current condition.  Baseline: difficulty with usual activities including household and community ambulation, transfers, and bed mobility, standing, stairs, squatting, mobility without falling, walking without walker, avoiding falls, being steady on feet, walking for exercise, walking downtown, standing up straight, unable to have shoulders replaced since he is dependent on UE weight bearing while walking, getting in and out of the car, dressing (12/13/22); peple have been commenting he is qucker getting up out of his chair ( 12/27/2022); improving but still far from his goal and still very impaired (02/05/2023); estimates  50-60% improvement (04/10/2023); estimates 90% improvement (06/05/2023);  Goal status: MET   6.  Patient will demonstrate the ability to perform single leg stance without trendelenburg for equal or greater than 10 seconds on each side to demonstrate improved hip strength for normal and functional gait pattern for household and community mobility without and AD with decreased risk of falls.  Baseline: > 1 second on R, unable on left due to pain and weakness and trendelenburg sign (12/13/2022; 12/27/2022); < 1  second/unable (02/05/2023;04/10/2023; 06/05/2023);  Goal status: In-progress     PLAN:   PT FREQUENCY: 1-2x/week   PT DURATION: 12 weeks   PLANNED INTERVENTIONS: Therapeutic exercises, Therapeutic activity, Neuromuscular re-education, Balance training, Gait training, Patient/Family education, Self Care, Joint mobilization, Stair training, DME instructions, Aquatic Therapy, Dry Needling, Electrical stimulation, Spinal mobilization, Cryotherapy, Moist heat, Manual therapy, and Re-evaluation   PLAN FOR NEXT SESSION: update HEP as appropriate, progressive LE/hip/functional strengthening with particular attention to hip abduction, interventions for improved ROM (especially hip and low back extension), balance and gait training as appropriate, manual therapy as needed.    Cira Rue, PT, DPT 07/29/2023, 10:29 AM  [facility] [address] [place], [place] [address] P: [phone] I F: [phone]

## 2023-07-30 ENCOUNTER — Encounter: Payer: MEDICAID | Admitting: Physical Therapy

## 2023-08-05 ENCOUNTER — Encounter: Payer: MEDICAID | Admitting: Physical Therapy

## 2023-08-06 ENCOUNTER — Encounter: Payer: MEDICAID | Admitting: Physical Therapy

## 2023-08-12 ENCOUNTER — Ambulatory Visit: Payer: MEDICAID | Admitting: Physical Therapy

## 2023-08-13 ENCOUNTER — Encounter: Payer: MEDICAID | Admitting: Physical Therapy

## 2023-08-20 ENCOUNTER — Ambulatory Visit: Payer: MEDICAID | Admitting: Physical Therapy

## 2023-08-27 ENCOUNTER — Ambulatory Visit: Payer: MEDICAID | Admitting: Physical Therapy

## 2023-09-12 ENCOUNTER — Ambulatory Visit: Payer: MEDICAID | Attending: Physician Assistant | Admitting: Physical Therapy

## 2023-09-17 ENCOUNTER — Ambulatory Visit: Payer: MEDICAID | Admitting: Physical Therapy

## 2023-09-18 ENCOUNTER — Encounter: Payer: MEDICAID | Admitting: Physical Therapy

## 2023-09-23 ENCOUNTER — Encounter: Payer: MEDICAID | Admitting: Physical Therapy

## 2023-09-25 ENCOUNTER — Encounter: Payer: MEDICAID | Admitting: Physical Therapy

## 2023-10-02 ENCOUNTER — Ambulatory Visit: Payer: MEDICAID | Admitting: Physical Therapy

## 2023-10-02 ENCOUNTER — Encounter: Payer: MEDICAID | Admitting: Physical Therapy

## 2023-10-09 ENCOUNTER — Ambulatory Visit: Payer: MEDICAID | Attending: Physician Assistant | Admitting: Physical Therapy

## 2023-10-09 ENCOUNTER — Encounter: Payer: MEDICAID | Admitting: Physical Therapy

## 2023-10-09 ENCOUNTER — Encounter: Payer: Self-pay | Admitting: Physical Therapy

## 2023-10-09 VITALS — BP 160/92 | HR 83

## 2023-10-09 DIAGNOSIS — R293 Abnormal posture: Secondary | ICD-10-CM | POA: Insufficient documentation

## 2023-10-09 DIAGNOSIS — M25652 Stiffness of left hip, not elsewhere classified: Secondary | ICD-10-CM | POA: Diagnosis present

## 2023-10-09 DIAGNOSIS — R262 Difficulty in walking, not elsewhere classified: Secondary | ICD-10-CM | POA: Insufficient documentation

## 2023-10-09 DIAGNOSIS — M25552 Pain in left hip: Secondary | ICD-10-CM | POA: Diagnosis present

## 2023-10-09 NOTE — Therapy (Signed)
OUTPATIENT PHYSICAL THERAPY TREATMENT / PROGRESS NOTE / RE-CERTIFICATION Dates of reporting from 06/05/2023 to 10/09/2023  Patient Name: Johnny Banks MRN: [id number] DOB:1961-08-08, 62 y.o., male Today's Date: 10/09/2023  PCP: Fleet Contras, MD REFERRING PROVIDER: Sherley Bounds, PA   END OF SESSION:   PT End of Session - 10/09/23 0911     Visit Number 14    Number of Visits 24    Date for PT Re-Evaluation 11/13/23    Authorization Type [employer] reporting period from 06/05/2023    Authorization Time Period Koren Shiver [id number] 10/31-11/13/23 for 12 PT visits    Authorization - Visit Number 1    Authorization - Number of Visits 12    Progress Note Due on Visit 20    PT Start Time 0905    PT Stop Time 0957    PT Time Calculation (min) 52 min    Equipment Utilized During Treatment Gait belt    Activity Tolerance Patient tolerated treatment well    Behavior During Therapy WFL for tasks assessed/performed               Past Medical History:  Diagnosis Date   Bipolar 1 disorder (HCC)    Hypertension    TBI (traumatic brain injury) [facility])    Past Surgical History:  Procedure Laterality Date   APPENDECTOMY     ESOPHAGOGASTRODUODENOSCOPY N/A 12/20/2016   Procedure: ESOPHAGOGASTRODUODENOSCOPY (EGD);  Surgeon: Bernette Redbird, MD;  Location: [facility];  Service: Endoscopy;  Laterality: N/A;   HIP FRACTURE SURGERY     Patient Active Problem List   Diagnosis Date Noted   Bilateral shoulder pain 01/04/2017   Physical deconditioning    Symptomatic anemia    Tobacco abuse    Melena 12/19/2016   Acute blood loss anemia 12/19/2016   ETOH abuse 12/19/2016   UGIB (upper gastrointestinal bleed) 12/19/2016   Hypotension, unspecified 04/04/2013   Sepsis (HCC) 04/02/2013   Bipolar affective disorder (HCC) 04/02/2013   Polysubstance abuse (HCC) 04/02/2013   HTN (hypertension) 04/02/2013   Syncope 04/02/2013   Acute kidney injury (HCC) 04/02/2013     REFERRING DIAG: s/p total replacement of left hip   THERAPY DIAG:  Pain in left hip  Stiffness of left hip, not elsewhere classified  Abnormal posture  Difficulty in walking, not elsewhere classified  Rationale for Evaluation and Treatment: Rehabilitation  PERTINENT HISTORY: Patient is a 62 y.o. male who presents to outpatient physical therapy with a referral for medical diagnosis s/p total replacement of left hip. This patient's chief complaints consist of left hip pain, weakness, and stiffness, difficulty weight bearing, stooped posture, difficulty with mobility S/P L THA posterior approach 09/17/2022 leading to the following functional deficits: difficulty with usual activities including household and community ambulation, transfers, and bed mobility, standing, stairs, squatting, mobility without falling, walking without walker, avoiding falls, being steady on feet, walking for exercise, walking downtown, standing up straight, unable to have shoulders replaced since he is dependent on UE weight bearing while walking, getting in and out of the car, dressing. Relevant past medical history and comorbidities include s/p R THA 06/14/2022, osteopenia in left hip, current smoker, smoker's cough, HTN, ETOH abuse, bipolar affective disorder, bipolar I disorder, polysubstance abuse, syncope, physical deconditioning, B shoulder pain (patient reports he needs both replaced), appendectomy, chronic stiffness in B hips, prior B hip pinning, B hip dysfunciton since childhood, low back pain, TBI. Patient denies hx of cancer, stroke, seizures, heart problems, diabetes, unexplained weight loss, unexplained changes in  bowel or bladder problems, unexplained stumbling or dropping things, osteoporosis, and spinal surgery.    PRECAUTIONS: Falls  SUBJECTIVE:                                                                                                                                                                                       SUBJECTIVE STATEMENT: Patient reports he has the same pain that has been going on for months and months that he is so used to that it is hard for him to describe. The pain is some in his left hip, but mostly in his lower back. He states his HEP has been hit or miss. He has been using his SPC most of the time. Sometimes if he had to go to the bathroom or just get a cup of coffee he might leave his cane. He has not fallen but still "wobbles like a weeble." He states he knows he needs to do his HEP more. He states he will see his orthopedic surgeon that did his THA in January.   PAIN:  NPRS 0-4/10 some in his left hip, but mostly in his lower back (pain when standing up).    OBJECTIVE  Vitals:   10/09/23 0912  BP: (!) 160/92  Pulse: 83  SpO2: 96%  Automatic cuff  SELF-REPORTED FUNCTION FOTO score: 38/100 (hip questionnaire)   FUNCTIONAL/BALANCE 6 Minute Walk Test: 756 feet with SPC in R UE, limited by low back pain and out of breath.    5 Times Sit To Stand Test:  17 seconds with no UE use from 17 inch chair.    L Single Leg Stance: 1 second, mild compensated trendelenburg R Single Leg Stance: < 1 seconds/unable, no trendelenburg  TODAY'S TREATMENT:    Therapeutic exercise: to centralize symptoms and improve ROM, strength, muscular endurance, and activity tolerance required for successful completion of functional activities.  - 6 Minute Walk Test to assess progress (see above) - half kneeling hip flexor stretch, 1x20 with 5 second hold each side (BUE support on each side of body and airex pad under knee).  - Education on HEP including handout and timer to use with HEP  Neuromuscular Re-education: to improve, balance, postural strength, muscle activation patterns, and stabilization strength required for functional activities: - single leg stance testing to assess progress (see above)  Therapeutic activities: dynamic activities for functional strengthening and  improved functional activity tolerance. - split squat to tolerated depth (lacks control last 4 inches to touch airex pad), BUE support (each side of body), 1x10 each side.  - ambulation 1x100 feet with no AD (L trendelenburg gait)  Pt required multimodal cuing for proper technique  and to facilitate improved neuromuscular control, strength, range of motion, and functional ability resulting in improved performance and form.   PATIENT EDUCATION:  Education details: Exercise purpose/form. Self management techniques. Education on diagnosis, prognosis, POC, anatomy and physiology of current condition Education on HEP including handout  Reviewed cancelation/no-show policy with patient; patient verbalized understanding (12/13/22). Person educated: Patient Education method: Explanation Education comprehension: verbalized understanding and needs further education   HOME EXERCISE PROGRAM: Access Code: [email or web address] URL: [email or web address] Date: 07/02/2023 Prepared by: Norton Blizzard  Exercises - Modified Thomas Stretch  - 1 x daily - 3 reps - 1 minute hold - Bridge on Heels  - 1 x daily - 2 sets - 10 reps - 5 seconds hold - Diagonal Hip Extension  - 1 x daily - 2 sets - 5 reps - Staggered Sit-to-Stand  - 3-5 x weekly - 3 sets - 10 reps    ASSESSMENT:   CLINICAL IMPRESSION:   Patient returns to PT after 10 week absence (last appt 07/29/2023). He has completed 14 physical therapy sessions since starting current episode of care on 12/13/2022. His progress has been hindered by poor attendance and poor HEP participation. He has made gradual progress and today shows improvement in 5 Time Sit To Stand test, but worsening in FOTO score and self-reported  functional improvement. SLS is slightly better on L LE and 6 Minute Walk Test is approximately equivocal since last progress note. He does demonstrate improved stability and confidence ambulating without AD but continues to have a considerable L  trendelenburg gait. He also continues to have significant left hip extension ROM loss that is likely contributing to his difficulty walking (along with lumbar flattening/stiffness). HEP was updated to more advanced exercises to improve hip strength and mobility and help normalize walking pattern. Printed schedule provided to patient for upcoming appointments and importance of participation in HEP and visit attendance was emphasized during   From Initial PT Eval 12/13/2022: Patient is a 62 y.o. male referred to outpatient physical therapy with a medical diagnosis of s/p total replacement of left hip who presents with signs and symptoms consistent with left hip flexion contracture, left hip weakness, generalized weakness and unsteadiness on feet keeping him from ambulating independently s/p L THA on 09/17/2022. 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 Patient had R THA performed in August 2023 but was still very limited in reaching upright standing posture due to pain and stiffness at left hip and lumbar spine stiffness. He now has much better potential for improvement following joint replacements in both hips. He is very weak from prolonged inability to stand or move in an upright posture and has remaining stiffness in L > R hip from prolonged joint dysfunction prior to surgeries. Patient appears to have excellent potential to return to ambulation with no AD but will require prolonged strengthening and rehab due to the severity of his current weakness in B hips (L>R) especially hip abduction that does not yet let him maintain SLS (MML 2/5 on left and 3+/5 on right). Patient presents with significant pain, ROM, muscle length, joint stiffness, spine posture, balance, proprioceptive, motor control, gait, muscle performance (strength/power/endurance), and activity tolerance impairments that are limiting ability to complete usual activities including household and community  ambulation, transfers, and bed mobility, standing, stairs, squatting, mobility without falling, walking without walker, avoiding falls, being steady on feet, walking for exercise, walking downtown, standing up straight, unable to have shoulders replaced  since he is dependent on UE weight bearing while walking, getting in and out of the car, dressing without difficulty. Patient will benefit from skilled physical therapy intervention to address current body structure impairments and activity limitations to improve function and work towards goals set in current POC in order to return to prior level of function or maximal functional improvement.      OBJECTIVE IMPAIRMENTS: Abnormal gait, decreased activity tolerance, decreased balance, decreased coordination, decreased endurance, decreased knowledge of condition, decreased knowledge of use of DME, decreased mobility, difficulty walking, decreased ROM, decreased strength, hypomobility, increased fascial restrictions, impaired perceived functional ability, increased muscle spasms, impaired flexibility, impaired UE functional use, improper body mechanics, postural dysfunction, obesity, and pain.    ACTIVITY LIMITATIONS: carrying, lifting, bending, standing, squatting, stairs, transfers, bed mobility, bathing, toileting, dressing, reach over head, locomotion level, and caring for others   PARTICIPATION LIMITATIONS: meal prep, cleaning, laundry, interpersonal relationship, shopping, community activity, yard work, and   difficulty with usual activities including household and community ambulation, transfers, and bed mobility, standing, stairs, squatting, mobility without falling, walking without walker, avoiding falls, being steady on feet, walking for exercise, walking downtown, standing up straight, unable to have shoulders replaced since he is dependent on UE weight bearing while walking, getting in and out of the car, dressing   PERSONAL FACTORS: Fitness,  Past/current experiences, Time since onset of injury/illness/exacerbation, and 3+ comorbidities:   s/p R THA 06/14/2022, 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    REHAB POTENTIAL: Excellent   CLINICAL DECISION MAKING: Evolving/moderate complexity   EVALUATION COMPLEXITY: Moderate     GOALS: Goals reviewed with patient? No   SHORT TERM GOALS: Target date: 12/27/2022   Patient will be independent with initial home exercise program for self-management of symptoms. Baseline: Initial HEP provided at IE (12/13/22); participating some (12/27/2022);  Goal status: Partially met     LONG TERM GOALS: Target date: 03/07/2023. Target date for all long term goals updated to 07/03/2023 on 04/10/2023. Target date for all unmet long term goals updated to 08/28/2023. Target date updated to 11/13/2023 for all unmet goals on 10/09/2023.    Patient will be independent with a long-term home exercise program for self-management of symptoms.  Baseline: Initial HEP provided at IE (12/13/22); participating some (12/27/2022); mostly walking with [facility] (04/10/2023); mostly walking with SPC, some of the HEP from handouts, balancing when going out for a smoke (06/05/2023); has not been participating well (10/09/2023);  Goal status: In-progress   2.  Patient will demonstrate improved FOTO to equal or greater than 49 by visit #13 to demonstrate improvement in overall condition and self-reported functional ability.  Baseline: 36 (12/13/22); 30 at visit #3 (12/27/2022); 36 at visit #5 (02/05/2023); 52 at visit #7 (04/10/2023);  47 at visit #10 (06/05/2023); 38 at visit #14 (10/09/2023);   Goal status: In-progress   3.  Patient will demonstrate ability to ambulate equal or greater than 1200 feet on 6 Min Walk Test with [facility] or less restrictive assistive device to help him return to walking for health and exercise in his community.  Baseline: 600 feet with RW (12/13/22); able to ambulate 200 feet at a time with [facility] and SBA-C[facility] (12/27/2022); 434 feet  with SPC and SBA (02/05/2023); 400 feet with SPC, mod I (04/10/2023); 778 feet with SPC, mod I (06/05/2023); 756 feet mod I with SPC (09/29/2023); Goal status: Ongoing   4.  Patient will complete 5 Times Sit To Stand from 18.5 inch surface or lower in equal or less than 11 seconds with no UE support to demonstrate improved fall risk and functional strength for household and community mobility and self care.  Baseline: 19 seconds from 18.5 inch plinth with use of B UE on table, RW in front for safety.  (12/13/22); not tested (12/27/2022); 22 seconds with no UE use (02/05/23); 18 seconds with no UE use (04/10/2023); 17 seconds with no UE use from 18.5 inch plinth (06/05/2023); 17 seconds with no UE use from 17 inch chair (10/09/2023);  Goal status: In-progress   5.  Patient will complete community, work and/or recreational activities with 75% less limitation due to current condition.  Baseline: difficulty with usual activities including household and community ambulation, transfers, and bed mobility, standing, stairs, squatting, mobility without falling, walking without walker, avoiding falls, being steady on feet, walking for exercise, walking downtown, standing up straight, unable to have shoulders replaced since he is dependent on UE weight bearing while walking, getting in and out of the car, dressing (12/13/22); peple have been commenting he is qucker getting up out of his chair ( 12/27/2022); improving but still far from his goal and still very impaired (02/05/2023); estimates 50-60% improvement (04/10/2023); estimates 90% improvement (06/05/2023);   estimates 50% improvement (10/09/2023);  Goal status: previously MET   6.  Patient will demonstrate the ability to perform single leg stance without trendelenburg for equal or greater than 10 seconds on each side to demonstrate improved hip strength for normal and functional gait pattern for household and community mobility without and AD with decreased risk of falls.  Baseline: > 1 second on R, unable on left due to pain and weakness and trendelenburg sign (12/13/2022; 12/27/2022); < 1 second/unable (02/05/2023;04/10/2023; 06/05/2023);  Goal status: In-progress     PLAN:   PT FREQUENCY: 1-2x/week   PT DURATION: 5 weeks   PLANNED INTERVENTIONS: Therapeutic exercises, Therapeutic activity, Neuromuscular re-education, Balance training, Gait training, Patient/Family education, Self Care, Joint mobilization, Stair training, DME instructions, Aquatic Therapy, Dry Needling, Electrical stimulation, Spinal mobilization, Cryotherapy, Moist heat, Manual therapy, and Re-evaluation   PLAN FOR NEXT SESSION: update HEP as appropriate, progressive LE/hip/functional strengthening with particular attention to hip abduction, interventions for improved ROM (especially hip and low back extension), balance and gait training as appropriate, manual therapy as needed.    Cira Rue, PT, DPT 10/09/2023, 10:51 AM  [facility] [address] [place], [place] [address] P: [phone] I F: [phone]

## 2023-10-17 ENCOUNTER — Telehealth: Payer: Self-pay | Admitting: Physical Therapy

## 2023-10-17 ENCOUNTER — Ambulatory Visit: Payer: MEDICAID | Attending: Physician Assistant | Admitting: Physical Therapy

## 2023-10-17 NOTE — Telephone Encounter (Signed)
Called patient when he did not show up for his PT appointment scheduled at 9:45am today. Patient said he did not know he had an appointment and that the schedule we gave him was "all kinds of messed up." With further questions he said the day/date was not lined up on the paper schedule he was provided last time. He was informed that was impossible unless there was a printer error that messed up the printing of the sheet (which we would not have provided to him) and he accepted what I said. I informed him we are no longer abl eto schedule him more than one visit at at a time now due to all of the no-shows and last minute cancels. Confirmed next PT appointment with him (10/24/23 at 9:45am) and he said he would be there. He also said he had been walking without his his walker a times.   Admin staff informed of scheduling changes.   Luretha Murphy. Ilsa Iha, PT, DPT 10/17/23, 10:17 AM  [facility] [address] [place], [place] [address] P: [phone] I F: [phone]

## 2023-10-21 ENCOUNTER — Encounter: Payer: MEDICAID | Admitting: Physical Therapy

## 2023-10-24 ENCOUNTER — Ambulatory Visit: Payer: MEDICAID | Admitting: Physical Therapy

## 2023-10-29 ENCOUNTER — Ambulatory Visit: Payer: Medicaid Other | Admitting: Dermatology

## 2023-10-30 ENCOUNTER — Encounter: Payer: MEDICAID | Admitting: Physical Therapy

## 2023-10-31 ENCOUNTER — Ambulatory Visit: Payer: MEDICAID | Admitting: Physical Therapy

## 2023-11-11 ENCOUNTER — Encounter: Payer: MEDICAID | Admitting: Physical Therapy

## 2024-02-05 ENCOUNTER — Telehealth: Payer: Self-pay

## 2024-02-05 DIAGNOSIS — L304 Erythema intertrigo: Secondary | ICD-10-CM

## 2024-02-05 MED ORDER — KETOCONAZOLE 2 % EX CREA
TOPICAL_CREAM | CUTANEOUS | 2 refills | Status: AC
Start: 2024-02-05 — End: ?

## 2024-02-05 MED ORDER — PIMECROLIMUS 1 % EX CREA
TOPICAL_CREAM | CUTANEOUS | 2 refills | Status: DC
Start: 2024-02-05 — End: 2024-04-01

## 2024-02-05 NOTE — Telephone Encounter (Signed)
 Patient called requesting refills of Ketoconazole cream and Elidel cream Pharmacy [facility] pharmacy services in [place] Ketoconazole cream and Elidel cream sent to [facility]

## 2024-04-01 ENCOUNTER — Other Ambulatory Visit: Payer: Self-pay

## 2024-04-01 DIAGNOSIS — L304 Erythema intertrigo: Secondary | ICD-10-CM

## 2024-04-01 MED ORDER — PIMECROLIMUS 1 % EX CREA
TOPICAL_CREAM | CUTANEOUS | 0 refills | Status: DC
Start: 2024-04-01 — End: 2024-05-04

## 2024-04-01 NOTE — Progress Notes (Signed)
 Patient left voicemail requesting RF of Pimecrolimus  to new pharmacy. RF sent with message pt will need appt. aw

## 2024-04-02 ENCOUNTER — Telehealth: Payer: Self-pay

## 2024-04-02 ENCOUNTER — Other Ambulatory Visit: Payer: Self-pay

## 2024-04-02 DIAGNOSIS — L304 Erythema intertrigo: Secondary | ICD-10-CM

## 2024-04-02 NOTE — Telephone Encounter (Signed)
 [facility] called to let us  know they can not get Elidel  cream, they can only get Pimecrolimus  cream but patients insurance will only cover Elidel  cream brand.   I called to let patient know this information and ask he had a another pharmacy I could send this rx too, patient report he live in a group home and he only suppose to get his rx from [facility], he will call [facility] because they have delivered him Elidel  in the past.

## 2024-05-04 ENCOUNTER — Telehealth: Payer: Self-pay

## 2024-05-04 DIAGNOSIS — L304 Erythema intertrigo: Secondary | ICD-10-CM

## 2024-05-04 MED ORDER — PIMECROLIMUS 1 % EX CREA
TOPICAL_CREAM | CUTANEOUS | 0 refills | Status: AC
Start: 2024-05-04 — End: ?

## 2024-05-04 NOTE — Telephone Encounter (Signed)
 Patient requesting refills of Elidel  cream. Advised patient he does need to be seen once a year for refills. Appointment scheduled and medication sent to pharmacy.

## 2024-06-16 ENCOUNTER — Ambulatory Visit: Payer: MEDICAID | Admitting: Dermatology

## 2024-07-07 ENCOUNTER — Encounter: Payer: Self-pay | Admitting: Dermatology

## 2024-07-07 ENCOUNTER — Ambulatory Visit: Admitting: Dermatology

## 2024-07-07 DIAGNOSIS — L291 Pruritus scroti: Secondary | ICD-10-CM

## 2024-07-07 DIAGNOSIS — L304 Erythema intertrigo: Secondary | ICD-10-CM

## 2024-07-07 DIAGNOSIS — L299 Pruritus, unspecified: Secondary | ICD-10-CM

## 2024-07-07 DIAGNOSIS — Z79899 Other long term (current) drug therapy: Secondary | ICD-10-CM

## 2024-07-07 DIAGNOSIS — L0100 Impetigo, unspecified: Secondary | ICD-10-CM

## 2024-07-07 DIAGNOSIS — R234 Changes in skin texture: Secondary | ICD-10-CM

## 2024-07-07 DIAGNOSIS — Z7189 Other specified counseling: Secondary | ICD-10-CM

## 2024-07-07 DIAGNOSIS — R208 Other disturbances of skin sensation: Secondary | ICD-10-CM

## 2024-07-07 DIAGNOSIS — L280 Lichen simplex chronicus: Secondary | ICD-10-CM

## 2024-07-07 MED ORDER — CLOBETASOL PROPIONATE 0.05 % EX OINT: TOPICAL_OINTMENT | CUTANEOUS | 0 refills | Status: DC

## 2024-07-07 MED ORDER — FLUCONAZOLE 200 MG PO TABS: 200.0000 mg | ORAL_TABLET | ORAL | 0 refills | Status: DC

## 2024-07-07 MED ORDER — MUPIROCIN 2 % EX OINT: TOPICAL_OINTMENT | CUTANEOUS | 0 refills | Status: DC

## 2024-07-07 NOTE — Patient Instructions (Addendum)
 Stop ketoconazole  cream  Stop elidil cream    For rash at scrotum  Start mupirocin  2 % ointment - apply to any cracks at skin on scrotum daily not in groin  Start clobetasol  ointment - apply topically to scrotum twice daily for itch and rash. Avoid face and under arms and avoid groin   Topical steroids (such as triamcinolone, fluocinolone, fluocinonide, mometasone, clobetasol , halobetasol, betamethasone, hydrocortisone) can cause thinning and lightening of the skin if they are used for too long in the same area. Your physician has selected the right strength medicine for your problem and area affected on the body. Please use your medication only as directed by your physician to prevent side effects.   Start fluconazole  200 mg pill - take 1 by mouth weekly for 4 weeks for rash at scrotum  Side effects of fluconazole  (diflucan ) include nausea, diarrhea, headache, dizziness, taste changes, rare risk of irritation of the liver, allergy, or decreased blood counts (which could show up as infection or tiredness).  Ok to continue lidocaine  cream    Due to recent changes in healthcare laws, you may see results of your pathology and/or laboratory studies on MyChart before the doctors have had a chance to review them. We understand that in some cases there may be results that are confusing or concerning to you. Please understand that not all results are received at the same time and often the doctors may need to interpret multiple results in order to provide you with the best plan of care or course of treatment. Therefore, we ask that you please give us  2 business days to thoroughly review all your results before contacting the office for clarification. Should we see a critical lab result, you will be contacted sooner.   If You Need Anything After Your Visit  If you have any questions or concerns for your doctor, please call our main line at [phone] and press option 4 to reach your doctor's medical  assistant. If no one answers, please leave a voicemail as directed and we will return your call as soon as possible. Messages left after 4 pm will be answered the following business day.   You may also send us  a message via MyChart. We typically respond to MyChart messages within 1-2 business days.  For prescription refills, please ask your pharmacy to contact our office. Our fax number is [phone].  If you have an urgent issue when the clinic is closed that cannot wait until the next business day, you can page your doctor at the number below.    Please note that while we do our best to be available for urgent issues outside of office hours, we are not available 24/7.   If you have an urgent issue and are unable to reach us , you may choose to seek medical care at your doctor's office, retail clinic, urgent care center, or emergency room.  If you have a medical emergency, please immediately call 911 or go to the emergency department.  Pager Numbers  - Dr. Hester: [phone]  - Dr. Jackquline: [phone]  - Dr. Claudene: [phone]   - Dr. Raymund: [phone]  In the event of inclement weather, please call our main line at [phone] for an update on the status of any delays or closures.  Dermatology Medication Tips: Please keep the boxes that topical medications come in in order to help keep track of the instructions about where and how to use these. Pharmacies typically print the medication instructions only on the  boxes and not directly on the medication tubes.   If your medication is too expensive, please contact our office at [phone] option 4 or send us  a message through MyChart.   We are unable to tell what your co-pay for medications will be in advance as this is different depending on your insurance coverage. However, we may be able to find a substitute medication at lower cost or fill out paperwork to get insurance to cover a needed medication.   If a prior  authorization is required to get your medication covered by your insurance company, please allow us  1-2 business days to complete this process.  Drug prices often vary depending on where the prescription is filled and some pharmacies may offer cheaper prices.  The website www.goodrx.com contains coupons for medications through different pharmacies. The prices here do not account for what the cost may be with help from insurance (it may be cheaper with your insurance), but the website can give you the price if you did not use any insurance.  - You can print the associated coupon and take it with your prescription to the pharmacy.  - You may also stop by our office during regular business hours and pick up a GoodRx coupon card.  - If you need your prescription sent electronically to a different pharmacy, notify our office through [facility] or by phone at [phone] option 4.     Si Usted Necesita Algo Despus de Su Visita  Tambin puede enviarnos un mensaje a travs de Clinical cytogeneticist. Por lo general respondemos a los mensajes de MyChart en el transcurso de 1 a 2 das hbiles.  Para renovar recetas, por favor pida a su farmacia que se ponga en contacto con nuestra oficina. Randi lakes de fax es [place] [phone].  Si tiene un asunto urgente cuando la clnica est cerrada y que no puede esperar hasta el siguiente da hbil, puede llamar/localizar a su doctor(a) al nmero que aparece a continuacin.   Por favor, tenga en cuenta que aunque hacemos todo lo posible para estar disponibles para asuntos urgentes fuera del horario de [place], no estamos disponibles las 24 horas del da, los 7 [address]  [address] de la [place].   Si tiene un problema urgente y no puede comunicarse con nosotros, puede optar por buscar atencin mdica  en el consultorio de su doctor(a), en una clnica privada, en un centro de atencin urgente o en una sala de emergencias.  Si tiene Engineer, drilling, por favor llame  inmediatamente al 911 o vaya a la sala de emergencias.  Nmeros de bper  - Dr. Hester: [phone]  - Dra. Jackquline: [phone]  - Dr. Claudene: [phone]  - Dra. Kitts: [phone]  En caso de inclemencias del [place], por favor llame a nuestra lnea principal al [phone] para una actualizacin sobre el estado de cualquier retraso o cierre.  Consejos para la medicacin en dermatologa: Por favor, guarde las cajas en las que vienen los medicamentos de uso tpico para ayudarle a seguir las instrucciones sobre dnde y cmo usarlos. Las farmacias generalmente imprimen las instrucciones del medicamento slo en las cajas y no directamente en los tubos del [place].   Si su medicamento es muy caro, por favor, pngase en contacto con landry rieger llamando al [phone] y presione la opcin 4 o envenos un mensaje a travs de Clinical cytogeneticist.   No podemos decirle cul ser su copago por los medicamentos por adelantado ya que esto es diferente dependiendo de la cobertura de su seguro. Sin  embargo, es posible que podamos encontrar un medicamento sustituto a Audiological scientist un formulario para que el seguro cubra el medicamento que se considera necesario.   Si se requiere una autorizacin previa para que su compaa de seguros malta su medicamento, por favor permtanos de 1 a 2 das hbiles para completar este proceso.  Los precios de los medicamentos varan con frecuencia dependiendo del Environmental consultant de dnde se surte la receta y alguna farmacias pueden ofrecer precios ms baratos.  El sitio web www.goodrx.com tiene cupones para medicamentos de Health and safety inspector. Los precios aqu no tienen en cuenta lo que podra costar con la ayuda del seguro (puede ser ms barato con su seguro), pero el sitio web puede darle el precio si no utiliz Tourist information centre manager.  - Puede imprimir el cupn correspondiente y llevarlo con su receta a la farmacia.  - Tambin puede pasar por nuestra oficina durante el horario  de atencin regular y Education officer, museum una tarjeta de cupones de GoodRx.  - Si necesita que su receta se enve electrnicamente a una farmacia diferente, informe a nuestra oficina a travs de MyChart de [place] o por telfono llamando al [phone] y presione la opcin 4.

## 2024-07-07 NOTE — Progress Notes (Signed)
 Follow-Up Visit   Subjective  Johnny Banks is a 63 y.o. male who presents for the following: follow up on intertrigo and reports a rash at scrotum. States that rash has gotten worse at scrotum and causing him a lot of pain and discomfort. Expressing affecting his mental health. States he is having a lot of itching and burning at scrotal area.   Has been using elidel  cream and ketoconazole  cream but not helping. Also using lidocaine  recommended by his primary.  The following portions of the chart were reviewed this encounter and updated as appropriate: medications, allergies, medical history  Review of Systems:  No other skin or systemic complaints except as noted in HPI or Assessment and Plan.  Objective  Well appearing patient in no apparent distress; mood and affect are within normal limits.   A focused examination was performed of the following areas: Scrotum and groin  Relevant exam findings are noted in the Assessment and Plan.    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 Condition is bothersome/symptomatic for patient. Currently flared.  Intertrigo is a chronic recurrent rash that occurs in skin fold areas that may be associated with friction; heat; moisture; yeast; fungus; and bacteria.  It is exacerbated by increased movement / activity; sweating; and higher atmospheric temperature.  Use of an absorbant powder such as Zeasorb AF powder or other OTC antifungal powder to the area daily can prevent rash recurrence. Other options to help keep the area dry include blow drying the area after bathing or using antiperspirant products such as Duradry sweat minimizing gel.  Treatable, but not curable  Recommend stopping medication used for intertrigo.    Start Mupirocin  2 % ointment - apply topically to split skin on scrotum daily  Start Clobetasol  0.05 % ointment - apply topically to scrotum twice daily for rash  Avoid face/groin/axilla  Reviewed previous liver function test from 01/30/2023  - ok   Start fluconazole  200 mg capsule - take 1 capsule by mouth once a week for 4 weeks   Will recheck in 1 month  Topical steroids (such as triamcinolone, fluocinolone, fluocinonide, mometasone, clobetasol , halobetasol, betamethasone, hydrocortisone) can cause thinning and lightening of the skin if they are used for too long in the same area. Your physician has selected the right strength medicine for your problem and area affected on the body. Please use your medication only as directed by your physician to prevent side effects.   Side effects of fluconazole  (diflucan ) include nausea, diarrhea, headache, dizziness, taste changes, rare risk of irritation of the liver, allergy, or decreased blood counts (which could show up as infection or tiredness).   PRURITUS   Related Medications mupirocin  ointment (BACTROBAN ) 2 % Apply topically to cracked skin at scrotum daily fluconazole  (DIFLUCAN ) 200 MG tablet Take 1 tablet (200 mg total) by mouth once a week. For rash at scrotum clobetasol  ointment (TEMOVATE ) 0.05 % Apply topically twice daily to scrotum for rash SKIN FISSURES   SKIN PAIN   PRURITUS SCROTI   COUNSELING AND COORDINATION OF CARE   MEDICATION MANAGEMENT   IMPETIGO   INTERTRIGO   Related Medications ketoconazole  (NIZORAL ) 2 % cream Apply to affected skin at bedtime pimecrolimus  (ELIDEL ) 1 % cream Apply to affected skin in the morning  Return in about 1 month (around  08/07/2024) for rash at groin follow up.  IEleanor Blush, CMA, am acting as scribe for Alm Rhyme, MD.    Documentation: I have reviewed the above documentation for accuracy and completeness, and I agree with the above.  Alm Rhyme,  MD

## 2024-08-05 ENCOUNTER — Ambulatory Visit: Admitting: Dermatology

## 2024-08-26 ENCOUNTER — Ambulatory Visit: Admitting: Dermatology

## 2024-09-01 ENCOUNTER — Ambulatory Visit: Admitting: Dermatology

## 2024-10-27 ENCOUNTER — Ambulatory Visit: Admitting: Dermatology

## 2024-10-27 ENCOUNTER — Encounter: Payer: Self-pay | Admitting: Dermatology

## 2024-10-27 DIAGNOSIS — R234 Changes in skin texture: Secondary | ICD-10-CM

## 2024-10-27 DIAGNOSIS — L304 Erythema intertrigo: Secondary | ICD-10-CM

## 2024-10-27 DIAGNOSIS — L281 Prurigo nodularis: Secondary | ICD-10-CM

## 2024-10-27 DIAGNOSIS — Z79899 Other long term (current) drug therapy: Secondary | ICD-10-CM

## 2024-10-27 DIAGNOSIS — L299 Pruritus, unspecified: Secondary | ICD-10-CM

## 2024-10-27 DIAGNOSIS — Z7189 Other specified counseling: Secondary | ICD-10-CM

## 2024-10-27 MED ORDER — CLOBETASOL PROPIONATE 0.05 % EX OINT: TOPICAL_OINTMENT | CUTANEOUS | 0 refills | Status: DC

## 2024-10-27 MED ORDER — MUPIROCIN 2 % EX OINT: TOPICAL_OINTMENT | CUTANEOUS | 0 refills | Status: AC

## 2024-10-27 MED ORDER — FLUCONAZOLE 200 MG PO TABS: 200.0000 mg | ORAL_TABLET | ORAL | 0 refills | Status: AC

## 2024-10-27 NOTE — Progress Notes (Signed)
 Follow-Up Visit   Subjective  Johnny Banks is a 63 y.o. male who presents for the following: patient followup for pruritus with intertrigo at scrotum Pt missed last follow up appt.  The following portions of the chart were reviewed this encounter and updated as appropriate: medications, allergies, medical history  Review of Systems:  No other skin or systemic complaints except as noted in HPI or Assessment and Plan.  Objective  Well appearing patient in no apparent distress; mood and affect are within normal limits.  A focused examination was performed of the following areas: Scrotum   Relevant exam findings are noted in the Assessment and Plan.    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 Condition is improving with treatment but not currently at goal.  Intertrigo is a chronic recurrent rash that occurs in skin fold areas that may be associated with friction; heat; moisture; yeast; fungus; and bacteria.  It is exacerbated by increased movement / activity; sweating; and higher atmospheric temperature.  Use of an absorbant powder such as Zeasorb AF powder or other OTC antifungal powder to the area daily can prevent rash recurrence. Other options to help keep the area dry include blow drying the area after bathing or using antiperspirant products such as Duradry sweat minimizing gel.   Discussed with patient that condition is treatable, but not curable.  Goal is better control.  Recommend stopping medication used for intertrigo.    Continue Mupirocin  2 % ointment - apply topically to split skin on scrotum daily for fissures to prevent infection.   Continue Clobetasol  0.05 %  ointment - apply topically to scrotum daily for rash - will help with itch and rash and decrease thickening of scrotum skin (lichenification and Lichen simplex changes)  Reviewed previous liver function test from 01/30/2023 - ok     Re-Start  fluconazole  200 mg capsule - take 1 capsule by mouth once a week for 4 weeks    Then switch to Eucrisa in 1 month will re-evaluate in 1 month before switching medication   MAY CONSIDER Nemluvio in future   Topical steroids (such as triamcinolone, fluocinolone, fluocinonide, mometasone, clobetasol , halobetasol, betamethasone, hydrocortisone) can cause thinning and lightening of the skin if they are used for too long in the same area. Your physician has selected the right strength medicine for your problem and area affected on the body. Please use your medication only as directed by your physician to prevent side effects.    Side effects of fluconazole  (diflucan ) include nausea, diarrhea, headache, dizziness, taste changes, rare risk of irritation of the liver, allergy, or decreased blood counts (which could show up as infection or tiredness).  PRURITUS   This Visit - clobetasol  ointment (TEMOVATE ) 0.05 % - Apply topically  daily to scrotum for rash - mupirocin  ointment (BACTROBAN ) 2 % - Apply topically to cracked skin at scrotum daily - fluconazole  (DIFLUCAN ) 200 MG tablet - Take 1 tablet (200 mg total) by mouth once a week. For rash at scrotum PRURIGO NODULARIS   SKIN FISSURES   INTERTRIGO   Existing Treatments - ketoconazole  (NIZORAL ) 2 % cream - Apply to affected skin at bedtime - pimecrolimus  (ELIDEL ) 1 % cream - Apply to affected  skin in the morning  Return for 4 to 6 week follow up on pruritis / intertrigo at scrotum.  IEleanor Blush, CMA, am acting as scribe for Alm Rhyme, MD.   Documentation: I have reviewed the above documentation for accuracy and completeness, and I agree with the above.  Alm Rhyme, MD

## 2024-10-27 NOTE — Patient Instructions (Addendum)

## 2024-11-25 ENCOUNTER — Ambulatory Visit: Admitting: Dermatology

## 2024-12-09 ENCOUNTER — Encounter: Payer: Self-pay | Admitting: Dermatology

## 2024-12-09 ENCOUNTER — Ambulatory Visit: Admitting: Dermatology

## 2024-12-09 DIAGNOSIS — L304 Erythema intertrigo: Secondary | ICD-10-CM | POA: Diagnosis not present

## 2024-12-09 DIAGNOSIS — L280 Lichen simplex chronicus: Secondary | ICD-10-CM | POA: Diagnosis not present

## 2024-12-09 DIAGNOSIS — R208 Other disturbances of skin sensation: Secondary | ICD-10-CM

## 2024-12-09 DIAGNOSIS — L291 Pruritus scroti: Secondary | ICD-10-CM | POA: Diagnosis not present

## 2024-12-09 DIAGNOSIS — Z79899 Other long term (current) drug therapy: Secondary | ICD-10-CM

## 2024-12-09 DIAGNOSIS — Z7189 Other specified counseling: Secondary | ICD-10-CM

## 2024-12-09 DIAGNOSIS — L281 Prurigo nodularis: Secondary | ICD-10-CM | POA: Diagnosis not present

## 2024-12-09 DIAGNOSIS — L299 Pruritus, unspecified: Secondary | ICD-10-CM

## 2024-12-09 MED ORDER — NEMOLIZUMAB-ILTO 30 MG ~~LOC~~ AUIJ: 30.0000 mg | AUTO-INJECTOR | SUBCUTANEOUS | 4 refills | Status: DC

## 2024-12-09 MED ORDER — NEMOLIZUMAB-ILTO 30 MG ~~LOC~~ AUIJ: 60.0000 mg | AUTO-INJECTOR | Freq: Once | SUBCUTANEOUS | 0 refills | Status: AC

## 2024-12-09 MED ORDER — NEMLUVIO 30 MG ~~LOC~~ AUIJ: 60.0000 mg | AUTO-INJECTOR | SUBCUTANEOUS | 4 refills | Status: AC

## 2024-12-09 MED ORDER — CLOBETASOL PROPIONATE 0.05 % EX OINT: TOPICAL_OINTMENT | CUTANEOUS | 2 refills | Status: AC

## 2024-12-09 NOTE — Progress Notes (Addendum)
 "  Follow-Up Visit   Subjective  Johnny Banks is a 64 y.o. male who presents for the following: 5 week follow up. 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  Using Mupirocin  ointment and clobetasol  ointment as directed. Taking Fluconazole  200 mg once a week since last visit.  States he is also taking a medication twice a day but it unsure of the name of the medication. PCP has prescribed Doxycycline 100 mg to take twice a day.  The following portions of the chart were reviewed this encounter and updated as appropriate: medications, allergies, medical history  Review of Systems:  No other skin or systemic complaints except as noted in HPI or Assessment and Plan.  Objective  Well appearing patient in no apparent distress; mood and affect are within normal limits.  A focused examination was performed of the following areas: Groin   Relevant exam findings are noted in the Assessment and Plan.    Assessment & Plan    Pruritus of scroti with lichenification and fissures and burning sensation and pain  (PRURIGO NODULARIS) And intertrigo  Extremely symptomatic Pt missed last follow up appt Exam:  Dyschromia  and lichenification of scrotum, exaggerated crevices of scrotum, no crusting or weeping areas. Has improved since last visit; few cysts of scrotum. No evidence of infection.  Chronic and persistent condition with duration or expected duration over one year. Condition is improving with treatment but not currently at goal.  Intertrigo is a chronic recurrent rash that occurs in skin fold areas that may be associated with friction; heat; moisture; yeast; fungus; and bacteria.  It is exacerbated by increased movement / activity; sweating; and higher atmospheric temperature.  Use of an absorbant powder such as Zeasorb AF powder or other OTC antifungal powder to the area daily can prevent rash recurrence. Other options to help keep the area dry include blow drying the area after bathing or  using antiperspirant products such as Duradry sweat minimizing gel.   Discussed with patient that condition is treatable, but not curable.  Goal is better control.     Continue Mupirocin  2 % ointment - apply topically to split skin on scrotum daily for fissures to prevent infection.   Continue Clobetasol  0.05 % ointment - apply topically to scrotum daily for rash - will help with itch and rash and decrease thickening of scrotum skin (lichenification and Lichen simplex changes)     Plan to start Nemluvio  pending insurance approval. Loading dose samples not available in office today.    Topical steroids (such as triamcinolone, fluocinolone, fluocinonide, mometasone, clobetasol , halobetasol, betamethasone, hydrocortisone) can cause thinning and lightening of the skin if they are used for too long in the same area. Your physician has selected the right strength medicine for your problem and area affected on the body. Please use your medication only as directed by your physician to prevent side effects.  PRURITUS   This Visit - clobetasol  ointment (TEMOVATE ) 0.05 % - Apply topically  daily to scrotum for rash Existing Treatments - mupirocin  ointment (BACTROBAN ) 2 % - Apply topically to cracked skin at scrotum daily - fluconazole  (DIFLUCAN ) 200 MG tablet - Take 1 tablet (200 mg total) by mouth once a week. For rash at scrotum PRURITUS SCROTI   PRURIGO NODULARIS   BURNING SENSATION   SKIN PAIN   COUNSELING AND COORDINATION OF CARE   MEDICATION MANAGEMENT   LONG-TERM USE OF HIGH-RISK MEDICATION    Return in about 4 weeks (around 01/06/2025) for Rash Follow Up.  I, Jill Parcell, CMA, am acting as scribe for Alm Rhyme, MD.   Documentation: I have reviewed the above documentation for accuracy and completeness, and I agree with the above.  Alm Rhyme, MD    "

## 2024-12-09 NOTE — Addendum Note (Signed)
 Addended by: Mahi Zabriskie on: 12/09/2024 01:39 PM   Modules accepted: Orders

## 2024-12-09 NOTE — Patient Instructions (Addendum)
 Continue Mupirocin  2 % ointment - apply topically to split skin on scrotum daily for fissures to prevent infection.   Continue Clobetasol  0.05 % ointment - apply topically to scrotum daily for rash - will help with itch and rash and decrease thickening of scrotum skin (lichenification and Lichen simplex changes) Avoid applying to face, groin, and axilla. Use as directed. Long-term use can cause thinning of the skin.    Topical steroids (such as triamcinolone, fluocinolone, fluocinonide, mometasone, clobetasol , halobetasol, betamethasone, hydrocortisone) can cause thinning and lightening of the skin if they are used for too long in the same area. Your physician has selected the right strength medicine for your problem and area affected on the body. Please use your medication only as directed by your physician to prevent side effects.

## 2025-01-06 ENCOUNTER — Ambulatory Visit: Admitting: Dermatology
